# Patient Record
Sex: Female | Born: 1945 | Race: Black or African American | Hispanic: No | Marital: Married | Smoking: Never smoker
Health system: Southern US, Community
[De-identification: ages and names within clinical notes are randomized; demographics above are authoritative.]

## PROBLEM LIST (undated history)

## (undated) DIAGNOSIS — Z860100 Personal history of colon polyps, unspecified: Secondary | ICD-10-CM

## (undated) DIAGNOSIS — G250 Essential tremor: Secondary | ICD-10-CM

## (undated) DIAGNOSIS — H269 Unspecified cataract: Secondary | ICD-10-CM

## (undated) DIAGNOSIS — R7301 Impaired fasting glucose: Secondary | ICD-10-CM

## (undated) DIAGNOSIS — I10 Essential (primary) hypertension: Secondary | ICD-10-CM

## (undated) DIAGNOSIS — M1990 Unspecified osteoarthritis, unspecified site: Secondary | ICD-10-CM

## (undated) DIAGNOSIS — K219 Gastro-esophageal reflux disease without esophagitis: Secondary | ICD-10-CM

## (undated) HISTORY — DX: Personal history of colonic polyps: Z86.010

## (undated) HISTORY — DX: Unspecified osteoarthritis, unspecified site: M19.90

## (undated) HISTORY — PX: TUBAL LIGATION: SHX77

## (undated) HISTORY — DX: Essential tremor: G25.0

## (undated) HISTORY — PX: ABDOMINAL HYSTERECTOMY: SHX81

## (undated) HISTORY — DX: Personal history of colon polyps, unspecified: Z86.0100

## (undated) HISTORY — DX: Impaired fasting glucose: R73.01

## (undated) HISTORY — DX: Essential (primary) hypertension: I10

## (undated) HISTORY — PX: COLONOSCOPY: SHX174

## (undated) HISTORY — DX: Unspecified cataract: H26.9

## (undated) HISTORY — DX: Gastro-esophageal reflux disease without esophagitis: K21.9

---

## 1990-11-03 HISTORY — PX: CERVICAL DISCECTOMY: SHX98

## 2005-02-06 ENCOUNTER — Encounter: Payer: Self-pay | Admitting: Internal Medicine

## 2006-02-24 ENCOUNTER — Encounter: Payer: Self-pay | Admitting: Internal Medicine

## 2006-08-10 ENCOUNTER — Ambulatory Visit: Payer: Self-pay | Admitting: Internal Medicine

## 2006-09-29 ENCOUNTER — Ambulatory Visit: Payer: Self-pay | Admitting: Internal Medicine

## 2006-12-22 ENCOUNTER — Ambulatory Visit: Payer: Self-pay | Admitting: Internal Medicine

## 2006-12-22 LAB — CONVERTED CEMR LAB
Albumin: 3.5 g/dL (ref 3.5–5.2)
BUN: 10 mg/dL (ref 6–23)
CO2: 32 meq/L (ref 19–32)
Chloride: 103 meq/L (ref 96–112)
Creatinine, Ser: 0.8 mg/dL (ref 0.4–1.2)
GFR calc non Af Amer: 77 mL/min
Phosphorus: 3.5 mg/dL (ref 2.3–4.6)

## 2006-12-25 ENCOUNTER — Ambulatory Visit: Payer: Self-pay | Admitting: Internal Medicine

## 2006-12-25 LAB — CONVERTED CEMR LAB
Glucose, Bld: 102 mg/dL — ABNORMAL HIGH (ref 70–99)
Hgb A1c MFr Bld: 5.9 % (ref 4.6–6.0)

## 2007-03-16 ENCOUNTER — Encounter: Payer: Self-pay | Admitting: Internal Medicine

## 2007-03-16 DIAGNOSIS — M159 Polyosteoarthritis, unspecified: Secondary | ICD-10-CM | POA: Insufficient documentation

## 2007-03-16 DIAGNOSIS — I10 Essential (primary) hypertension: Secondary | ICD-10-CM | POA: Insufficient documentation

## 2007-03-23 ENCOUNTER — Ambulatory Visit: Payer: Self-pay | Admitting: Internal Medicine

## 2007-06-01 ENCOUNTER — Encounter: Admission: RE | Admit: 2007-06-01 | Discharge: 2007-06-01 | Payer: Self-pay | Admitting: Internal Medicine

## 2007-06-10 ENCOUNTER — Encounter: Admission: RE | Admit: 2007-06-10 | Discharge: 2007-06-10 | Payer: Self-pay | Admitting: Internal Medicine

## 2007-06-16 ENCOUNTER — Encounter (INDEPENDENT_AMBULATORY_CARE_PROVIDER_SITE_OTHER): Payer: Self-pay | Admitting: *Deleted

## 2007-06-24 ENCOUNTER — Ambulatory Visit: Payer: Self-pay | Admitting: Internal Medicine

## 2007-06-24 LAB — CONVERTED CEMR LAB
Albumin: 3.7 g/dL (ref 3.5–5.2)
BUN: 14 mg/dL (ref 6–23)
Calcium: 9 mg/dL (ref 8.4–10.5)
GFR calc Af Amer: 93 mL/min
GFR calc non Af Amer: 77 mL/min
Phosphorus: 3.5 mg/dL (ref 2.3–4.6)
Potassium: 3.7 meq/L (ref 3.5–5.1)

## 2007-08-05 ENCOUNTER — Ambulatory Visit: Payer: Self-pay | Admitting: Internal Medicine

## 2007-09-24 ENCOUNTER — Ambulatory Visit: Payer: Self-pay | Admitting: Internal Medicine

## 2007-09-24 LAB — CONVERTED CEMR LAB
Albumin: 3.7 g/dL (ref 3.5–5.2)
BUN: 11 mg/dL (ref 6–23)
Creatinine, Ser: 0.8 mg/dL (ref 0.4–1.2)
Creatinine,U: 360.6 mg/dL
GFR calc Af Amer: 93 mL/min
GFR calc non Af Amer: 77 mL/min
Phosphorus: 3.8 mg/dL (ref 2.3–4.6)
Potassium: 3.5 meq/L (ref 3.5–5.1)
Sodium: 142 meq/L (ref 135–145)

## 2007-10-12 ENCOUNTER — Ambulatory Visit: Payer: Self-pay | Admitting: Internal Medicine

## 2007-11-09 ENCOUNTER — Telehealth (INDEPENDENT_AMBULATORY_CARE_PROVIDER_SITE_OTHER): Payer: Self-pay | Admitting: *Deleted

## 2007-12-20 ENCOUNTER — Encounter (INDEPENDENT_AMBULATORY_CARE_PROVIDER_SITE_OTHER): Payer: Self-pay | Admitting: *Deleted

## 2007-12-29 ENCOUNTER — Ambulatory Visit: Payer: Self-pay | Admitting: Internal Medicine

## 2008-03-20 ENCOUNTER — Ambulatory Visit: Payer: Self-pay | Admitting: Internal Medicine

## 2008-07-07 ENCOUNTER — Encounter: Admission: RE | Admit: 2008-07-07 | Discharge: 2008-07-07 | Payer: Self-pay | Admitting: Internal Medicine

## 2008-07-11 ENCOUNTER — Encounter: Payer: Self-pay | Admitting: Internal Medicine

## 2008-07-11 ENCOUNTER — Encounter (INDEPENDENT_AMBULATORY_CARE_PROVIDER_SITE_OTHER): Payer: Self-pay | Admitting: *Deleted

## 2008-08-25 ENCOUNTER — Ambulatory Visit: Payer: Self-pay | Admitting: Internal Medicine

## 2008-08-25 DIAGNOSIS — R7301 Impaired fasting glucose: Secondary | ICD-10-CM

## 2008-08-25 DIAGNOSIS — R7303 Prediabetes: Secondary | ICD-10-CM | POA: Insufficient documentation

## 2008-08-28 LAB — CONVERTED CEMR LAB
ALT: 41 units/L — ABNORMAL HIGH (ref 0–35)
AST: 41 units/L — ABNORMAL HIGH (ref 0–37)
Albumin: 3.8 g/dL (ref 3.5–5.2)
Alkaline Phosphatase: 66 units/L (ref 39–117)
Basophils Absolute: 0 10*3/uL (ref 0.0–0.1)
Basophils Relative: 0.2 % (ref 0.0–3.0)
CO2: 32 meq/L (ref 19–32)
Calcium: 9.2 mg/dL (ref 8.4–10.5)
Cholesterol: 169 mg/dL (ref 0–200)
Creatinine, Ser: 0.8 mg/dL (ref 0.4–1.2)
Glucose, Bld: 96 mg/dL (ref 70–99)
LDL Cholesterol: 98 mg/dL (ref 0–99)
Lymphocytes Relative: 31.4 % (ref 12.0–46.0)
MCHC: 33.8 g/dL (ref 30.0–36.0)
Monocytes Relative: 9.9 % (ref 3.0–12.0)
Neutrophils Relative %: 56.3 % (ref 43.0–77.0)
RBC: 4.4 M/uL (ref 3.87–5.11)
Total Bilirubin: 0.7 mg/dL (ref 0.3–1.2)
Total CHOL/HDL Ratio: 3.4
VLDL: 22 mg/dL (ref 0–40)

## 2008-10-26 ENCOUNTER — Telehealth (INDEPENDENT_AMBULATORY_CARE_PROVIDER_SITE_OTHER): Payer: Self-pay | Admitting: *Deleted

## 2008-11-14 ENCOUNTER — Ambulatory Visit: Payer: Self-pay | Admitting: Internal Medicine

## 2009-03-21 ENCOUNTER — Ambulatory Visit: Payer: Self-pay | Admitting: Internal Medicine

## 2009-03-21 ENCOUNTER — Telehealth: Payer: Self-pay | Admitting: Internal Medicine

## 2009-03-22 LAB — CONVERTED CEMR LAB
ALT: 34 units/L (ref 0–35)
AST: 37 units/L (ref 0–37)
Albumin: 3.9 g/dL (ref 3.5–5.2)
Alkaline Phosphatase: 75 units/L (ref 39–117)
CO2: 30 meq/L (ref 19–32)
Calcium: 9.1 mg/dL (ref 8.4–10.5)
Chloride: 109 meq/L (ref 96–112)
Potassium: 3.3 meq/L — ABNORMAL LOW (ref 3.5–5.1)
Total Protein: 7.6 g/dL (ref 6.0–8.3)

## 2009-04-05 ENCOUNTER — Ambulatory Visit: Payer: Self-pay | Admitting: Internal Medicine

## 2009-04-05 ENCOUNTER — Telehealth: Payer: Self-pay | Admitting: Internal Medicine

## 2009-04-05 LAB — CONVERTED CEMR LAB: Potassium: 4.1 meq/L (ref 3.5–5.1)

## 2009-07-02 ENCOUNTER — Telehealth: Payer: Self-pay | Admitting: Internal Medicine

## 2009-07-12 ENCOUNTER — Encounter: Admission: RE | Admit: 2009-07-12 | Discharge: 2009-07-12 | Payer: Self-pay | Admitting: Internal Medicine

## 2009-07-13 ENCOUNTER — Encounter: Payer: Self-pay | Admitting: Internal Medicine

## 2009-08-15 ENCOUNTER — Ambulatory Visit: Payer: Self-pay | Admitting: Internal Medicine

## 2009-09-19 ENCOUNTER — Ambulatory Visit: Payer: Self-pay | Admitting: Internal Medicine

## 2009-09-25 LAB — CONVERTED CEMR LAB
ALT: 40 units/L — ABNORMAL HIGH (ref 0–35)
AST: 40 units/L — ABNORMAL HIGH (ref 0–37)
Bilirubin, Direct: 0.1 mg/dL (ref 0.0–0.3)
CO2: 32 meq/L (ref 19–32)
Chloride: 104 meq/L (ref 96–112)
Creatinine, Ser: 0.8 mg/dL (ref 0.4–1.2)
Eosinophils Relative: 1.4 % (ref 0.0–5.0)
GFR calc non Af Amer: 92.5 mL/min (ref >60)
HCT: 38.8 % (ref 36.0–46.0)
HDL: 45.9 mg/dL (ref >39.00)
Lymphs Abs: 1.5 10*3/uL (ref 0.7–4.0)
Monocytes Relative: 10.4 % (ref 3.0–12.0)
Platelets: 225 10*3/uL (ref 150.0–400.0)
Sodium: 143 meq/L (ref 135–145)
Total Bilirubin: 0.9 mg/dL (ref 0.3–1.2)
Total CHOL/HDL Ratio: 4
WBC: 4.6 10*3/uL (ref 4.5–10.5)

## 2009-10-23 ENCOUNTER — Ambulatory Visit: Payer: Self-pay | Admitting: Internal Medicine

## 2009-10-23 DIAGNOSIS — E876 Hypokalemia: Secondary | ICD-10-CM

## 2009-10-24 LAB — CONVERTED CEMR LAB: Potassium: 3.5 meq/L (ref 3.5–5.1)

## 2009-11-02 ENCOUNTER — Encounter: Payer: Self-pay | Admitting: Internal Medicine

## 2010-03-12 ENCOUNTER — Encounter: Payer: Self-pay | Admitting: Internal Medicine

## 2010-03-13 ENCOUNTER — Encounter (INDEPENDENT_AMBULATORY_CARE_PROVIDER_SITE_OTHER): Payer: Self-pay | Admitting: *Deleted

## 2010-03-13 ENCOUNTER — Ambulatory Visit: Payer: Self-pay | Admitting: Internal Medicine

## 2010-03-13 DIAGNOSIS — R079 Chest pain, unspecified: Secondary | ICD-10-CM

## 2010-03-15 LAB — CONVERTED CEMR LAB
ALT: 42 units/L — ABNORMAL HIGH (ref 0–35)
AST: 51 units/L — ABNORMAL HIGH (ref 0–37)
Albumin: 3.9 g/dL (ref 3.5–5.2)
BUN: 17 mg/dL (ref 6–23)
Basophils Absolute: 0 10*3/uL (ref 0.0–0.1)
CO2: 32 meq/L (ref 19–32)
Calcium: 9 mg/dL (ref 8.4–10.5)
Creatinine, Ser: 0.8 mg/dL (ref 0.4–1.2)
Eosinophils Relative: 2.8 % (ref 0.0–5.0)
Monocytes Absolute: 0.5 10*3/uL (ref 0.1–1.0)
Monocytes Relative: 10.4 % (ref 3.0–12.0)
Neutrophils Relative %: 53.4 % (ref 43.0–77.0)
Platelets: 220 10*3/uL (ref 150.0–400.0)
TSH: 1.08 microintl units/mL (ref 0.35–5.50)
Total Protein: 6.8 g/dL (ref 6.0–8.3)
WBC: 4.4 10*3/uL — ABNORMAL LOW (ref 4.5–10.5)

## 2010-04-11 ENCOUNTER — Encounter (INDEPENDENT_AMBULATORY_CARE_PROVIDER_SITE_OTHER): Payer: Self-pay

## 2010-04-16 ENCOUNTER — Ambulatory Visit: Payer: Self-pay | Admitting: Internal Medicine

## 2010-04-30 ENCOUNTER — Ambulatory Visit: Payer: Self-pay | Admitting: Internal Medicine

## 2010-05-05 ENCOUNTER — Encounter: Payer: Self-pay | Admitting: Internal Medicine

## 2010-07-16 ENCOUNTER — Encounter: Admission: RE | Admit: 2010-07-16 | Discharge: 2010-07-16 | Payer: Self-pay | Admitting: Internal Medicine

## 2010-07-17 ENCOUNTER — Encounter: Payer: Self-pay | Admitting: Internal Medicine

## 2010-08-07 ENCOUNTER — Ambulatory Visit: Payer: Self-pay | Admitting: Internal Medicine

## 2010-10-01 ENCOUNTER — Ambulatory Visit: Payer: Self-pay | Admitting: Internal Medicine

## 2010-10-02 LAB — CONVERTED CEMR LAB
AST: 36 units/L (ref 0–37)
Alkaline Phosphatase: 57 units/L (ref 39–117)
BUN: 19 mg/dL (ref 6–23)
Basophils Absolute: 0 10*3/uL (ref 0.0–0.1)
Bilirubin, Direct: 0.1 mg/dL (ref 0.0–0.3)
Chloride: 103 meq/L (ref 96–112)
Eosinophils Absolute: 0.1 10*3/uL (ref 0.0–0.7)
GFR calc non Af Amer: 96.37 mL/min (ref >60)
Glucose, Bld: 104 mg/dL — ABNORMAL HIGH (ref 70–99)
Hemoglobin: 12.1 g/dL (ref 12.0–15.0)
Hgb A1c MFr Bld: 5.9 % (ref 4.6–6.5)
Lymphocytes Relative: 35.1 % (ref 12.0–46.0)
Monocytes Relative: 9.5 % (ref 3.0–12.0)
Neutro Abs: 2.4 10*3/uL (ref 1.4–7.7)
Neutrophils Relative %: 51.4 % (ref 43.0–77.0)
Phosphorus: 3.3 mg/dL (ref 2.3–4.6)
Potassium: 3.5 meq/L (ref 3.5–5.1)
RDW: 13.8 % (ref 11.5–14.6)
TSH: 0.58 microintl units/mL (ref 0.35–5.50)
Total Bilirubin: 0.5 mg/dL (ref 0.3–1.2)
Vitamin B-12: 1482 pg/mL — ABNORMAL HIGH (ref 211–911)

## 2010-11-24 ENCOUNTER — Encounter: Payer: Self-pay | Admitting: Internal Medicine

## 2010-12-03 NOTE — Miscellaneous (Signed)
Summary: Lec previsit  Clinical Lists Changes  Medications: Added new medication of MOVIPREP 100 GM  SOLR (PEG-KCL-NACL-NASULF-NA ASC-C) As per prep instructions. - Signed Rx of MOVIPREP 100 GM  SOLR (PEG-KCL-NACL-NASULF-NA ASC-C) As per prep instructions.;  #1 x 0;  Signed;  Entered by: [name] RN;  Authorized by: Iva Boop MD, FACG;  Method used: [facility]  [address], [place], [place]  [address], Ph: [phone] or [phone], Fax: [phone] Observations: Added new observation of NKA: T (04/16/2010 10:30)    Prescriptions: MOVIPREP 100 GM  SOLR (PEG-KCL-NACL-NASULF-NA ASC-C) As per prep instructions.  #1 x 0   Entered by:   [name] RN   Authorized by:   Iva Boop MD, [facility]   Signed by:   [name] RN on 04/16/2010   Method used:   [facility]ronically [facility]        [facility]  [address] (retail)       [address]       [place], [place]  [address]       Ph: [phone] or [phone]       Fax: [phone]   RxID:   [id number]

## 2010-12-03 NOTE — Assessment & Plan Note (Signed)
Summary: FLU SHOT/CLE  Nurse Visit   Allergies: No Known Drug Allergies  Orders Added: 1)  Flu Vaccine 3yrs + MEDICARE PATIENTS [Q2039] 2)  Administration Flu vaccine - MCR [G0008]  Flu Vaccine Consent Questions     Do you have a history of severe allergic reactions to this vaccine? no    Any prior history of allergic reactions to egg and/or gelatin? no    Do you have a sensitivity to the preservative Thimersol? no    Do you have a past history of Guillan-Barre Syndrome? no    Do you currently have an acute febrile illness? no    Have you ever had a severe reaction to latex? no    Vaccine information given and explained to patient? yes    Are you currently pregnant? no    Lot Number:[id number]   Exp Date:05/03/2011   Site Given  Left Deltoid IMu  

## 2010-12-03 NOTE — Medication Information (Signed)
Summary: Letter Regarding ARB & ACE-I Combination Use/[facility]  Letter Regarding ARB & ACE-I Combination Use/[facility]   Imported By: Lanelle Bal 11/07/2009 09:56:45  _____________________________________________________________________  External Attachment:    Type:   Image     Comment:   External Document  Appended Document: Letter Regarding ARB & ACE-I Combination Use/[facility] will discuss stopping the benazapril at next visit if BP is still okay

## 2010-12-03 NOTE — Procedures (Signed)
Summary: Colonoscopy  Patient: Kathy Acosta Note: All result statuses are Final unless otherwise noted.  Tests: (1) Colonoscopy (COL)   COL Colonoscopy           DONE     [facility]     [address]     [place], [place]  [address]           COLONOSCOPY PROCEDURE REPORT           PATIENT:  Kathy, Acosta  MR#:  [id number]     BIRTHDATE:  06-21-1946, 63 yrs. old  GENDER:  female     ENDOSCOPIST:  Iva Boop, MD, [facility]     REF. BY:  Tillman Abide, M.D.     PROCEDURE DATE:  04/30/2010     PROCEDURE:  Colonoscopy with snare polypectomy     ASA CLASS:  Class II     INDICATIONS:  surveillance and high-risk screening, history of     polyps, family history of colon cancer personal hx of diminutive     polyp removal 4/06 (no pathology)     sister(60's), aunt (40-50's) and uncle (27's) all with colon     cancer     MEDICATIONS:   Fentanyl 75 mcg IV, Versed 6 mg IV           DESCRIPTION OF PROCEDURE:   After the risks benefits and     alternatives of the procedure were thoroughly explained, informed     consent was obtained.  Digital rectal exam was performed and     revealed no abnormalities.   The LB [id number] endoscope     was introduced through the anus and advanced to the cecum, which     was identified by both the appendix and ileocecal valve, without     limitations.  The quality of the prep was excellent, using     MoviPrep.  The instrument was then slowly withdrawn as the colon     was fully examined. Insertion: 4:05 minutes and Withdrawal: 10:28     minutes.     <<PROCEDUREIMAGES>>           FINDINGS:  A diminutive polyp was found in the descending colon.     It was 5 mm in size. Polyp was snared without cautery. Retrieval     was successful. snare polyp  Scattered diverticula were found     throughout the colon.  This was otherwise a normal examination of     the colon.   Retroflexed views in the rectum revealed no     abnormalities.    The scope was  then withdrawn from the patient     and the procedure completed.           COMPLICATIONS:  None     ENDOSCOPIC IMPRESSION:     1) 5 mm diminutive polyp in the descending colon - removed     2) Diverticula, scattered throughout the colon     3) Otherwise normal examination - excellent prep     4) Personal history of diminutive polyp removal 2006     5) Family history of colon cancer (sister, aunt and uncle)           REPEAT EXAM:  In for Colonoscopy, pending biopsy results. Likely     in 5 years           Iva Boop, MD, [facility]           CC:  Karie Schwalbe, MD     The Patient           n.     eSIGNED:   Iva Boop at 04/30/2010 11:36 AM           Isabella Bowens, [phone]  Note: An exclamation mark (!) indicates a result that was not dispersed into the flowsheet. Document Creation Date: 04/30/2010 11:36 AM _______________________________________________________________________  (1) Order result status: Final Collection or observation date-time: 04/30/2010 11:26 Requested date-time:  Receipt date-time:  Reported date-time:  Referring Physician:   Ordering Physician: [place] Head [phone]) Specimen Source:  Source: Launa Grill Order Number: [id number] Lab site:   Appended Document: Colonoscopy     Procedures Next Due Date:    Colonoscopy: 04/2015

## 2010-12-03 NOTE — Assessment & Plan Note (Signed)
Summary: CPX/RBH   Vital Signs:  Patient profile:   65 year old female Weight:      166 pounds Temp:     99.0 degrees F oral Pulse rate:   72 / minute Pulse rhythm:   regular BP sitting:   140 / 80  (left arm) Cuff size:   regular  Vitals Entered By: Mervin Hack CMA Duncan Dull) (October 01, 2010 9:50 AM) CC: adult physical   History of Present Illness: DOing well just had colonoscopy Mammo done a few months ago  Still with mild tremor Can be a problem when using hands--but not consistently no functional problems  Allergies: No Known Drug Allergies  Past History:  Past medical, surgical, family and social histories (including risk factors) reviewed for relevance to current acute and chronic problems.  Past Medical History: Reviewed history from 03/13/2010 and no changes required. Hypertension Osteoarthritis Impaired fasting glucose Colonic polyps, hx of  Past Surgical History: Reviewed history from 03/16/2007 and no changes required. Cervical diskectomy 1992 C-section x 2 Hysterectomy 1990's Thallium stress negative EF 69% 12/05 DEXA- mild osteopenia spine 08/01  Family History: Reviewed history from 12/29/2007 and no changes required. Father: Died at age 21, old age, HTN Mother: Died at age 70, HTN Siblings: 8 brothers, one died of leukemia, one died DM complications              4 sisters. 1 with GI stromal cell tumor HBP--  Parents DM-- Dad and 2 brothers Colon cancer-- Pat. aunt No brreast cancer  Social History: Marital Status: widowed 1999 Children: 2 sons, 7 grandchildren Occupation: Retired, Clinical biochemist, [employer] Never Smoked Alcohol use-yes--occ wine Walks 3 miles --3 times weekly  Review of Systems General:  weight down 4#--she wasn't aware No regular exercise sleeps okay in general--occ bad night always wears seat belt. Eyes:  Denies double vision and vision loss-1 eye. ENT:  Denies decreased hearing and ringing in ears;  teeth okay--regular with dentist. CV:  Complains of shortness of breath with exertion; denies chest pain or discomfort, difficulty breathing at night, difficulty breathing while lying down, fainting, lightheadness, and palpitations; stable DOE . Resp:  Denies cough and shortness of breath. GI:  Denies abdominal pain, bloody stools, change in bowel habits, dark tarry stools, indigestion, nausea, and vomiting. GU:  Denies dysuria and incontinence; no problems on self breast exam no sexual problems. MS:  Complains of joint pain; denies joint swelling; some pain in hands--esp right thumb. Derm:  Denies lesion(s) and rash. Neuro:  Complains of tremors and weakness; denies headaches, numbness, and tingling; brief left leg weakness--seemed to be related to hip pain. Psych:  Denies anxiety and depression. Heme:  Complains of abnormal bruising; denies enlarge lymph nodes; bruises easy--no change. Allergy:  Denies seasonal allergies and sneezing; may have some mild symptoms--no meds.  Physical Exam  General:  alert and normal appearance.   Eyes:  pupils equal, pupils round, pupils reactive to light, and no optic disk abnormalities.   Ears:  R ear normal and L ear normal.   Mouth:  no erythema, no exudates, and no lesions.   Neck:  supple, no masses, no thyromegaly, no carotid bruits, and no cervical lymphadenopathy.   Breasts:  no masses, no abnormal thickening, no tenderness, and no adenopathy.   Lungs:  normal respiratory effort, no intercostal retractions, no accessory muscle use, and normal breath sounds.   Heart:  normal rate, regular rhythm, no murmur, and no gallop.   Abdomen:  soft, non-tender,  and no masses.   Msk:  no joint tenderness and no joint swelling.   Pulses:  1+ in feet Extremities:  no edema Neurologic:  alert & oriented X3, strength normal in all extremities, and gait normal.   Skin:  no rashes and no suspicious lesions.   Multiple seb keratoses and several benign  nevi Psych:  normally interactive, good eye contact, not anxious appearing, and not depressed appearing.     Impression & Recommendations:  Problem # 1:  HEALTH MAINTENANCE EXAM (ICD-V70.0) Assessment Comment Only up to date on cancer screening and imms discussed fitness  Problem # 2:  HYPERTENSION (ICD-401.9) Assessment: Unchanged good control no changes needed  Her updated medication list for this problem includes:    Metoprolol Succinate 100 Mg Xr24h-tab (Metoprolol succinate) .Marland Kitchen... 1 daily for high blood pressure    Losartan Potassium-hctz 100-12.5 Mg Tabs (Losartan potassium-hctz) .Marland Kitchen... 1 tab daily for high blood pressure    Doxazosin Mesylate 4 Mg Tabs (Doxazosin mesylate) .Marland Kitchen... Take one by mouth daily    Amlodipine Besy-benazepril Hcl 10-20 Mg Caps (Amlodipine besy-benazepril hcl) .Marland Kitchen... Take one by mouth daily  Orders: TLB-Renal Function Panel (80069-RENAL) TLB-CBC Platelet - w/Differential (85025-CBCD) TLB-Hepatic/Liver Function Pnl (80076-HEPATIC) TLB-TSH (Thyroid Stimulating Hormone) (84443-TSH) Venipuncture ([address])  BP today: 140/80 Prior BP: 128/80 (03/13/2010)  Prior 10 Yr Risk Heart Disease: 8 % (11/14/2008)  Labs Reviewed: K+: 3.3 (03/13/2010) Creat: : 0.8 (03/13/2010)   Chol: 171 (09/19/2009)   HDL: 45.90 (09/19/2009)   LDL: 98 (09/19/2009)   TG: 138.0 (09/19/2009)  Problem # 3:  OSTEOARTHRITIS (ICD-715.90) Assessment: Comment Only discussed tylenol as needed   Problem # 4:  TREMOR (ICD-781.0) Assessment: Comment Only mild and non progressive at this point  Orders: TLB-B12, Serum-Total ONLY (09326-Z12)  Complete Medication List: 1)  Metoprolol Succinate 100 Mg Xr24h-tab (Metoprolol succinate) .Marland Kitchen.. 1 daily for high blood pressure 2)  Losartan Potassium-hctz 100-12.5 Mg Tabs (Losartan potassium-hctz) .Marland Kitchen.. 1 tab daily for high blood pressure 3)  Doxazosin Mesylate 4 Mg Tabs (Doxazosin mesylate) .... Take one by mouth daily 4)  Amlodipine  Besy-benazepril Hcl 10-20 Mg Caps (Amlodipine besy-benazepril hcl) .... Take one by mouth daily 5)  Aspirin 81 Mg Tabs (Aspirin) .... Take one by mouth daily 6)  Vitamin C Cr 500 Mg Cpcr (Ascorbic acid) .... Take one by mouth daily 7)  Oscal 500/200 D-3 500-200 Mg-unit Tabs (Calcium-vitamin d) .... Take one by mouth daily 8)  Glucosamine-chondroitin Tabs (Glucosamine-chondroit-vit c-mn) .... Take 2 by mouth daily 9)  Ultra-mega Cr-tabs (Multiple vitamins-minerals) .... Take 2 by mouth once daily 10)  Fish Oil 1000 Mg Caps (Omega-3 fatty acids) .... Take 1 by mouth once daily  Other Orders: TLB-A1C / Hgb A1C (Glycohemoglobin) (83036-A1C)  Patient Instructions: 1)  Please schedule a follow-up appointment in 6 months .  Prescriptions: AMLODIPINE BESY-BENAZEPRIL HCL 10-20 MG CAPS (AMLODIPINE BESY-BENAZEPRIL HCL) Take one by mouth daily  #90 x 3   Entered by:   Mervin Hack CMA (AAMA)   Authorized by:   Cindee Salt MD   Signed by:   Mervin Hack CMA (AAMA) on 10/01/2010   Method used:   Electronically to        [facility] (retail)             ,          Ph: [phone]       Fax: [phone]   RxID:   [id number] DOXAZOSIN MESYLATE 4 MG TABS (DOXAZOSIN MESYLATE) Take one  by mouth daily  #90 x 3   Entered by:   Mervin Hack CMA (AAMA)   Authorized by:   Cindee Salt MD   Signed by:   Mervin Hack CMA (AAMA) on 10/01/2010   Method used:   Electronically to        [facility] (retail)             ,          Ph: [phone]       Fax: [phone]   RxID:   [id number] LOSARTAN POTASSIUM-HCTZ 100-12.5 MG TABS (LOSARTAN POTASSIUM-HCTZ) 1 tab daily for high blood pressure  #90 x 3   Entered by:   Mervin Hack CMA (AAMA)   Authorized by:   Cindee Salt MD   Signed by:   Mervin Hack CMA (AAMA) on 10/01/2010   Method used:   Electronically to        [facility] (retail)             ,          Ph: [phone]       Fax:  [phone]   RxID:   [id number] METOPROLOL SUCCINATE 100 MG XR24H-TAB (METOPROLOL SUCCINATE) 1 daily for high blood pressure  #90 x 3   Entered by:   Mervin Hack CMA (AAMA)   Authorized by:   Cindee Salt MD   Signed by:   Mervin Hack CMA (AAMA) on 10/01/2010   Method used:   Electronically to        [facility] (retail)             ,          Ph: [phone]       Fax: [phone]   RxID:   [id number]    Orders Added: 1)  TLB-B12, Serum-Total ONLY [25427-C62] 2)  TLB-Renal Function Panel [80069-RENAL] 3)  TLB-CBC Platelet - w/Differential [85025-CBCD] 4)  TLB-Hepatic/Liver Function Pnl [80076-HEPATIC] 5)  TLB-TSH (Thyroid Stimulating Hormone) [84443-TSH] 6)  Venipuncture [36415] 7)  Est. Patient 65& > [99397] 8)  TLB-A1C / Hgb A1C (Glycohemoglobin) [83036-A1C]    Current Allergies (reviewed today): No known allergies

## 2010-12-03 NOTE — Letter (Signed)
Summary: Patient Notice-Hyperplastic Polyps  [facility]  [address] [place], [place] [address]   Phone: [phone]  Fax: [phone]        May 05, 2010 MRN: [id number]    [facility] [address] [place], [place]  [address]    Dear Ms. Srinivasan,  I am pleased to inform you that the suspected colon polyp removed during your recent colonoscopy was not actually a polyp. It was NOT pre-cancerous.  It is therefore my recommendation that you have a repeat colonoscopy examination in 5 years for routine colorectal cancer screening.   Should you develop new or worsening symptoms of abdominal pain, bowel habit changes or bleeding from the rectum or bowels, please schedule an evaluation with either your primary care physician or with me.  Please call us if you are having persistent problems or have questions about your condition that have not been fully answered at this time.   Sincerely,  Iva Boop MD, [facility] This letter has been electronically signed by your physician.  Appended Document: Patient Notice-Hyperplastic Polyps letter mailed 7.6.11

## 2010-12-03 NOTE — Assessment & Plan Note (Signed)
Summary: 6 M F/U DLO   Vital Signs:  Patient profile:   65 year old female Weight:      170 pounds BMI:     30.71 Temp:     98.7 degrees F oral Pulse rate:   64 / minute Pulse rhythm:   regular BP sitting:   128 / 80  (left arm) Cuff size:   regular  Vitals Entered By: Mervin Hack CMA Duncan Dull) (Mar 13, 2010 10:45 AM) CC: 6 month follow-up   History of Present Illness: Feels generally well Right shoulder feels better now  Still gets some left hip pain esp bad with prolonged sitting----takes a while to loosen up takes tylenol or advil if bad--helps Stays active in general--no restrictions "silver sneaker" program and water aerobics  Occ notes shaking of left thumb---if tries to shake someone's hand or holding a paper no other shaking No FH of tremor or Parkinsons  Bp has been good  Did get sharp chest pain about 1 month ago was expecting guests but not nervous was busy cleaning Lasted only a few seconds then eased some fluttering in chest that night no recurrence  Allergies: No Known Drug Allergies  Past History:  Past medical, surgical, family and social histories (including risk factors) reviewed for relevance to current acute and chronic problems.  Past Medical History: Reviewed history from 08/25/2008 and no changes required. Hypertension Osteoarthritis Impaired fasting glucose  Past Surgical History: Reviewed history from 03/16/2007 and no changes required. Cervical diskectomy 1992 C-section x 2 Hysterectomy 1990's Thallium stress negative EF 69% 12/05 DEXA- mild osteopenia spine 08/01  Family History: Reviewed history from 12/29/2007 and no changes required. Father: Died at age 77, old age, HTN Mother: Died at age 85, HTN Siblings: 8 brothers, one died of leukemia, one died DM complications              4 sisters. 1 with GI stromal cell tumor HBP--  Parents DM-- Dad and 2 brothers Colon cancer-- Pat. aunt No brreast cancer  Social  History: Reviewed history from 03/20/2008 and no changes required. Marital Status: widowed 1999 Children: 2 sons, 6 granddaughters Occupation: Retired, Clinical biochemist, Cont. [employer] Never Smoked Alcohol use-yes--occ wine Walks 3 miles --3 times weekly  Review of Systems       sleeps fine appetite is normal weight is stable  Physical Exam  General:  alert and normal appearance.   Neck:  supple, no masses, no thyromegaly, no carotid bruits, and no cervical lymphadenopathy.   Lungs:  normal respiratory effort and normal breath sounds.   Heart:  normal rate, regular rhythm, no murmur, and no gallop.   Abdomen:  soft and non-tender.   Msk:  no joint tenderness and no joint swelling.   Extremities:  no edema Neurologic:  slight tremor of left thumb when holding paper normal tone Psych:  normally interactive, good eye contact, not anxious appearing, and not depressed appearing.     Impression & Recommendations:  Problem # 1:  CHEST PAIN (ICD-786.50) Assessment New  very atypical  EKG not worrisome discussed typical symtoms  Orders: TLB-TSH (Thyroid Stimulating Hormone) (84443-TSH) TLB-CBC Platelet - w/Differential (85025-CBCD) EKG w/ Interpretation (93000)  Problem # 2:  TREMOR (ICD-781.0) Assessment: New very mild and focal will just observe  Problem # 3:  HYPERTENSION (ICD-401.9) Assessment: Unchanged  good control no changes needed  Her updated medication list for this problem includes:    Metoprolol Succinate 100 Mg Xr24h-tab (Metoprolol succinate) .Marland Kitchen... 1 daily for high  blood pressure    Losartan Potassium-hctz 100-12.5 Mg Tabs (Losartan potassium-hctz) .Marland Kitchen... 1 tab daily for high blood pressure    Doxazosin Mesylate 4 Mg Tabs (Doxazosin mesylate) .Marland Kitchen... Take one by mouth daily    Amlodipine Besy-benazepril Hcl 10-20 Mg Caps (Amlodipine besy-benazepril hcl) .Marland Kitchen... Take one by mouth daily  BP today: 128/80 Prior BP: 130/80 (09/19/2009)  Prior 10 Yr Risk  Heart Disease: 8 % (11/14/2008)  Labs Reviewed: K+: 3.5 (10/23/2009) Creat: : 0.8 (09/19/2009)   Chol: 171 (09/19/2009)   HDL: 45.90 (09/19/2009)   LDL: 98 (09/19/2009)   TG: 138.0 (09/19/2009)  Orders: TLB-Hepatic/Liver Function Pnl (80076-HEPATIC) Venipuncture ([address])  Complete Medication List: 1)  Metoprolol Succinate 100 Mg Xr24h-tab (Metoprolol succinate) .Marland Kitchen.. 1 daily for high blood pressure 2)  Losartan Potassium-hctz 100-12.5 Mg Tabs (Losartan potassium-hctz) .Marland Kitchen.. 1 tab daily for high blood pressure 3)  Doxazosin Mesylate 4 Mg Tabs (Doxazosin mesylate) .... Take one by mouth daily 4)  Amlodipine Besy-benazepril Hcl 10-20 Mg Caps (Amlodipine besy-benazepril hcl) .... Take one by mouth daily 5)  Aspirin 81 Mg Tabs (Aspirin) .... Take one by mouth daily 6)  Vitamin C Cr 500 Mg Cpcr (Ascorbic acid) .... Take one by mouth daily 7)  Oscal 500/200 D-3 500-200 Mg-unit Tabs (Calcium-vitamin d) .... Take one by mouth daily 8)  Glucosamine-chondroitin Tabs (Glucosamine-chondroit-vit c-mn) .... Take 2 by mouth daily 9)  Ultra-mega Cr-tabs (Multiple vitamins-minerals) .... Take 2 by mouth once daily 10)  Fish Oil 1000 Mg Caps (Omega-3 fatty acids) .... Take 1 by mouth once daily  Other Orders: TLB-Renal Function Panel (80069-RENAL)  Patient Instructions: 1)  Please schedule a follow-up appointment in 6 months for physical  Current Allergies (reviewed today): No known allergies    EKG  Procedure date:  03/13/2010  Findings:      sinus @68  Non specific T wave changes since 2007, mild lateral T wave flattening   Appended Document: Orders Update    Clinical Lists Changes  Problems: Added new problem of COLONIC POLYPS, HX OF (ICD-V12.72) Orders: Added new Referral order of Gastroenterology Referral (GI) - Signed Observations: Added new observation of PAST MED HX: Hypertension Osteoarthritis Impaired fasting glucose Colonic polyps, hx of  (03/13/2010 12:15) Added new  observation of PMH COLONPYP: yes (03/13/2010 12:15)       Past History:  Past Medical History: Hypertension Osteoarthritis Impaired fasting glucose Colonic polyps, hx of

## 2010-12-03 NOTE — Letter (Signed)
Summary: Previsit letter  [facility]  [address] [place], [place] [address]   Phone: [phone]  Fax: [phone]       03/13/2010 MRN: [id number]  [facility] [address] [place], [place]  [address]  Dear Kathy Acosta,  Welcome to the Gastroenterology Division at [facility].    You are scheduled to see a nurse for your pre-procedure visit on 04-16-10 at 10:30A.M. on the 3rd floor at [facility], [address]  We ask that you try to arrive at our office 15 minutes prior to your appointment time to allow for check-in.  Your nurse visit will consist of discussing your medical and surgical history, your immediate family medical history, and your medications.    Please bring a complete list of all your medications or, if you prefer, bring the medication bottles and we will list them.  We will need to be aware of both prescribed and over the counter drugs.  We will need to know exact dosage information as well.  If you are on blood thinners (Coumadin, Plavix, Aggrenox, Ticlid, etc.) please call our office today/prior to your appointment, as we need to consult with your physician about holding your medication.   Please be prepared to read and sign documents such as consent forms, a financial agreement, and acknowledgement forms.  If necessary, and with your consent, a friend or relative is welcome to sit-in on the nurse visit with you.  Please bring your insurance card so that we may make a copy of it.  If your insurance requires a referral to see a specialist, please bring your referral form from your primary care physician.  No co-pay is required for this nurse visit.     If you cannot keep your appointment, please call [phone] to cancel or reschedule prior to your appointment date.  This allows [place] the opportunity to schedule an appointment for another patient in need of care.    Thank you for choosing [facility] for your medical  needs.  We appreciate the opportunity to care for you.  Please visit [place] at our website  to learn more about our practice.                     Sincerely.                                                                                                                   The Gastroenterology Division

## 2010-12-03 NOTE — Letter (Signed)
Summary: Results Follow up Letter  [place] [facility]  [address] [place], [place] [address]   Phone: [phone]  Fax: [phone]    07/17/2010 MRN: [id number]  [facility] [address] [place], [place]  [address]  Dear Ms. Kahl,  The following are the results of your recent test(s):  Test         Result    Pap Smear:        Normal _____  Not Normal _____ Comments: ______________________________________________________ Cholesterol: LDL(Bad cholesterol):         Your goal is less than:         HDL (Good cholesterol):       Your goal is more than: Comments:  ______________________________________________________ Mammogram:        Normal __X___  Not Normal _____ Comments:Mammo is fine, repeat recommended in 1-2 years  ___________________________________________________________________ Hemoccult:        Normal _____  Not normal _______ Comments:    _____________________________________________________________________ Other Tests:    We routinely do not discuss normal results over the telephone.  If you desire a copy of the results, or you have any questions about this information we can discuss them at your next office visit.   Sincerely,      Tillman Abide, MD

## 2010-12-03 NOTE — Letter (Signed)
Summary: [facility] Instructions  [facility]  [address] [place], [place] [address]   Phone: [phone]  Fax: [phone]       Kathy Acosta    Jul 19, 1946    MRN: [id number]        Procedure Day Dorna Bloom:  Jake Shark  04/30/10     Arrival Time:  9:30AM     Procedure Time:  10:30AM     Location of Procedure:                    _X _  [facility] (4th Floor)                        PREPARATION FOR COLONOSCOPY WITH MOVIPREP   Starting 5 days prior to your procedure 04/25/10 do not eat nuts, seeds, popcorn, corn, beans, peas,  salads, or any raw vegetables.  Do not take any fiber supplements (e.g. Metamucil, Citrucel, and Benefiber).  THE DAY BEFORE YOUR PROCEDURE         DATE: 04/29/10  DAY: MONDAY  1.  Drink clear liquids the entire day-NO SOLID FOOD  2.  Do not drink anything colored red or purple.  Avoid juices with pulp.  No orange juice.  3.  Drink at least 64 oz. (8 glasses) of fluid/clear liquids during the day to prevent dehydration and help the prep work efficiently.  CLEAR LIQUIDS INCLUDE: Water Jello Ice Popsicles Tea (sugar ok, no milk/cream) Powdered fruit flavored drinks Coffee (sugar ok, no milk/cream) Gatorade Juice: apple, white grape, white cranberry  Lemonade Clear bullion, consomm, broth Carbonated beverages (any kind) Strained chicken noodle soup Hard Candy                             4.  In the morning, mix first dose of MoviPrep solution:    Empty 1 Pouch A and 1 Pouch B into the disposable container    Add lukewarm drinking water to the top line of the container. Mix to dissolve    Refrigerate (mixed solution should be used within 24 hrs)  5.  Begin drinking the prep at 5:00 p.m. The MoviPrep container is divided by 4 marks.   Every 15 minutes drink the solution down to the next mark (approximately 8 oz) until the full liter is complete.   6.  Follow completed prep with 16 oz of clear liquid of your choice (Nothing  red or purple).  Continue to drink clear liquids until bedtime.  7.  Before going to bed, mix second dose of MoviPrep solution:    Empty 1 Pouch A and 1 Pouch B into the disposable container    Add lukewarm drinking water to the top line of the container. Mix to dissolve    Refrigerate  THE DAY OF YOUR PROCEDURE      DATE: 04/30/10  DAY: TUESDAY  Beginning at 5:30AM (5 hours before procedure):         1. Every 15 minutes, drink the solution down to the next mark (approx 8 oz) until the full liter is complete.  2. Follow completed prep with 16 oz. of clear liquid of your choice.    3. You may drink clear liquids until 8:30AM (2 HOURS BEFORE PROCEDURE).   MEDICATION INSTRUCTIONS  Unless otherwise instructed, you should take regular prescription medications with a small sip of water   as early as possible the morning  of your procedure.      Additional medication instructions: Do not take Losartan am of procedure.         OTHER INSTRUCTIONS  You will need a responsible adult at least 67 years of age to accompany you and drive you home.   This person must remain in the waiting room during your procedure.  Wear loose fitting clothing that is easily removed.  Leave jewelry and other valuables at home.  However, you may wish to bring a book to read or  an iPod/MP3 player to listen to music as you wait for your procedure to start.  Remove all body piercing jewelry and leave at home.  Total time from sign-in until discharge is approximately 2-3 hours.  You should go home directly after your procedure and rest.  You can resume normal activities the  day after your procedure.  The day of your procedure you should not:   Drive   Make legal decisions   Operate machinery   Drink alcohol   Return to work  You will receive specific instructions about eating, activities and medications before you leave.    The above instructions have been reviewed and explained to me by    [name] RN  April 16, 2010 11:01 am    I fully understand and can verbalize these instructions _____________________________ Date _________

## 2011-03-26 ENCOUNTER — Encounter: Payer: Self-pay | Admitting: Internal Medicine

## 2011-04-01 ENCOUNTER — Encounter: Payer: Self-pay | Admitting: Internal Medicine

## 2011-04-01 ENCOUNTER — Ambulatory Visit (INDEPENDENT_AMBULATORY_CARE_PROVIDER_SITE_OTHER): Payer: MEDICARE | Admitting: Internal Medicine

## 2011-04-01 VITALS — BP 138/80 | HR 66 | Temp 98.6°F | Ht 62.0 in | Wt 168.0 lb

## 2011-04-01 DIAGNOSIS — R259 Unspecified abnormal involuntary movements: Secondary | ICD-10-CM

## 2011-04-01 DIAGNOSIS — M1990 Unspecified osteoarthritis, unspecified site: Secondary | ICD-10-CM

## 2011-04-01 DIAGNOSIS — I10 Essential (primary) hypertension: Secondary | ICD-10-CM

## 2011-04-01 NOTE — Progress Notes (Signed)
Subjective:    Patient ID: Kathy Acosta, female    DOB: 25-Sep-1946, 65 y.o.   MRN: [id number]  HPI Doing okay Generally feels great Has had a little headache over the past week though SOme pain when moving eyes No pollen sensitivity in general Has been very busy over the past month---up and back from [place] for grandaughter's graduation, etc No fever or cough  Still has mild tremor No progression Has gotten used to it No functional limitations  BP has been fine Regular with her meds No chest pain No SOB No sig exercise---will start for a while and then get sidetracked  Some left hip pain Occ takes tylenol but rarely Stays busy   Current outpatient prescriptions:amLODipine-benazepril (LOTREL) 10-20 MG per capsule, Take 1 capsule by mouth daily.  , Disp: , Rfl: ;  ascorbic acid (VITAMIN C) 500 MG tablet, Take 500 mg by mouth daily.  , Disp: , Rfl: ;  aspirin 81 MG tablet, Take 81 mg by mouth daily.  , Disp: , Rfl: ;  calcium-vitamin D (OSCAL WITH D) 500-200 MG-UNIT per tablet, Take 1 tablet by mouth daily.  , Disp: , Rfl:  doxazosin (CARDURA) 4 MG tablet, Take 4 mg by mouth at bedtime.  , Disp: , Rfl: ;  fish oil-omega-3 fatty acids 1000 MG capsule, Take 1 g by mouth daily.  , Disp: , Rfl: ;  glucosamine-chondroitin 500-400 MG tablet, Take 2 tablets by mouth daily.  , Disp: , Rfl: ;  losartan-hydrochlorothiazide (HYZAAR) 100-12.5 MG per tablet, Take 1 tablet by mouth daily.  , Disp: , Rfl:  metoprolol (TOPROL-XL) 100 MG 24 hr tablet, Take 100 mg by mouth daily.  , Disp: , Rfl: ;  Multiple Vitamins-Minerals (ULTRA MEGA PO), Take 2 capsules by mouth daily.  , Disp: , Rfl:   Past Medical History  Diagnosis Date  . Hypertension   . Arthritis   . Hx of colonic polyps   . Impaired fasting glucose     Past Surgical History  Procedure Date  . Cesarean section   . Abdominal hysterectomy   . Cervical discectomy 1992    Family History  Problem Relation Age of Onset  .  Hypertension Mother   . Hypertension Father   . Diabetes Father   . Diabetes Brother   . Cancer Paternal Aunt     colon  . Diabetes Brother     History   Social History  . Marital Status: Widowed    Spouse Name: N/A    Number of Children: 2  . Years of Education: N/A   Occupational History  . retired- Geologist, engineering. [employer]    Social History Main Topics  . Smoking status: Never Smoker   . Smokeless tobacco: Not on file  . Alcohol Use: Yes     wine  . Drug Use: Not on file  . Sexually Active: Not on file   Other Topics Concern  . Not on file   Social History Narrative   Walks 3 miles, 3 times weekly   Review of Systems Sleeps well Appetite is fine Weight is stable    Objective:   Physical Exam  Constitutional: She appears well-developed and well-nourished. No distress.  Neck: Normal range of motion. Neck supple. No thyromegaly present.  Cardiovascular: Normal rate, regular rhythm, normal heart sounds and intact distal pulses.  Exam reveals no gallop.   No murmur heard. Pulmonary/Chest: Effort normal and breath sounds normal. No respiratory distress. She has no wheezes. She  has no rales.  Abdominal: Soft. There is no tenderness.  Musculoskeletal: Normal range of motion. She exhibits no edema and no tenderness.  Lymphadenopathy:    She has no cervical adenopathy.  Neurological:       Mild resting tremor without increase with movement ??very slight bradykinesia Normal tone and strength  Psychiatric: She has a normal mood and affect. Her behavior is normal. Judgment and thought content normal.          Assessment & Plan:

## 2011-05-01 ENCOUNTER — Telehealth: Payer: Self-pay | Admitting: *Deleted

## 2011-05-01 NOTE — Telephone Encounter (Signed)
Left message on cell phone voicemail, home number just rang.   Received form from [facility] asking for a tier exception for Amlodipine/Benazepril combo drug, per Dr.Letvak he thought we had split the two up, if not we need to send in 2 separate rx's. I looked at patient's last OV on 04/01/11 and it was not split up, still listed as the combo drug. Waiting on patient to return my call.

## 2011-05-06 NOTE — Telephone Encounter (Signed)
Spoke with patient and she states it's ok to separate the medications, form on your desk. I didn't change in med list, because I wasn't sure about the dose and strength.

## 2011-05-07 NOTE — Telephone Encounter (Signed)
Okay to fill new meds at the same dosages as in the combo agent

## 2011-05-08 MED ORDER — BENAZEPRIL HCL 20 MG PO TABS: 20.0000 mg | ORAL_TABLET | Freq: Every day | ORAL | Status: DC

## 2011-05-08 MED ORDER — AMLODIPINE BESYLATE 10 MG PO TABS: 10.0000 mg | ORAL_TABLET | Freq: Every day | ORAL | Status: DC

## 2011-05-08 NOTE — Telephone Encounter (Signed)
Both rx's sent to [facility]

## 2011-06-20 ENCOUNTER — Other Ambulatory Visit: Payer: Self-pay | Admitting: Internal Medicine

## 2011-06-20 DIAGNOSIS — Z1231 Encounter for screening mammogram for malignant neoplasm of breast: Secondary | ICD-10-CM

## 2011-07-22 ENCOUNTER — Ambulatory Visit: Payer: MEDICARE

## 2011-07-28 ENCOUNTER — Ambulatory Visit
Admission: RE | Admit: 2011-07-28 | Discharge: 2011-07-28 | Disposition: A | Discharge status: Home / Self Care | Payer: Medicare Other | Source: Ambulatory Visit | Attending: Internal Medicine | Admitting: Internal Medicine

## 2011-07-28 DIAGNOSIS — Z1231 Encounter for screening mammogram for malignant neoplasm of breast: Secondary | ICD-10-CM

## 2011-08-01 ENCOUNTER — Encounter: Payer: Self-pay | Admitting: *Deleted

## 2011-09-04 ENCOUNTER — Ambulatory Visit (INDEPENDENT_AMBULATORY_CARE_PROVIDER_SITE_OTHER): Payer: Medicare Other

## 2011-09-04 DIAGNOSIS — Z23 Encounter for immunization: Secondary | ICD-10-CM

## 2011-10-07 ENCOUNTER — Encounter: Payer: Self-pay | Admitting: Internal Medicine

## 2011-10-07 ENCOUNTER — Ambulatory Visit (INDEPENDENT_AMBULATORY_CARE_PROVIDER_SITE_OTHER): Payer: Medicare Other | Admitting: Internal Medicine

## 2011-10-07 VITALS — BP 138/84 | HR 68 | Temp 98.2°F | Ht 62.0 in | Wt 172.0 lb

## 2011-10-07 DIAGNOSIS — Z0000 Encounter for general adult medical examination without abnormal findings: Secondary | ICD-10-CM | POA: Insufficient documentation

## 2011-10-07 DIAGNOSIS — R7301 Impaired fasting glucose: Secondary | ICD-10-CM

## 2011-10-07 DIAGNOSIS — M719 Bursopathy, unspecified: Secondary | ICD-10-CM

## 2011-10-07 DIAGNOSIS — I10 Essential (primary) hypertension: Secondary | ICD-10-CM

## 2011-10-07 DIAGNOSIS — M7552 Bursitis of left shoulder: Secondary | ICD-10-CM | POA: Insufficient documentation

## 2011-10-07 LAB — BASIC METABOLIC PANEL
BUN: 20 mg/dL (ref 6–23)
Calcium: 9.3 mg/dL (ref 8.4–10.5)
Creatinine, Ser: 0.9 mg/dL (ref 0.4–1.2)
GFR: 79.22 mL/min (ref >60.00)
Potassium: 3.3 mEq/L — ABNORMAL LOW (ref 3.5–5.1)

## 2011-10-07 LAB — LIPID PANEL
Cholesterol: 158 mg/dL (ref 0–200)
HDL: 57.1 mg/dL (ref >39.00)
LDL Cholesterol: 78 mg/dL (ref 0–99)
VLDL: 22.8 mg/dL (ref 0.0–40.0)

## 2011-10-07 LAB — CBC WITH DIFFERENTIAL/PLATELET
Basophils Absolute: 0 10*3/uL (ref 0.0–0.1)
Eosinophils Relative: 2.1 % (ref 0.0–5.0)
HCT: 37.9 % (ref 36.0–46.0)
Lymphocytes Relative: 35.6 % (ref 12.0–46.0)
Monocytes Relative: 10.5 % (ref 3.0–12.0)
Neutrophils Relative %: 51.2 % (ref 43.0–77.0)
Platelets: 213 10*3/uL (ref 150.0–400.0)
WBC: 4.6 10*3/uL (ref 4.5–10.5)

## 2011-10-07 LAB — HEPATIC FUNCTION PANEL
AST: 38 U/L — ABNORMAL HIGH (ref 0–37)
Alkaline Phosphatase: 57 U/L (ref 39–117)
Bilirubin, Direct: 0.1 mg/dL (ref 0.0–0.3)
Total Bilirubin: 0.4 mg/dL (ref 0.3–1.2)

## 2011-10-07 MED ORDER — METOPROLOL SUCCINATE ER 100 MG PO TB24: 100.0000 mg | ORAL_TABLET | Freq: Every day | ORAL | Status: DC

## 2011-10-07 MED ORDER — LOSARTAN POTASSIUM-HCTZ 100-12.5 MG PO TABS: 1.0000 | ORAL_TABLET | Freq: Every day | ORAL | Status: DC

## 2011-10-07 MED ORDER — DOXAZOSIN MESYLATE 4 MG PO TABS: 4.0000 mg | ORAL_TABLET | Freq: Every day | ORAL | Status: DC

## 2011-10-07 NOTE — Assessment & Plan Note (Signed)
Doing okay But out of shape Discussed fitness UTD with imms and cancer screening

## 2011-10-07 NOTE — Assessment & Plan Note (Signed)
With apparent arthritis also Discussed ice eval by Dr Patsy Lager if not improving soon

## 2011-10-07 NOTE — Patient Instructions (Signed)
Please call for an evaluation [place] in the next few weeks if your shoulder is not better

## 2011-10-07 NOTE — Assessment & Plan Note (Signed)
BP Readings from Last 3 Encounters:  10/07/11 138/84  04/01/11 138/80  10/01/10 140/80   Acceptable control Due for labs

## 2011-10-07 NOTE — Progress Notes (Signed)
Subjective:    Patient ID: Kathy Acosta, female    DOB: 08/29/1946, 65 y.o.   MRN: [id number]  HPI Doing okay Has had left shoulder pain for the past 2 weeks Notices cracking when she abducts shoulder No known injury Used heat and tylenol---?some help No specific limitations   Also has some left hip pain Diagnosed with arthritis  Not exercising Weight is up a few pounds  Doesn't check BP  Current Outpatient Prescriptions on File Prior to Visit  Medication Sig Dispense Refill  . amLODipine (NORVASC) 10 MG tablet Take 1 tablet (10 mg total) by mouth daily.  90 tablet  3  . ascorbic acid (VITAMIN C) 500 MG tablet Take 500 mg by mouth daily.        Marland Kitchen aspirin 81 MG tablet Take 81 mg by mouth daily.        . benazepril (LOTENSIN) 20 MG tablet Take 1 tablet (20 mg total) by mouth daily.  90 tablet  3  . calcium-vitamin D (OSCAL WITH D) 500-200 MG-UNIT per tablet Take 1 tablet by mouth daily.        . fish oil-omega-3 fatty acids 1000 MG capsule Take 1 g by mouth daily.        Marland Kitchen glucosamine-chondroitin 500-400 MG tablet Take 2 tablets by mouth daily.        . Multiple Vitamins-Minerals (ULTRA MEGA PO) Take 2 capsules by mouth daily.          No Known Allergies  Past Medical History  Diagnosis Date  . Hypertension   . Arthritis   . Hx of colonic polyps   . Impaired fasting glucose     Past Surgical History  Procedure Date  . Cesarean section   . Abdominal hysterectomy   . Cervical discectomy 1992    Family History  Problem Relation Age of Onset  . Hypertension Mother   . Hypertension Father   . Diabetes Father   . Diabetes Brother   . Cancer Paternal Aunt     colon  . Diabetes Brother     History   Social History  . Marital Status: Widowed    Spouse Name: N/A    Number of Children: 2  . Years of Education: N/A   Occupational History  . retired- Geologist, engineering. [employer]    Social History Main Topics  . Smoking status: Never Smoker   . Smokeless  tobacco: Never Used  . Alcohol Use: Yes     wine  . Drug Use: Not on file  . Sexually Active: Not on file   Other Topics Concern  . Not on file   Social History Narrative   Walks occasionallyNo living willRequests sons to be health care POAWould like attempts at resuscitation.Not sure about tube feeds   Review of Systems  Constitutional: Positive for unexpected weight change. Negative for fatigue.       Wears seat belt  HENT: Negative for hearing loss, congestion, rhinorrhea, dental problem and tinnitus.        Regular with dentist  Eyes: Negative for visual disturbance.       No unilateral vision loss or diplopia  Respiratory: Negative for cough, chest tightness and shortness of breath.   Cardiovascular: Positive for leg swelling. Negative for chest pain and palpitations.       Gets edema when travels with prolonged sitting  Gastrointestinal: Negative for nausea, vomiting, abdominal pain, constipation and blood in stool.       No heartburn  Genitourinary: Negative for urgency, frequency and difficulty urinating.       No sexual problems  Musculoskeletal: Positive for arthralgias. Negative for back pain and joint swelling.  Skin: Negative for rash.       No suspicious lesions  Neurological: Negative for dizziness, syncope, weakness, light-headedness, numbness and headaches.  Hematological: Negative for adenopathy. Bruises/bleeds easily.  Psychiatric/Behavioral: Negative for sleep disturbance and dysphoric mood. The patient is not nervous/anxious.        Objective:   Physical Exam  Constitutional: She is oriented to person, place, and time. She appears well-developed and well-nourished. No distress.  HENT:  Head: Normocephalic and atraumatic.  Right Ear: External ear normal.  Left Ear: External ear normal.  Mouth/Throat: Oropharynx is clear and moist. No oropharyngeal exudate.       TMs normal  Eyes: Conjunctivae and EOM are normal. Pupils are equal, round, and reactive to  light.       Fundi benign  Neck: Normal range of motion. Neck supple. No thyromegaly present.  Cardiovascular: Normal rate, regular rhythm, normal heart sounds and intact distal pulses.  Exam reveals no gallop.   No murmur heard. Pulmonary/Chest: Effort normal and breath sounds normal. No respiratory distress. She has no wheezes. She has no rales.  Abdominal: Soft. There is no tenderness.  Genitourinary:       Mild cystic changes R>L breast  Musculoskeletal: She exhibits no edema.       Left shoulder with loud cracking Good passive and active abduction Some decrease in passive internal rotation Tenderness over bursa  Lymphadenopathy:    She has no cervical adenopathy.    She has no axillary adenopathy.  Neurological: She is alert and oriented to person, place, and time.  Skin: Skin is warm. No rash noted. No erythema.  Psychiatric: She has a normal mood and affect. Her behavior is normal. Judgment and thought content normal.          Assessment & Plan:

## 2011-10-08 LAB — TSH: TSH: 1.51 u[IU]/mL (ref 0.35–5.50)

## 2011-11-11 ENCOUNTER — Ambulatory Visit (INDEPENDENT_AMBULATORY_CARE_PROVIDER_SITE_OTHER): Payer: Medicare Other | Admitting: Internal Medicine

## 2011-11-11 ENCOUNTER — Encounter: Payer: Self-pay | Admitting: Internal Medicine

## 2011-11-11 DIAGNOSIS — J069 Acute upper respiratory infection, unspecified: Secondary | ICD-10-CM

## 2011-11-11 DIAGNOSIS — L989 Disorder of the skin and subcutaneous tissue, unspecified: Secondary | ICD-10-CM

## 2011-11-11 MED ORDER — TRIAMCINOLONE ACETONIDE 0.025 % EX OINT: TOPICAL_OINTMENT | Freq: Two times a day (BID) | CUTANEOUS | Status: AC | PRN

## 2011-11-11 NOTE — Assessment & Plan Note (Signed)
Doesn't appear to be fungal Entire scalp is dry Discussed trying cortisone lotion and proceeding with derm eval if persists

## 2011-11-11 NOTE — Assessment & Plan Note (Signed)
Just seems to be viral infection Discussed supportive care

## 2011-11-11 NOTE — Patient Instructions (Signed)
Please try the ointment on your scalp one to two times daily for the next 1-2 weeks. If not better, will need to see a dermatologist

## 2011-11-11 NOTE — Progress Notes (Signed)
Subjective:    Patient ID: Kathy Acosta, female    DOB: 1946/06/02, 66 y.o.   MRN: [id number]  HPI Has an area in scalp she is concerned about Lost hair and it is itchy at times Started a month ago or so No dry skin or dandruff problems Gets conditioner Rx every 2 weeks at beauty shop  Started with cold 3-4 days ago Started cold med with tylenol and chlorpheniramine--not much help No sig sore throat Nasal congestion and sneezing Clear rhinorrhea Not much cough No fever No SOB  Current Outpatient Prescriptions on File Prior to Visit  Medication Sig Dispense Refill  . amLODipine (NORVASC) 10 MG tablet Take 1 tablet (10 mg total) by mouth daily.  90 tablet  3  . ascorbic acid (VITAMIN C) 500 MG tablet Take 500 mg by mouth daily.        Marland Kitchen aspirin 81 MG tablet Take 81 mg by mouth daily.        . benazepril (LOTENSIN) 20 MG tablet Take 1 tablet (20 mg total) by mouth daily.  90 tablet  3  . calcium-vitamin D (OSCAL WITH D) 500-200 MG-UNIT per tablet Take 1 tablet by mouth daily.        Marland Kitchen doxazosin (CARDURA) 4 MG tablet Take 1 tablet (4 mg total) by mouth at bedtime.  90 tablet  3  . fish oil-omega-3 fatty acids 1000 MG capsule Take 1 g by mouth daily.        Marland Kitchen glucosamine-chondroitin 500-400 MG tablet Take 2 tablets by mouth daily.        Marland Kitchen losartan-hydrochlorothiazide (HYZAAR) 100-12.5 MG per tablet Take 1 tablet by mouth daily.  90 tablet  3  . metoprolol (TOPROL-XL) 100 MG 24 hr tablet Take 1 tablet (100 mg total) by mouth daily.  90 tablet  3  . Multiple Vitamins-Minerals (ULTRA MEGA PO) Take 2 capsules by mouth daily.          No Known Allergies  Past Medical History  Diagnosis Date  . Hypertension   . Arthritis   . Hx of colonic polyps   . Impaired fasting glucose     Past Surgical History  Procedure Date  . Cesarean section   . Abdominal hysterectomy   . Cervical discectomy 1992    Family History  Problem Relation Age of Onset  . Hypertension Mother   .  Hypertension Father   . Diabetes Father   . Diabetes Brother   . Cancer Paternal Aunt     colon  . Diabetes Brother     History   Social History  . Marital Status: Widowed    Spouse Name: N/A    Number of Children: 2  . Years of Education: N/A   Occupational History  . retired- Geologist, engineering. [employer]    Social History Main Topics  . Smoking status: Never Smoker   . Smokeless tobacco: Never Used  . Alcohol Use: Yes     wine  . Drug Use: Not on file  . Sexually Active: Not on file   Other Topics Concern  . Not on file   Social History Narrative   Walks occasionallyNo living willRequests sons to be health care POAWould like attempts at resuscitation.Not sure about tube feeds   Review of Systems No vomiting or diarrhea Appetite is okay     Objective:   Physical Exam  Constitutional: She appears well-developed and well-nourished. No distress.  HENT:  Mouth/Throat: Oropharynx is clear and moist. No  oropharyngeal exudate.       TMs normal Mild nasal congestion  Neck: Normal range of motion. Neck supple.  Pulmonary/Chest: Effort normal and breath sounds normal. No respiratory distress. She has no wheezes. She has no rales.  Lymphadenopathy:    She has no cervical adenopathy.  Skin:       Small area of hair loss just above forehead, just left of midline No broken hairs  Skin is dry there and throughout scalp Doesn't fluoresce          Assessment & Plan:

## 2012-02-11 ENCOUNTER — Telehealth: Payer: Self-pay | Admitting: Internal Medicine

## 2012-02-11 ENCOUNTER — Encounter: Payer: Self-pay | Admitting: Internal Medicine

## 2012-02-11 ENCOUNTER — Ambulatory Visit (INDEPENDENT_AMBULATORY_CARE_PROVIDER_SITE_OTHER): Payer: Medicare Other | Admitting: Internal Medicine

## 2012-02-11 VITALS — BP 126/80 | HR 87 | Temp 97.9°F | Wt 170.0 lb

## 2012-02-11 DIAGNOSIS — J019 Acute sinusitis, unspecified: Secondary | ICD-10-CM

## 2012-02-11 MED ORDER — AMOXICILLIN 500 MG PO TABS: 1000.0000 mg | ORAL_TABLET | Freq: Two times a day (BID) | ORAL | Status: AC

## 2012-02-11 NOTE — Assessment & Plan Note (Signed)
Seems to have secondary bacterial infection Discussed supportive care Will Rx amoxil Switch next week if not better

## 2012-02-11 NOTE — Telephone Encounter (Signed)
Seen today. 

## 2012-02-11 NOTE — Telephone Encounter (Signed)
Triage Record Num: [id number] Operator: Durward Mallard [facility] Patient Name: Kathy Acosta Call Date & Time: 02/11/2012 9:27:36AM Patient Phone: [phone] PCP: Tillman Abide Patient Gender: Female PCP Fax : [phone] Patient DOB: August 20, 1946 Practice Name: [facility] Reason for Call: Caller: Willa/Patient; PCP: Tillman Abide I.; CB#: [phone]; Call regarding Sinus Infection Sx; sx started 02/02/12; having alot of nasal mucus; would like to come in for an antibiotic; no fever; coughing up green mucus; Triaged per URI Guideline; See in 24 hr d/t productive cough with colored sputum; appt made for today at 10:15am; will comply Protocol(s) Used: Upper Respiratory Infection (URI) Recommended Outcome per Protocol: See Provider within 24 hours Reason for Outcome: Productive cough with colored sputum (other than clear or white sputum) Care Advice: ~ Use a cool mist humidifier to moisten air. Be sure to clean according to manufacturer's instructions. ~ May inhale steam from hot shower or heated water. Be careful to avoid burns. Increase fluids to 8-12 eight oz (1.6 to 2.4 liters) glasses per day, half of them to be water. Soups, popsicles, fruit juices, non-caffeinated sodas (unless restricting sodium intake), jello, broths, decaf teas, etc. are all okay. Warm fluids can be soothing. ~ ~ Warm fluids may help, or try a mixture of honey and lemon juice in warm tea. Analgesic/Antipyretic Advice - Acetaminophen: Consider acetaminophen as directed on label or by pharmacist/provider for pain or fever PRECAUTIONS: - Use if there is no history of liver disease, alcoholism, or intake of three or more alcohol drinks per day - Only if approved by provider during pregnancy or when breastfeeding - During pregnancy, acetaminophen should not be taken more than 3 consecutive days without telling provider - Do not exceed recommended dose or frequency ~ 02/11/2012 9:40:56AM Page 1 of 1  [email or web address]

## 2012-02-11 NOTE — Progress Notes (Signed)
Subjective:    Patient ID: Kathy Acosta, female    DOB: 01-08-1946, 66 y.o.   MRN: [id number]  HPI Got over last respiratory infection Scalp issue persists---has appt with dermatologist  Has been up in [place] babysitting for a couple of weeks Started with scratchy throat 10 days ago Voice was off Lots of discharge and postnasal drip---esp at night Green nasal discharge Low grade fever in evenings some No sig SOB  Tried saline rinses and claritin--not much help Coricidin for cough is slightly helpful  Current Outpatient Prescriptions on File Prior to Visit  Medication Sig Dispense Refill  . amLODipine (NORVASC) 10 MG tablet Take 1 tablet (10 mg total) by mouth daily.  90 tablet  3  . ascorbic acid (VITAMIN C) 500 MG tablet Take 500 mg by mouth daily.        Marland Kitchen aspirin 81 MG tablet Take 81 mg by mouth daily.        . benazepril (LOTENSIN) 20 MG tablet Take 1 tablet (20 mg total) by mouth daily.  90 tablet  3  . calcium-vitamin D (OSCAL WITH D) 500-200 MG-UNIT per tablet Take 1 tablet by mouth daily.        Marland Kitchen doxazosin (CARDURA) 4 MG tablet Take 1 tablet (4 mg total) by mouth at bedtime.  90 tablet  3  . fish oil-omega-3 fatty acids 1000 MG capsule Take 1 g by mouth daily.        Marland Kitchen glucosamine-chondroitin 500-400 MG tablet Take 2 tablets by mouth daily.        Marland Kitchen losartan-hydrochlorothiazide (HYZAAR) 100-12.5 MG per tablet Take 1 tablet by mouth daily.  90 tablet  3  . metoprolol (TOPROL-XL) 100 MG 24 hr tablet Take 1 tablet (100 mg total) by mouth daily.  90 tablet  3  . Multiple Vitamins-Minerals (ULTRA MEGA PO) Take 2 capsules by mouth daily.        Marland Kitchen triamcinolone (KENALOG) 0.025 % ointment Apply topically 2 (two) times daily as needed.  80 g  1    No Known Allergies  Past Medical History  Diagnosis Date  . Hypertension   . Arthritis   . Hx of colonic polyps   . Impaired fasting glucose     Past Surgical History  Procedure Date  . Cesarean section   . Abdominal hysterectomy    . Cervical discectomy 1992    Family History  Problem Relation Age of Onset  . Hypertension Mother   . Hypertension Father   . Diabetes Father   . Diabetes Brother   . Cancer Paternal Aunt     colon  . Diabetes Brother     History   Social History  . Marital Status: Widowed    Spouse Name: N/A    Number of Children: 2  . Years of Education: N/A   Occupational History  . retired- Geologist, engineering. [employer]    Social History Main Topics  . Smoking status: Never Smoker   . Smokeless tobacco: Never Used  . Alcohol Use: Yes     wine  . Drug Use: Not on file  . Sexually Active: Not on file   Other Topics Concern  . Not on file   Social History Narrative   Walks occasionallyNo living willRequests sons to be health care POAWould like attempts at resuscitation.Not sure about tube feeds   Review of Systems No rash No vomiting or diarrhea Limited appetite     Objective:   Physical Exam  Constitutional:  She appears well-developed and well-nourished. No distress.  HENT:  Mouth/Throat: Oropharynx is clear and moist. No oropharyngeal exudate.       No sinus tenderness TMs normal Moderate nasal congestion and thick opaque mucus  Neck: Normal range of motion. Neck supple.  Pulmonary/Chest: Effort normal and breath sounds normal. No respiratory distress. She has no wheezes. She has no rales.  Lymphadenopathy:    She has no cervical adenopathy.          Assessment & Plan:

## 2012-04-07 ENCOUNTER — Ambulatory Visit (INDEPENDENT_AMBULATORY_CARE_PROVIDER_SITE_OTHER): Payer: Medicare Other | Admitting: Internal Medicine

## 2012-04-07 ENCOUNTER — Encounter: Payer: Self-pay | Admitting: Internal Medicine

## 2012-04-07 VITALS — BP 128/80 | HR 82 | Temp 98.1°F | Ht 62.0 in | Wt 169.0 lb

## 2012-04-07 DIAGNOSIS — L989 Disorder of the skin and subcutaneous tissue, unspecified: Secondary | ICD-10-CM

## 2012-04-07 DIAGNOSIS — I10 Essential (primary) hypertension: Secondary | ICD-10-CM

## 2012-04-07 DIAGNOSIS — M1990 Unspecified osteoarthritis, unspecified site: Secondary | ICD-10-CM

## 2012-04-07 LAB — BASIC METABOLIC PANEL
BUN: 18 mg/dL (ref 6–23)
CO2: 31 mEq/L (ref 19–32)
Chloride: 105 mEq/L (ref 96–112)
Creatinine, Ser: 0.9 mg/dL (ref 0.4–1.2)
Glucose, Bld: 118 mg/dL — ABNORMAL HIGH (ref 70–99)
Potassium: 3.3 mEq/L — ABNORMAL LOW (ref 3.5–5.1)

## 2012-04-07 MED ORDER — BENAZEPRIL HCL 20 MG PO TABS: 20.0000 mg | ORAL_TABLET | Freq: Every day | ORAL | Status: DC

## 2012-04-07 MED ORDER — AMLODIPINE BESYLATE 10 MG PO TABS: 10.0000 mg | ORAL_TABLET | Freq: Every day | ORAL | Status: DC

## 2012-04-07 NOTE — Progress Notes (Signed)
Subjective:    Patient ID: Kathy Acosta, female    DOB: 1946-02-15, 66 y.o.   MRN: [id number]  HPI Doing "great" Still not much exercise---but trying to improve Goes to gym at least twice a week Walks after dinner  Still has intermittent left shoulder and hip pain Relates to arthritis Uses aleve prn with good relief  Now with early bunions Not overly painful  No chest pain No SOB No edema No dizziness or syncope  Current Outpatient Prescriptions on File Prior to Visit  Medication Sig Dispense Refill  . ascorbic acid (VITAMIN C) 500 MG tablet Take 500 mg by mouth daily.        Marland Kitchen aspirin 81 MG tablet Take 81 mg by mouth daily.        . calcium-vitamin D (OSCAL WITH D) 500-200 MG-UNIT per tablet Take 1 tablet by mouth daily.        Marland Kitchen doxazosin (CARDURA) 4 MG tablet Take 1 tablet (4 mg total) by mouth at bedtime.  90 tablet  3  . fish oil-omega-3 fatty acids 1000 MG capsule Take 1 g by mouth daily.        Marland Kitchen glucosamine-chondroitin 500-400 MG tablet Take 2 tablets by mouth daily.        Marland Kitchen losartan-hydrochlorothiazide (HYZAAR) 100-12.5 MG per tablet Take 1 tablet by mouth daily.  90 tablet  3  . metoprolol (TOPROL-XL) 100 MG 24 hr tablet Take 1 tablet (100 mg total) by mouth daily.  90 tablet  3  . Multiple Vitamins-Minerals (ULTRA MEGA PO) Take 2 capsules by mouth daily.        Marland Kitchen triamcinolone (KENALOG) 0.025 % ointment Apply topically 2 (two) times daily as needed.  80 g  1  . DISCONTD: amLODipine (NORVASC) 10 MG tablet Take 1 tablet (10 mg total) by mouth daily.  90 tablet  3  . DISCONTD: benazepril (LOTENSIN) 20 MG tablet Take 1 tablet (20 mg total) by mouth daily.  90 tablet  3    No Known Allergies  Past Medical History  Diagnosis Date  . Hypertension   . Arthritis   . Hx of colonic polyps   . Impaired fasting glucose     Past Surgical History  Procedure Date  . Cesarean section   . Abdominal hysterectomy   . Cervical discectomy 1992    Family History  Problem  Relation Age of Onset  . Hypertension Mother   . Hypertension Father   . Diabetes Father   . Diabetes Brother   . Cancer Paternal Aunt     colon  . Diabetes Brother     History   Social History  . Marital Status: Widowed    Spouse Name: N/A    Number of Children: 2  . Years of Education: N/A   Occupational History  . retired- Geologist, engineering. [employer]    Social History Main Topics  . Smoking status: Never Smoker   . Smokeless tobacco: Never Used  . Alcohol Use: Yes     wine  . Drug Use: Not on file  . Sexually Active: Not on file   Other Topics Concern  . Not on file   Social History Narrative   Walks occasionallyNo living willRequests sons to be health care POAWould like attempts at resuscitation.Not sure about tube feeds   Review of Systems Appetite is fine Weight is down slightly Still has itchy scalp lesion--sees derm next week. Cortisone lotion not much help    Objective:  Physical Exam  Constitutional: She appears well-developed and well-nourished. No distress.  Neck: Normal range of motion. Neck supple. No thyromegaly present.  Cardiovascular: Normal rate, regular rhythm, normal heart sounds and intact distal pulses.  Exam reveals no gallop.   No murmur heard. Pulmonary/Chest: Effort normal and breath sounds normal.  Musculoskeletal: She exhibits no edema and no tenderness.       No crepitus and fair ROM of shoulder Very early bunions without inflammation  Lymphadenopathy:    She has no cervical adenopathy.  Psychiatric: She has a normal mood and affect. Her behavior is normal. Thought content normal.          Assessment & Plan:

## 2012-04-07 NOTE — Assessment & Plan Note (Signed)
BP Readings from Last 3 Encounters:  04/07/12 128/80  02/11/12 126/80  11/11/11 160/100   Good control No changes needed Will recheck potassium

## 2012-04-07 NOTE — Assessment & Plan Note (Signed)
Small hyperpigmented area just at hairline on forehead No flaking Seems less obvious than before  Will await derm eval

## 2012-04-07 NOTE — Assessment & Plan Note (Signed)
Shoulder and hip Does okay with prn aleve

## 2012-04-09 ENCOUNTER — Other Ambulatory Visit: Payer: Self-pay | Admitting: *Deleted

## 2012-04-09 ENCOUNTER — Encounter: Payer: Self-pay | Admitting: *Deleted

## 2012-04-09 MED ORDER — METOPROLOL SUCCINATE ER 100 MG PO TB24: 100.0000 mg | ORAL_TABLET | Freq: Every day | ORAL | Status: DC

## 2012-04-09 MED ORDER — DOXAZOSIN MESYLATE 4 MG PO TABS: 4.0000 mg | ORAL_TABLET | Freq: Every day | ORAL | Status: DC

## 2012-04-09 MED ORDER — AMLODIPINE BESYLATE 10 MG PO TABS: 10.0000 mg | ORAL_TABLET | Freq: Every day | ORAL | Status: DC

## 2012-04-09 MED ORDER — BENAZEPRIL HCL 20 MG PO TABS: 20.0000 mg | ORAL_TABLET | Freq: Every day | ORAL | Status: DC

## 2012-04-09 MED ORDER — LOSARTAN POTASSIUM-HCTZ 100-12.5 MG PO TABS: 1.0000 | ORAL_TABLET | Freq: Every day | ORAL | Status: DC

## 2012-06-29 ENCOUNTER — Other Ambulatory Visit: Payer: Self-pay | Admitting: Internal Medicine

## 2012-06-29 DIAGNOSIS — Z124 Encounter for screening for malignant neoplasm of cervix: Secondary | ICD-10-CM

## 2012-07-28 ENCOUNTER — Ambulatory Visit
Admission: RE | Admit: 2012-07-28 | Discharge: 2012-07-28 | Disposition: A | Discharge status: Home / Self Care | Payer: Medicare Other | Source: Ambulatory Visit | Attending: Internal Medicine | Admitting: Internal Medicine

## 2012-07-28 DIAGNOSIS — Z124 Encounter for screening for malignant neoplasm of cervix: Secondary | ICD-10-CM

## 2012-07-29 ENCOUNTER — Encounter: Payer: Self-pay | Admitting: Internal Medicine

## 2012-07-29 ENCOUNTER — Ambulatory Visit (INDEPENDENT_AMBULATORY_CARE_PROVIDER_SITE_OTHER): Payer: Medicare Other | Admitting: Internal Medicine

## 2012-07-29 VITALS — BP 118/70 | HR 89 | Temp 97.7°F | Wt 169.0 lb

## 2012-07-29 DIAGNOSIS — I10 Essential (primary) hypertension: Secondary | ICD-10-CM

## 2012-07-29 DIAGNOSIS — R42 Dizziness and giddiness: Secondary | ICD-10-CM

## 2012-07-29 DIAGNOSIS — Z23 Encounter for immunization: Secondary | ICD-10-CM

## 2012-07-29 NOTE — Progress Notes (Signed)
Subjective:    Patient ID: Kathy Acosta, female    DOB: 18-Jan-1946, 66 y.o.   MRN: [id number]  HPI For about a month, has noted dizziness Really noticeable when first jumping out of bed Has some sense of headache and pressure around eyeballs  Recurrence this weekend--again after getting out of bed in night Still headache when awoke Gets better as the day goes on Still has nervous feeling  [facility] get "dots" in her eyes and get dizzy feeling Still gardens but hasn't "felt myself"----- worn out too soon, etc SOB easier with exertion No chest pain No palpitations  Current Outpatient Prescriptions on File Prior to Visit  Medication Sig Dispense Refill  . amLODipine (NORVASC) 10 MG tablet Take 1 tablet (10 mg total) by mouth daily.  90 tablet  3  . ascorbic acid (VITAMIN C) 500 MG tablet Take 500 mg by mouth daily.        Marland Kitchen aspirin 81 MG tablet Take 81 mg by mouth daily.        . benazepril (LOTENSIN) 20 MG tablet Take 1 tablet (20 mg total) by mouth daily.  90 tablet  3  . calcium-vitamin D (OSCAL WITH D) 500-200 MG-UNIT per tablet Take 1 tablet by mouth daily.        Marland Kitchen doxazosin (CARDURA) 4 MG tablet Take 1 tablet (4 mg total) by mouth at bedtime.  90 tablet  3  . fish oil-omega-3 fatty acids 1000 MG capsule Take 1 g by mouth daily.        Marland Kitchen glucosamine-chondroitin 500-400 MG tablet Take 2 tablets by mouth daily.        Marland Kitchen losartan-hydrochlorothiazide (HYZAAR) 100-12.5 MG per tablet Take 1 tablet by mouth daily.  90 tablet  3  . metoprolol succinate (TOPROL-XL) 100 MG 24 hr tablet Take 1 tablet (100 mg total) by mouth daily.  90 tablet  3  . Multiple Vitamins-Minerals (ULTRA MEGA PO) Take 2 capsules by mouth daily.        Marland Kitchen triamcinolone (KENALOG) 0.025 % ointment Apply topically 2 (two) times daily as needed.  80 g  1    No Known Allergies  Past Medical History  Diagnosis Date  . Hypertension   . Arthritis   . Hx of colonic polyps   . Impaired  fasting glucose     Past Surgical History  Procedure Date  . Cesarean section   . Abdominal hysterectomy   . Cervical discectomy 1992    Family History  Problem Relation Age of Onset  . Hypertension Mother   . Hypertension Father   . Diabetes Father   . Diabetes Brother   . Cancer Paternal Aunt     colon  . Diabetes Brother     History   Social History  . Marital Status: Widowed    Spouse Name: N/A    Number of Children: 2  . Years of Education: N/A   Occupational History  . retired- Geologist, engineering. [employer]    Social History Main Topics  . Smoking status: Never Smoker   . Smokeless tobacco: Never Used  . Alcohol Use: Yes     wine  . Drug Use: Not on file  . Sexually Active: Not on file   Other Topics Concern  . Not on file   Social History Narrative   Walks occasionallyNo living willRequests sons to be health care POAWould like attempts at resuscitation.Not sure about tube feeds   Review  of Systems Doesn't check BP No true syncope Appetite is okay Generally sleeps okay No depression or sadness No true rotatory vertigo Weight is stable    Objective:   Physical Exam  Constitutional: She appears well-developed and well-nourished. No distress.  Neck: Normal range of motion. Neck supple. No thyromegaly present.  Cardiovascular: Normal rate, regular rhythm, normal heart sounds and intact distal pulses.  Exam reveals no gallop.   No murmur heard. Pulmonary/Chest: Effort normal and breath sounds normal. No respiratory distress. She has no wheezes. She has no rales.  Abdominal: Soft. There is no tenderness.  Musculoskeletal: She exhibits no edema and no tenderness.  Lymphadenopathy:    She has no cervical adenopathy.  Psychiatric: She has a normal mood and affect. Her behavior is normal.          Assessment & Plan:

## 2012-07-29 NOTE — Assessment & Plan Note (Signed)
Sounds mostly orthostatic Not really vestibular Some increased DOE but no clear ischemic symptoms I suspect the doxazosin Will stop that and see her back soon Consider further work up if persists

## 2012-07-29 NOTE — Assessment & Plan Note (Signed)
BP Readings from Last 3 Encounters:  07/29/12 118/70  04/07/12 128/80  02/11/12 126/80   BP generally good Will try off the doxazosin

## 2012-07-29 NOTE — Patient Instructions (Signed)
Please stop the doxazosin 

## 2012-07-29 NOTE — Addendum Note (Signed)
Addended by: Sueanne Margarita on: 07/29/2012 02:34 PM   Modules accepted: Orders

## 2012-08-02 ENCOUNTER — Encounter: Payer: Self-pay | Admitting: *Deleted

## 2012-08-27 ENCOUNTER — Encounter: Payer: Self-pay | Admitting: Internal Medicine

## 2012-08-27 ENCOUNTER — Ambulatory Visit (INDEPENDENT_AMBULATORY_CARE_PROVIDER_SITE_OTHER): Payer: Medicare Other | Admitting: Internal Medicine

## 2012-08-27 VITALS — BP 130/80 | HR 71 | Temp 98.6°F | Wt 167.0 lb

## 2012-08-27 DIAGNOSIS — R42 Dizziness and giddiness: Secondary | ICD-10-CM

## 2012-08-27 DIAGNOSIS — I10 Essential (primary) hypertension: Secondary | ICD-10-CM

## 2012-08-27 DIAGNOSIS — R519 Headache, unspecified: Secondary | ICD-10-CM | POA: Insufficient documentation

## 2012-08-27 DIAGNOSIS — R51 Headache: Secondary | ICD-10-CM

## 2012-08-27 NOTE — Progress Notes (Signed)
Subjective:    Patient ID: Kathy Acosta, female    DOB: Nov 07, 1945, 66 y.o.   MRN: [id number]  HPI The dizziness is gone By 2 weeks after stopping the doxazosin, the heavy feeling was gone  Has had right headache---temporoparietal and behind eye--- over past 3 days No history of migraines Thought this was sinus Tried ibuprofen--did help some Has been intermittent---better with exercising Checked BP when headache was there-- 151/95, 163/95  No chest pain No SOB  Current Outpatient Prescriptions on File Prior to Visit  Medication Sig Dispense Refill  . amLODipine (NORVASC) 10 MG tablet Take 1 tablet (10 mg total) by mouth daily.  90 tablet  3  . ascorbic acid (VITAMIN C) 500 MG tablet Take 500 mg by mouth daily.        Marland Kitchen aspirin 81 MG tablet Take 81 mg by mouth daily.        . benazepril (LOTENSIN) 20 MG tablet Take 1 tablet (20 mg total) by mouth daily.  90 tablet  3  . calcium-vitamin D (OSCAL WITH D) 500-200 MG-UNIT per tablet Take 1 tablet by mouth daily.        . fish oil-omega-3 fatty acids 1000 MG capsule Take 1 g by mouth daily.        Marland Kitchen glucosamine-chondroitin 500-400 MG tablet Take 2 tablets by mouth daily.        Marland Kitchen losartan-hydrochlorothiazide (HYZAAR) 100-12.5 MG per tablet Take 1 tablet by mouth daily.  90 tablet  3  . metoprolol succinate (TOPROL-XL) 100 MG 24 hr tablet Take 1 tablet (100 mg total) by mouth daily.  90 tablet  3  . Multiple Vitamins-Minerals (ULTRA MEGA PO) Take 2 capsules by mouth daily.        Marland Kitchen triamcinolone (KENALOG) 0.025 % ointment Apply topically 2 (two) times daily as needed.  80 g  1    No Known Allergies  Past Medical History  Diagnosis Date  . Hypertension   . Arthritis   . Hx of colonic polyps   . Impaired fasting glucose     Past Surgical History  Procedure Date  . Cesarean section   . Abdominal hysterectomy   . Cervical discectomy 1992    Family History  Problem Relation Age of Onset  . Hypertension Mother   . Hypertension  Father   . Diabetes Father   . Diabetes Brother   . Cancer Paternal Aunt     colon  . Diabetes Brother     History   Social History  . Marital Status: Widowed    Spouse Name: N/A    Number of Children: 2  . Years of Education: N/A   Occupational History  . retired- Geologist, engineering. [employer]    Social History Main Topics  . Smoking status: Never Smoker   . Smokeless tobacco: Never Used  . Alcohol Use: Yes     wine  . Drug Use: Not on file  . Sexually Active: Not on file   Other Topics Concern  . Not on file   Social History Narrative   Walks occasionallyNo living willRequests sons to be health care POAWould like attempts at resuscitation.Not sure about tube feeds   Review of Systems No vision change or amaurosis fugax    Objective:   Physical Exam  Constitutional: She appears well-developed and well-nourished. No distress.  HENT:  Head: Normocephalic.  Mouth/Throat: Oropharynx is clear and moist. No oropharyngeal exudate.       No sinus tenderness Mild  pale congestion in nose  Neck: Normal range of motion. No thyromegaly present.       Some stiffness in back of neck  Cardiovascular: Normal rate, regular rhythm and normal heart sounds.  Exam reveals no gallop.   No murmur heard. Pulmonary/Chest: Effort normal and breath sounds normal. No respiratory distress. She has no wheezes. She has no rales.  Lymphadenopathy:    She has no cervical adenopathy.  Neurological: She is alert. She has normal strength. She displays no tremor. No cranial nerve deficit. She exhibits normal muscle tone. She displays a negative Romberg sign. Coordination and gait normal.  Psychiatric: She has a normal mood and affect. Her behavior is normal. Thought content normal.          Assessment & Plan:

## 2012-08-27 NOTE — Assessment & Plan Note (Signed)
Gone now off the doxazosin

## 2012-08-27 NOTE — Assessment & Plan Note (Signed)
New this week Could be pressure but likely tension from neck Okay to continue ibuprofen Try heat Reassured --not worrisome

## 2012-08-27 NOTE — Assessment & Plan Note (Signed)
BP Readings from Last 3 Encounters:  08/27/12 130/80  07/29/12 118/70  04/07/12 128/80   Good control off the doxazosin

## 2012-09-21 ENCOUNTER — Other Ambulatory Visit: Payer: Self-pay

## 2012-09-21 NOTE — Telephone Encounter (Signed)
Pt checking on status of refill for Losartan HCTZ; sent to [facility] on 04/09/12. Pt will ck with pharmacy.

## 2012-11-10 ENCOUNTER — Encounter: Payer: Self-pay | Admitting: Internal Medicine

## 2012-11-10 ENCOUNTER — Ambulatory Visit (INDEPENDENT_AMBULATORY_CARE_PROVIDER_SITE_OTHER): Payer: Medicare Other | Admitting: Internal Medicine

## 2012-11-10 VITALS — BP 130/80 | HR 84 | Temp 98.4°F | Ht 62.0 in | Wt 167.0 lb

## 2012-11-10 DIAGNOSIS — R7301 Impaired fasting glucose: Secondary | ICD-10-CM

## 2012-11-10 DIAGNOSIS — Z0000 Encounter for general adult medical examination without abnormal findings: Secondary | ICD-10-CM

## 2012-11-10 DIAGNOSIS — I10 Essential (primary) hypertension: Secondary | ICD-10-CM

## 2012-11-10 LAB — HEPATIC FUNCTION PANEL
Albumin: 3.7 g/dL (ref 3.5–5.2)
Total Protein: 7.2 g/dL (ref 6.0–8.3)

## 2012-11-10 LAB — CBC WITH DIFFERENTIAL/PLATELET
Basophils Absolute: 0 10*3/uL (ref 0.0–0.1)
Eosinophils Absolute: 0.1 10*3/uL (ref 0.0–0.7)
HCT: 38.1 % (ref 36.0–46.0)
Hemoglobin: 12.5 g/dL (ref 12.0–15.0)
Lymphs Abs: 1.9 10*3/uL (ref 0.7–4.0)
MCHC: 32.9 g/dL (ref 30.0–36.0)
MCV: 85.7 fl (ref 78.0–100.0)
Neutro Abs: 4.8 10*3/uL (ref 1.4–7.7)
RDW: 14.2 % (ref 11.5–14.6)

## 2012-11-10 LAB — BASIC METABOLIC PANEL
CO2: 33 mEq/L — ABNORMAL HIGH (ref 19–32)
Glucose, Bld: 118 mg/dL — ABNORMAL HIGH (ref 70–99)
Potassium: 3.5 mEq/L (ref 3.5–5.1)
Sodium: 139 mEq/L (ref 135–145)

## 2012-11-10 LAB — LIPID PANEL: HDL: 42.5 mg/dL (ref >39.00)

## 2012-11-10 LAB — HEMOGLOBIN A1C: Hgb A1c MFr Bld: 6.1 % (ref 4.6–6.5)

## 2012-11-10 NOTE — Assessment & Plan Note (Signed)
Generally doing well Feels good Regular exercise UTD with imms and cancer screening

## 2012-11-10 NOTE — Progress Notes (Signed)
Subjective:    Patient ID: Kathy Acosta, female    DOB: 1945/12/21, 67 y.o.   MRN: [id number]  HPI Here for physical Feeling really well Exercises regularly-- Silver Sneakers  No new concerns Has occasional mild tremor--doesn't seem to have progressed in the past couple of years  Current Outpatient Prescriptions on File Prior to Visit  Medication Sig Dispense Refill  . amLODipine (NORVASC) 10 MG tablet Take 1 tablet (10 mg total) by mouth daily.  90 tablet  3  . ascorbic acid (VITAMIN C) 500 MG tablet Take 500 mg by mouth daily.        Marland Kitchen aspirin 81 MG tablet Take 81 mg by mouth daily.        . benazepril (LOTENSIN) 20 MG tablet Take 1 tablet (20 mg total) by mouth daily.  90 tablet  3  . calcium-vitamin D (OSCAL WITH D) 500-200 MG-UNIT per tablet Take 1 tablet by mouth daily.        . fish oil-omega-3 fatty acids 1000 MG capsule Take 1 g by mouth daily.        Marland Kitchen glucosamine-chondroitin 500-400 MG tablet Take 2 tablets by mouth daily.        Marland Kitchen losartan-hydrochlorothiazide (HYZAAR) 100-12.5 MG per tablet Take 1 tablet by mouth daily.  90 tablet  3  . metoprolol succinate (TOPROL-XL) 100 MG 24 hr tablet Take 1 tablet (100 mg total) by mouth daily.  90 tablet  3  . Multiple Vitamins-Minerals (ULTRA MEGA PO) Take 2 capsules by mouth daily.        Marland Kitchen triamcinolone (KENALOG) 0.025 % ointment Apply topically 2 (two) times daily as needed.  80 g  1    Allergies  Allergen Reactions  . Doxazosin     dizziness    Past Medical History  Diagnosis Date  . Hypertension   . Arthritis   . Hx of colonic polyps   . Impaired fasting glucose     Past Surgical History  Procedure Date  . Cesarean section   . Abdominal hysterectomy   . Cervical discectomy 1992    Family History  Problem Relation Age of Onset  . Hypertension Mother   . Hypertension Father   . Diabetes Father   . Diabetes Brother   . Cancer Paternal Aunt     colon  . Diabetes Brother     History   Social History  .  Marital Status: Widowed    Spouse Name: N/A    Number of Children: 2  . Years of Education: N/A   Occupational History  . retired- Geologist, engineering. [employer]    Social History Main Topics  . Smoking status: Never Smoker   . Smokeless tobacco: Never Used  . Alcohol Use: Yes     Comment: wine  . Drug Use: Not on file  . Sexually Active: Not on file   Other Topics Concern  . Not on file   Social History Narrative   Walks occasionallyNo living willRequests sons to be health care POAWould like attempts at resuscitation.Not sure about tube feeds   Review of Systems  Constitutional: Negative for fatigue and unexpected weight change.       Wears seat belt  HENT: Negative for hearing loss, congestion, rhinorrhea, dental problem and tinnitus.        Regular with dentist  Eyes: Negative for visual disturbance.       Now needs glasses No diplopia or unilateral vision loss  Respiratory: Negative for cough, chest  tightness and shortness of breath.   Cardiovascular: Negative for chest pain, palpitations and leg swelling.  Gastrointestinal: Negative for nausea, vomiting, abdominal pain, constipation and blood in stool.       No heartburn  Genitourinary: Negative for dysuria, urgency and difficulty urinating.       No sexual problems  Musculoskeletal: Positive for arthralgias. Negative for back pain and joint swelling.       Mild right elbow pain--?tennis elbow  Skin: Negative for rash.       Got biopsy of scalp lesion by derm---nothing worrisome Still has dry, itchy scalp  Neurological: Positive for tremors. Negative for dizziness, syncope, weakness, numbness and headaches.  Hematological: Negative for adenopathy. Bruises/bleeds easily.  Psychiatric/Behavioral: Negative for sleep disturbance and dysphoric mood. The patient is not nervous/anxious.        Objective:   Physical Exam  Constitutional: She is oriented to person, place, and time. She appears well-developed and  well-nourished. No distress.  HENT:  Head: Normocephalic and atraumatic.  Right Ear: External ear normal.  Left Ear: External ear normal.  Mouth/Throat: Oropharynx is clear and moist. No oropharyngeal exudate.  Eyes: Conjunctivae normal and EOM are normal. Pupils are equal, round, and reactive to light.  Neck: Normal range of motion. Neck supple. No thyromegaly present.  Cardiovascular: Normal rate, regular rhythm, normal heart sounds and intact distal pulses.  Exam reveals no gallop.   No murmur heard. Pulmonary/Chest: Effort normal and breath sounds normal. No respiratory distress. She has no wheezes. She has no rales.  Abdominal: Soft. There is no tenderness.  Musculoskeletal: She exhibits no edema and no tenderness.       Increased soft tissue in right forearm--?lipoma (but very diffuse). No tenderness  Lymphadenopathy:    She has no cervical adenopathy.    She has no axillary adenopathy.  Neurological: She is alert and oriented to person, place, and time.  Skin: No rash noted. No erythema.  Psychiatric: She has a normal mood and affect. Her behavior is normal.          Assessment & Plan:

## 2012-11-10 NOTE — Patient Instructions (Addendum)
Call if your right arm swelling worsens. We will start by checking a plain x-ray.  DASH Diet The DASH diet stands for "Dietary Approaches to Stop Hypertension." It is a healthy eating plan that has been shown to reduce high blood pressure (hypertension) in as little as 14 days, while also possibly providing other significant health benefits. These other health benefits include reducing the risk of breast cancer after menopause and reducing the risk of type 2 diabetes, heart disease, colon cancer, and stroke. Health benefits also include weight loss and slowing kidney failure in patients with chronic kidney disease.  DIET GUIDELINES  Limit salt (sodium). Your diet should contain less than 1500 mg of sodium daily.  Limit refined or processed carbohydrates. Your diet should include mostly whole grains. Desserts and added sugars should be used sparingly.  Include small amounts of heart-healthy fats. These types of fats include nuts, oils, and tub margarine. Limit saturated and trans fats. These fats have been shown to be harmful in the body. CHOOSING FOODS  The following food groups are based on a 2000 calorie diet. See your Registered Dietitian for individual calorie needs. Grains and Grain Products (6 to 8 servings daily)  Eat More Often: Whole-wheat bread, brown rice, whole-grain or wheat pasta, quinoa, popcorn without added fat or salt (air popped).  Eat Less Often: White bread, white pasta, white rice, cornbread. Vegetables (4 to 5 servings daily)  Eat More Often: Fresh, frozen, and canned vegetables. Vegetables may be raw, steamed, roasted, or grilled with a minimal amount of fat.  Eat Less Often/Avoid: Creamed or fried vegetables. Vegetables in a cheese sauce. Fruit (4 to 5 servings daily)  Eat More Often: All fresh, canned (in natural juice), or frozen fruits. Dried fruits without added sugar. One hundred percent fruit juice ( cup [237 mL] daily).  Eat Less Often: Dried fruits with  added sugar. Canned fruit in light or heavy syrup. [employer], Fish, and Poultry (2 servings or less daily. One serving is 3 to 4 oz [85-114 g]).  Eat More Often: Ninety percent or leaner ground beef, tenderloin, sirloin. Round cuts of beef, chicken breast, [place] breast. All fish. Grill, bake, or broil your meat. Nothing should be fried.  Eat Less Often/Avoid: Fatty cuts of meat, [place], or chicken leg, thigh, or wing. Fried cuts of meat or fish. Dairy (2 to 3 servings)  Eat More Often: Low-fat or fat-free milk, low-fat plain or light yogurt, reduced-fat or part-skim cheese.  Eat Less Often/Avoid: Milk (whole, 2%).Whole milk yogurt. Full-fat cheeses. Nuts, Seeds, and Legumes (4 to 5 servings per week)  Eat More Often: All without added salt.  Eat Less Often/Avoid: Salted nuts and seeds, canned beans with added salt. Fats and Sweets (limited)  Eat More Often: Vegetable oils, tub margarines without trans fats, sugar-free gelatin. Mayonnaise and salad dressings.  Eat Less Often/Avoid: Coconut oils, palm oils, butter, stick margarine, cream, half and half, cookies, candy, pie. FOR MORE INFORMATION The Dash Diet Eating Plan: www.dashdiet.org Document Released: 10/09/2011 Document Revised: 01/12/2012 Document Reviewed: 10/09/2011 [facility] Patient Information 2013 [place], [place].

## 2012-11-10 NOTE — Assessment & Plan Note (Signed)
BP Readings from Last 3 Encounters:  11/10/12 130/80  08/27/12 130/80  07/29/12 118/70   Still well controlled No changes needed

## 2012-11-10 NOTE — Assessment & Plan Note (Signed)
Discussed proper eating and weight management

## 2012-11-15 ENCOUNTER — Ambulatory Visit (INDEPENDENT_AMBULATORY_CARE_PROVIDER_SITE_OTHER): Payer: Medicare Other | Admitting: Internal Medicine

## 2012-11-15 ENCOUNTER — Encounter: Payer: Self-pay | Admitting: Internal Medicine

## 2012-11-15 VITALS — BP 160/90 | HR 85 | Temp 97.7°F | Resp 12 | Wt 170.0 lb

## 2012-11-15 DIAGNOSIS — J019 Acute sinusitis, unspecified: Secondary | ICD-10-CM

## 2012-11-15 MED ORDER — HYDROCODONE-HOMATROPINE 5-1.5 MG/5ML PO SYRP: 5.0000 mL | ORAL_SOLUTION | Freq: Every evening | ORAL | Status: DC | PRN

## 2012-11-15 MED ORDER — AMOXICILLIN 500 MG PO TABS: 1000.0000 mg | ORAL_TABLET | Freq: Two times a day (BID) | ORAL | Status: DC

## 2012-11-15 NOTE — Progress Notes (Signed)
Subjective:    Patient ID: Kathy Acosta, female    DOB: [birth year], 67 y.o.   MRN: [id number]  HPI Started with cough and thick mucus Bad purulent sputum Had started with early symptoms at her physical 5 days ago--goes back a week or so  No fever Head is very congested Mostly PND but some nasal discharge--green from both sites No SOB No ear pain Some sore throat  Has treid Alka seltzer day and night--not much help Also hot tea Has been up some nights due to cough  Current Outpatient Prescriptions on File Prior to Visit  Medication Sig Dispense Refill  . amLODipine (NORVASC) 10 MG tablet Take 1 tablet (10 mg total) by mouth daily.  90 tablet  3  . ascorbic acid (VITAMIN C) 500 MG tablet Take 500 mg by mouth daily.        Marland Kitchen aspirin 81 MG tablet Take 81 mg by mouth daily.        . benazepril (LOTENSIN) 20 MG tablet Take 1 tablet (20 mg total) by mouth daily.  90 tablet  3  . calcium-vitamin D (OSCAL WITH D) 500-200 MG-UNIT per tablet Take 1 tablet by mouth daily.        . fish oil-omega-3 fatty acids 1000 MG capsule Take 1 g by mouth daily.        Marland Kitchen glucosamine-chondroitin 500-400 MG tablet Take 2 tablets by mouth daily.        Marland Kitchen losartan-hydrochlorothiazide (HYZAAR) 100-12.5 MG per tablet Take 1 tablet by mouth daily.  90 tablet  3  . metoprolol succinate (TOPROL-XL) 100 MG 24 hr tablet Take 1 tablet (100 mg total) by mouth daily.  90 tablet  3  . Multiple Vitamins-Minerals (ULTRA MEGA PO) Take 2 capsules by mouth daily.          Allergies  Allergen Reactions  . Doxazosin     dizziness    Past Medical History  Diagnosis Date  . Hypertension   . Arthritis   . Hx of colonic polyps   . Impaired fasting glucose     Past Surgical History  Procedure Date  . Cesarean section   . Abdominal hysterectomy   . Cervical discectomy 1992    Family History  Problem Relation Age of Onset  . Hypertension Mother   . Hypertension Father   . Diabetes Father   . Diabetes Brother    . Cancer Paternal Aunt     colon  . Diabetes Brother     History   Social History  . Marital Status: Widowed    Spouse Name: N/A    Number of Children: 2  . Years of Education: N/A   Occupational History  . retired- Geologist, engineering. [employer]    Social History Main Topics  . Smoking status: Never Smoker   . Smokeless tobacco: Never Used  . Alcohol Use: Yes     Comment: wine  . Drug Use: Not on file  . Sexually Active: Not on file   Other Topics Concern  . Not on file   Social History Narrative   Walks occasionallyNo living willRequests sons to be health care POAWould like attempts at resuscitation.Not sure about tube feeds   Review of Systems No rash No vomiting or diarrhea    Objective:   Physical Exam  Constitutional: She appears well-developed and well-nourished. No distress.  HENT:  Mouth/Throat: Oropharynx is clear and moist. No oropharyngeal exudate.       No sinus tenderness Moderate  nasal inflammation TMs normal  Neck: Normal range of motion. Neck supple. No thyromegaly present.  Pulmonary/Chest: Effort normal and breath sounds normal. No respiratory distress. She has no wheezes. She has no rales.  Lymphadenopathy:    She has no cervical adenopathy.          Assessment & Plan:

## 2012-11-15 NOTE — Assessment & Plan Note (Signed)
Still may be just viral despite the heavy purulent mucus Discussed supportive care with analgesics Hycodan Amoxil if worsens

## 2012-11-15 NOTE — Patient Instructions (Signed)
Start the amoxicillin antibiotic if you get worse or are not feeling better by the end of the week

## 2013-05-10 ENCOUNTER — Ambulatory Visit (INDEPENDENT_AMBULATORY_CARE_PROVIDER_SITE_OTHER): Payer: Medicare Other | Admitting: Internal Medicine

## 2013-05-10 ENCOUNTER — Encounter: Payer: Self-pay | Admitting: Internal Medicine

## 2013-05-10 VITALS — BP 140/80 | HR 80 | Temp 98.0°F | Wt 167.0 lb

## 2013-05-10 DIAGNOSIS — I10 Essential (primary) hypertension: Secondary | ICD-10-CM

## 2013-05-10 DIAGNOSIS — R259 Unspecified abnormal involuntary movements: Secondary | ICD-10-CM

## 2013-05-10 DIAGNOSIS — R251 Tremor, unspecified: Secondary | ICD-10-CM

## 2013-05-10 DIAGNOSIS — M1990 Unspecified osteoarthritis, unspecified site: Secondary | ICD-10-CM

## 2013-05-10 MED ORDER — AMLODIPINE BESYLATE 10 MG PO TABS: 10.0000 mg | ORAL_TABLET | Freq: Every day | ORAL | Status: DC

## 2013-05-10 MED ORDER — BENAZEPRIL HCL 20 MG PO TABS: 20.0000 mg | ORAL_TABLET | Freq: Every day | ORAL | Status: DC

## 2013-05-10 MED ORDER — LOSARTAN POTASSIUM-HCTZ 100-12.5 MG PO TABS: 1.0000 | ORAL_TABLET | Freq: Every day | ORAL | Status: DC

## 2013-05-10 NOTE — Assessment & Plan Note (Signed)
Very mild resting tremor in hands Not enough to consider meds

## 2013-05-10 NOTE — Assessment & Plan Note (Signed)
BP Readings from Last 3 Encounters:  05/10/13 140/80  11/15/12 160/90  11/10/12 130/80   Good control No changes

## 2013-05-10 NOTE — Progress Notes (Signed)
Subjective:    Patient ID: Kathy Acosta, female    DOB: June 03, 1946, 67 y.o.   MRN: [id number]  HPI Doing well  Had 2 months of left hip pain Very bad but didn't want to come to doctor Couldn't even walk Now feels better Has gotten back to exercise  Doesn't check BP Careful with salt No headache No chest pain, SOB, dizziness, syncope  Still has mild tremor Not bad Mostly in hands--not worse with intention  Current Outpatient Prescriptions on File Prior to Visit  Medication Sig Dispense Refill  . ascorbic acid (VITAMIN C) 500 MG tablet Take 500 mg by mouth daily.        Marland Kitchen aspirin 81 MG tablet Take 81 mg by mouth daily.        . calcium-vitamin D (OSCAL WITH D) 500-200 MG-UNIT per tablet Take 1 tablet by mouth daily.        . fish oil-omega-3 fatty acids 1000 MG capsule Take 1 g by mouth daily.        Marland Kitchen glucosamine-chondroitin 500-400 MG tablet Take 2 tablets by mouth daily.        . Multiple Vitamins-Minerals (ULTRA MEGA PO) Take 2 capsules by mouth daily.         No current facility-administered medications on file prior to visit.    Allergies  Allergen Reactions  . Doxazosin     dizziness    Past Medical History  Diagnosis Date  . Hypertension   . Arthritis   . Hx of colonic polyps   . Impaired fasting glucose     Past Surgical History  Procedure Laterality Date  . Cesarean section    . Abdominal hysterectomy    . Cervical discectomy  1992    Family History  Problem Relation Age of Onset  . Hypertension Mother   . Hypertension Father   . Diabetes Father   . Diabetes Brother   . Cancer Paternal Aunt     colon  . Diabetes Brother     History   Social History  . Marital Status: Widowed    Spouse Name: N/A    Number of Children: 2  . Years of Education: N/A   Occupational History  . retired- Geologist, engineering. [employer]    Social History Main Topics  . Smoking status: Never Smoker   . Smokeless tobacco: Never Used  . Alcohol Use: Yes   Comment: wine  . Drug Use: Not on file  . Sexually Active: Not on file   Other Topics Concern  . Not on file   Social History Narrative   Walks occasionally   No living will   Requests sons to be health care POA   Would like attempts at resuscitation.   Not sure about tube feeds   Review of Systems Careful with diet Weight down 3# Sleeps well    Objective:   Physical Exam  Constitutional: She appears well-developed and well-nourished. No distress.  Neck: Normal range of motion. Neck supple. No thyromegaly present.  Cardiovascular: Normal rate, regular rhythm and normal heart sounds.  Exam reveals no gallop.   No murmur heard. Pulmonary/Chest: Effort normal and breath sounds normal. No respiratory distress. She has no wheezes. She has no rales.  Abdominal: Soft. There is no tenderness.  Musculoskeletal: She exhibits no edema and no tenderness.  Points to left trochanteric bursa as the worst pain point. Normal ROM of hips though  Lymphadenopathy:    She has no cervical adenopathy.  Psychiatric:  She has a normal mood and affect. Her behavior is normal.          Assessment & Plan:

## 2013-05-10 NOTE — Assessment & Plan Note (Signed)
May have had trochanteric bursitis Discussed trying ice if recurs

## 2013-06-23 ENCOUNTER — Other Ambulatory Visit: Payer: Self-pay

## 2013-06-23 DIAGNOSIS — Z1231 Encounter for screening mammogram for malignant neoplasm of breast: Secondary | ICD-10-CM

## 2013-08-02 ENCOUNTER — Ambulatory Visit
Admission: RE | Admit: 2013-08-02 | Discharge: 2013-08-02 | Disposition: A | Discharge status: Home / Self Care | Payer: Medicare Other | Source: Ambulatory Visit

## 2013-08-02 DIAGNOSIS — Z1231 Encounter for screening mammogram for malignant neoplasm of breast: Secondary | ICD-10-CM

## 2013-08-04 ENCOUNTER — Ambulatory Visit (INDEPENDENT_AMBULATORY_CARE_PROVIDER_SITE_OTHER): Payer: Medicare Other

## 2013-08-04 DIAGNOSIS — Z23 Encounter for immunization: Secondary | ICD-10-CM

## 2013-11-16 ENCOUNTER — Ambulatory Visit (INDEPENDENT_AMBULATORY_CARE_PROVIDER_SITE_OTHER): Payer: Medicare Other | Admitting: Internal Medicine

## 2013-11-16 ENCOUNTER — Encounter: Payer: Self-pay | Admitting: Internal Medicine

## 2013-11-16 VITALS — BP 128/70 | HR 74 | Temp 98.6°F | Ht 62.0 in | Wt 163.0 lb

## 2013-11-16 DIAGNOSIS — R7301 Impaired fasting glucose: Secondary | ICD-10-CM

## 2013-11-16 DIAGNOSIS — G250 Essential tremor: Secondary | ICD-10-CM | POA: Insufficient documentation

## 2013-11-16 DIAGNOSIS — I10 Essential (primary) hypertension: Secondary | ICD-10-CM

## 2013-11-16 DIAGNOSIS — Z0000 Encounter for general adult medical examination without abnormal findings: Secondary | ICD-10-CM

## 2013-11-16 LAB — BASIC METABOLIC PANEL
BUN: 13 mg/dL (ref 6–23)
CHLORIDE: 101 meq/L (ref 96–112)
CO2: 31 mEq/L (ref 19–32)
Calcium: 9.4 mg/dL (ref 8.4–10.5)
Creatinine, Ser: 0.8 mg/dL (ref 0.4–1.2)
GFR: 96.92 mL/min (ref >60.00)
GLUCOSE: 102 mg/dL — AB (ref 70–99)
POTASSIUM: 3 meq/L — AB (ref 3.5–5.1)
Sodium: 139 mEq/L (ref 135–145)

## 2013-11-16 LAB — CBC WITH DIFFERENTIAL/PLATELET
Basophils Absolute: 0 10*3/uL (ref 0.0–0.1)
Basophils Relative: 0.6 % (ref 0.0–3.0)
EOS PCT: 2.7 % (ref 0.0–5.0)
Eosinophils Absolute: 0.1 10*3/uL (ref 0.0–0.7)
HEMATOCRIT: 38.9 % (ref 36.0–46.0)
Hemoglobin: 12.9 g/dL (ref 12.0–15.0)
LYMPHS ABS: 2 10*3/uL (ref 0.7–4.0)
Lymphocytes Relative: 39.2 % (ref 12.0–46.0)
MCHC: 33.3 g/dL (ref 30.0–36.0)
MCV: 84.5 fl (ref 78.0–100.0)
MONO ABS: 0.5 10*3/uL (ref 0.1–1.0)
Monocytes Relative: 9.3 % (ref 3.0–12.0)
Neutro Abs: 2.4 10*3/uL (ref 1.4–7.7)
Neutrophils Relative %: 48.2 % (ref 43.0–77.0)
PLATELETS: 253 10*3/uL (ref 150.0–400.0)
RBC: 4.6 Mil/uL (ref 3.87–5.11)
RDW: 13.8 % (ref 11.5–14.6)
WBC: 5 10*3/uL (ref 4.5–10.5)

## 2013-11-16 LAB — LIPID PANEL
CHOLESTEROL: 170 mg/dL (ref 0–200)
HDL: 47.6 mg/dL (ref >39.00)
LDL Cholesterol: 95 mg/dL (ref 0–99)
TRIGLYCERIDES: 138 mg/dL (ref 0.0–149.0)
Total CHOL/HDL Ratio: 4
VLDL: 27.6 mg/dL (ref 0.0–40.0)

## 2013-11-16 LAB — HEPATIC FUNCTION PANEL
ALBUMIN: 3.9 g/dL (ref 3.5–5.2)
ALT: 37 U/L — ABNORMAL HIGH (ref 0–35)
AST: 35 U/L (ref 0–37)
Alkaline Phosphatase: 70 U/L (ref 39–117)
Bilirubin, Direct: 0.1 mg/dL (ref 0.0–0.3)
TOTAL PROTEIN: 7.3 g/dL (ref 6.0–8.3)
Total Bilirubin: 0.4 mg/dL (ref 0.3–1.2)

## 2013-11-16 LAB — HEMOGLOBIN A1C: HEMOGLOBIN A1C: 5.9 % (ref 4.6–6.5)

## 2013-11-16 LAB — TSH: TSH: 1.86 u[IU]/mL (ref 0.35–5.50)

## 2013-11-16 LAB — T4, FREE: Free T4: 0.78 ng/dL (ref 0.60–1.60)

## 2013-11-16 MED ORDER — BENAZEPRIL HCL 20 MG PO TABS: 20.0000 mg | ORAL_TABLET | Freq: Every day | ORAL | Status: DC

## 2013-11-16 MED ORDER — AMLODIPINE BESYLATE 10 MG PO TABS: 10.0000 mg | ORAL_TABLET | Freq: Every day | ORAL | Status: DC

## 2013-11-16 MED ORDER — LOSARTAN POTASSIUM-HCTZ 100-12.5 MG PO TABS: 1.0000 | ORAL_TABLET | Freq: Every day | ORAL | Status: DC

## 2013-11-16 NOTE — Addendum Note (Signed)
Addended by: Despina Hidden on: 11/16/2013 10:30 AM   Modules accepted: Orders

## 2013-11-16 NOTE — Progress Notes (Signed)
Subjective:    Patient ID: Kathy Acosta, female    DOB: May 23, 1946, 68 y.o.   MRN: [id number]  HPI Here for physical Feels "exhaused"-- not sick but no energy Still some right arm problems, but hip is better No falls No depression or anhedonia Only walking 2 days per week---had been so busy over holidays  Continues on same meds Doesn't check BP  Current Outpatient Prescriptions on File Prior to Visit  Medication Sig Dispense Refill  . amLODipine (NORVASC) 10 MG tablet Take 1 tablet (10 mg total) by mouth daily.  90 tablet  3  . ascorbic acid (VITAMIN C) 500 MG tablet Take 500 mg by mouth daily.        Marland Kitchen aspirin 81 MG tablet Take 81 mg by mouth daily.        . benazepril (LOTENSIN) 20 MG tablet Take 1 tablet (20 mg total) by mouth daily.  90 tablet  3  . calcium-vitamin D (OSCAL WITH D) 500-200 MG-UNIT per tablet Take 1 tablet by mouth daily.        . fish oil-omega-3 fatty acids 1000 MG capsule Take 1 g by mouth daily.        Marland Kitchen glucosamine-chondroitin 500-400 MG tablet Take 2 tablets by mouth daily.        Marland Kitchen losartan-hydrochlorothiazide (HYZAAR) 100-12.5 MG per tablet Take 1 tablet by mouth daily.  90 tablet  3  . Multiple Vitamins-Minerals (ULTRA MEGA PO) Take 2 capsules by mouth daily.         No current facility-administered medications on file prior to visit.    Allergies  Allergen Reactions  . Doxazosin     dizziness    Past Medical History  Diagnosis Date  . Hypertension   . Arthritis   . Hx of colonic polyps   . Impaired fasting glucose   . Essential tremor     Past Surgical History  Procedure Laterality Date  . Cesarean section    . Abdominal hysterectomy    . Cervical discectomy  1992    Family History  Problem Relation Age of Onset  . Hypertension Mother   . Hypertension Father   . Diabetes Father   . Diabetes Brother   . Cancer Paternal Aunt     colon  . Diabetes Brother     History   Social History  . Marital Status: Widowed    Spouse  Name: N/A    Number of Children: 2  . Years of Education: N/A   Occupational History  . retired- Games developer. [employer]    Social History Main Topics  . Smoking status: Never Smoker   . Smokeless tobacco: Never Used  . Alcohol Use: Yes     Comment: wine  . Drug Use: Not on file  . Sexual Activity: Not on file   Other Topics Concern  . Not on file   Social History Narrative   Walks occasionally   No living will   Requests sons to be health care POA   Would like attempts at resuscitation.   Probably wouldn't want tube feeds   Review of Systems  Constitutional: Positive for fatigue. Negative for unexpected weight change.       Wears seat belt  HENT: Positive for rhinorrhea. Negative for congestion, dental problem, hearing loss and tinnitus.        Gustatory rhinorrhea at times Regular with dentist  Eyes: Negative for visual disturbance.       No diplopia or  unilateral vision loss  Respiratory: Negative for cough, chest tightness and shortness of breath.   Cardiovascular: Positive for palpitations. Negative for chest pain and leg swelling.       Rare heart flutter  Gastrointestinal: Negative for nausea, vomiting, abdominal pain, constipation and blood in stool.       Heartburn if she eats tomato--tums help  Endocrine: Negative for cold intolerance and heat intolerance.  Genitourinary: Negative for dysuria, hematuria and difficulty urinating.  Musculoskeletal: Positive for arthralgias. Negative for back pain and joint swelling.       Left 3rd finger with pain  Skin: Negative for rash.       No suspicious lesions  Allergic/Immunologic: Negative for environmental allergies and immunocompromised state.  Neurological: Positive for weakness. Negative for dizziness, syncope, light-headedness, numbness and headaches.       Slight left leg weakness before Christmas--better now  Hematological: Negative for adenopathy. Bruises/bleeds easily.  Psychiatric/Behavioral: Positive  for sleep disturbance. Negative for dysphoric mood. The patient is not nervous/anxious.        Occasional sleepless nights       Objective:   Physical Exam  Constitutional: She is oriented to person, place, and time. She appears well-developed and well-nourished. No distress.  HENT:  Head: Normocephalic and atraumatic.  Right Ear: External ear normal.  Left Ear: External ear normal.  Mouth/Throat: Oropharynx is clear and moist. No oropharyngeal exudate.  Eyes: Conjunctivae and EOM are normal.  Neck: Normal range of motion. Neck supple. No thyromegaly present.  Cardiovascular: Normal rate, regular rhythm, normal heart sounds and intact distal pulses.  Exam reveals no gallop.   No murmur heard. Pulmonary/Chest: Effort normal and breath sounds normal. No respiratory distress. She has no wheezes. She has no rales.  Abdominal: Soft. There is no tenderness.  Genitourinary:  Mild cystic changes in both breasts No worrisome lesions  Musculoskeletal: She exhibits no edema and no tenderness.  Lymphadenopathy:    She has no cervical adenopathy.    She has no axillary adenopathy.  Neurological: She is alert and oriented to person, place, and time.  Skin: No rash noted. No erythema.  Psychiatric: She has a normal mood and affect. Her behavior is normal.          Assessment & Plan:

## 2013-11-16 NOTE — Patient Instructions (Addendum)
You can try ranitidine 150mg  or famotidine 20mg  (over the counter) a few hours before you eat tomato foods.  Exercise to Lose Weight Exercise and a healthy diet may help you lose weight. Your doctor may suggest specific exercises. EXERCISE IDEAS AND TIPS  Choose low-cost things you enjoy doing, such as walking, bicycling, or exercising to workout videos.  Take stairs instead of the elevator.  Walk during your lunch break.  Park your car further away from work or school.  Go to a gym or an exercise class.  Start with 5 to 10 minutes of exercise each day. Build up to 30 minutes of exercise 4 to 6 days a week.  Wear shoes with good support and comfortable clothes.  Stretch before and after working out.  Work out until you breathe harder and your heart beats faster.  Drink extra water when you exercise.  Do not do so much that you hurt yourself, feel dizzy, or get very short of breath. Exercises that burn about 150 calories:  Running 1  miles in 15 minutes.  Playing volleyball for 45 to 60 minutes.  Washing and waxing a car for 45 to 60 minutes.  Playing touch football for 45 minutes.  Walking 1  miles in 35 minutes.  Pushing a stroller 1  miles in 30 minutes.  Playing basketball for 30 minutes.  Raking leaves for 30 minutes.  Bicycling 5 miles in 30 minutes.  Walking 2 miles in 30 minutes.  Dancing for 30 minutes.  Shoveling snow for 15 minutes.  Swimming laps for 20 minutes.  Walking up stairs for 15 minutes.  Bicycling 4 miles in 15 minutes.  Gardening for 30 to 45 minutes.  Jumping rope for 15 minutes.  Washing windows or floors for 45 to 60 minutes. Document Released: 11/22/2010 Document Revised: 01/12/2012 Document Reviewed: 11/22/2010 [facility] Patient Information 2014 [place], [place].

## 2013-11-16 NOTE — Assessment & Plan Note (Signed)
BP Readings from Last 3 Encounters:  11/16/13 128/70  05/10/13 140/80  11/15/12 160/90   Good control Will continue ACEI and ARB since she has done well on it

## 2013-11-16 NOTE — Assessment & Plan Note (Signed)
Will recheck labs 

## 2013-11-16 NOTE — Assessment & Plan Note (Signed)
Fairly healthy UTD on preventative care Discussed increasing exercise

## 2013-11-16 NOTE — Progress Notes (Signed)
Pre-visit discussion using our clinic review tool. No additional management support is needed unless otherwise documented below in the visit note.  

## 2013-12-07 ENCOUNTER — Telehealth: Payer: Self-pay | Admitting: Internal Medicine

## 2013-12-07 NOTE — Telephone Encounter (Signed)
Relevant patient education mailed to patient.  

## 2013-12-08 ENCOUNTER — Other Ambulatory Visit: Payer: Self-pay | Admitting: Internal Medicine

## 2013-12-08 DIAGNOSIS — E876 Hypokalemia: Secondary | ICD-10-CM

## 2013-12-09 ENCOUNTER — Other Ambulatory Visit (INDEPENDENT_AMBULATORY_CARE_PROVIDER_SITE_OTHER): Payer: Medicare Other

## 2013-12-09 DIAGNOSIS — E876 Hypokalemia: Secondary | ICD-10-CM

## 2013-12-09 LAB — POTASSIUM: POTASSIUM: 3.1 meq/L — AB (ref 3.5–5.1)

## 2014-01-02 ENCOUNTER — Other Ambulatory Visit: Payer: Self-pay | Admitting: Internal Medicine

## 2014-01-02 DIAGNOSIS — E876 Hypokalemia: Secondary | ICD-10-CM

## 2014-01-03 ENCOUNTER — Other Ambulatory Visit (INDEPENDENT_AMBULATORY_CARE_PROVIDER_SITE_OTHER): Payer: Medicare Other

## 2014-01-03 DIAGNOSIS — E876 Hypokalemia: Secondary | ICD-10-CM

## 2014-01-03 LAB — POTASSIUM: POTASSIUM: 3.5 meq/L (ref 3.5–5.1)

## 2014-01-03 NOTE — Addendum Note (Signed)
Addended by: Ellamae Sia on: 01/03/2014 12:44 PM   Modules accepted: Orders

## 2014-01-03 NOTE — Addendum Note (Signed)
Addended by: Ellamae Sia on: 01/03/2014 02:50 PM   Modules accepted: Orders

## 2014-03-28 ENCOUNTER — Encounter: Payer: Self-pay | Admitting: Internal Medicine

## 2014-03-28 ENCOUNTER — Ambulatory Visit (INDEPENDENT_AMBULATORY_CARE_PROVIDER_SITE_OTHER)
Admission: RE | Admit: 2014-03-28 | Discharge: 2014-03-28 | Disposition: A | Discharge status: Home / Self Care | Payer: Medicare Other | Source: Ambulatory Visit | Attending: Internal Medicine | Admitting: Internal Medicine

## 2014-03-28 ENCOUNTER — Ambulatory Visit (INDEPENDENT_AMBULATORY_CARE_PROVIDER_SITE_OTHER): Payer: Medicare Other | Admitting: Internal Medicine

## 2014-03-28 VITALS — BP 128/80 | HR 75 | Temp 98.6°F | Wt 160.0 lb

## 2014-03-28 DIAGNOSIS — M79609 Pain in unspecified limb: Secondary | ICD-10-CM

## 2014-03-28 DIAGNOSIS — M79644 Pain in right finger(s): Secondary | ICD-10-CM | POA: Insufficient documentation

## 2014-03-28 NOTE — Progress Notes (Signed)
Pre visit review using our clinic review tool, if applicable. No additional management support is needed unless otherwise documented below in the visit note. 

## 2014-03-28 NOTE — Progress Notes (Signed)
Subjective:    Patient ID: Kathy Acosta, female    DOB: 04-Jun-1946, 68 y.o.   MRN: [id number]  HPI Having trouble with right thumb Clicking when she tries to bend it  Bad for 3 weeks Figures it is arthritis Right handed--hurts with activites  No known injury Has used a cream and wrapped it at night Aleve--no clear help  Current Outpatient Prescriptions on File Prior to Visit  Medication Sig Dispense Refill  . amLODipine (NORVASC) 10 MG tablet Take 1 tablet (10 mg total) by mouth daily.  90 tablet  3  . ascorbic acid (VITAMIN C) 500 MG tablet Take 500 mg by mouth daily.        Marland Kitchen aspirin 81 MG tablet Take 81 mg by mouth daily.        . benazepril (LOTENSIN) 20 MG tablet Take 1 tablet (20 mg total) by mouth daily.  90 tablet  3  . calcium-vitamin D (OSCAL WITH D) 500-200 MG-UNIT per tablet Take 1 tablet by mouth daily.        . fish oil-omega-3 fatty acids 1000 MG capsule Take 1 g by mouth daily.        Marland Kitchen glucosamine-chondroitin 500-400 MG tablet Take 2 tablets by mouth daily.        Marland Kitchen losartan-hydrochlorothiazide (HYZAAR) 100-12.5 MG per tablet Take 1 tablet by mouth daily.  90 tablet  3  . Multiple Vitamins-Minerals (ULTRA MEGA PO) Take 2 capsules by mouth daily.         No current facility-administered medications on file prior to visit.    Allergies  Allergen Reactions  . Doxazosin     dizziness    Past Medical History  Diagnosis Date  . Hypertension   . Arthritis   . Hx of colonic polyps   . Impaired fasting glucose   . Essential tremor     Past Surgical History  Procedure Laterality Date  . Cesarean section    . Abdominal hysterectomy    . Cervical discectomy  1992    Family History  Problem Relation Age of Onset  . Hypertension Mother   . Hypertension Father   . Diabetes Father   . Diabetes Brother   . Cancer Paternal Aunt     colon  . Diabetes Brother     History   Social History  . Marital Status: Widowed    Spouse Name: N/A    Number of  Children: 2  . Years of Education: N/A   Occupational History  . retired- Games developer. [employer]    Social History Main Topics  . Smoking status: Never Smoker   . Smokeless tobacco: Never Used  . Alcohol Use: Yes     Comment: wine  . Drug Use: Not on file  . Sexual Activity: Not on file   Other Topics Concern  . Not on file   Social History Narrative   Walks occasionally   No living will   Requests sons to be health care POA   Would like attempts at resuscitation.   Probably wouldn't want tube feeds   Review of Systems No other joint complaints  No fever--hasn't been sick     Objective:   Physical Exam  Constitutional: She appears well-developed and well-nourished. No distress.  Musculoskeletal:  No swelling of right thumb No tenderness at [facility] Is tender at PIP and DIP Click with extension at DIP---this is especially painful          Assessment & Plan:

## 2014-03-28 NOTE — Assessment & Plan Note (Signed)
No [facility] findings Will check x-ray for DIP and PIP but this may mostly be extensor tendinitis Will set up for injection with Dr Lorelei Pont Increase aleve to 2 bid Try ice

## 2014-03-28 NOTE — Patient Instructions (Addendum)
Please try intermittent ice on your thumb Increase the aleve to 2 twice a day for the next week or 2 Please set up an appointment with Dr Lorelei Pont for an injection in your finger.

## 2014-04-03 ENCOUNTER — Encounter: Payer: Self-pay | Admitting: Family Medicine

## 2014-04-03 ENCOUNTER — Ambulatory Visit (INDEPENDENT_AMBULATORY_CARE_PROVIDER_SITE_OTHER): Payer: Medicare Other | Admitting: Family Medicine

## 2014-04-03 VITALS — BP 140/80 | HR 73 | Temp 98.3°F | Ht 62.0 in | Wt 161.5 lb

## 2014-04-03 DIAGNOSIS — M65311 Trigger thumb, right thumb: Secondary | ICD-10-CM

## 2014-04-03 DIAGNOSIS — M6530 Trigger finger, unspecified finger: Secondary | ICD-10-CM

## 2014-04-03 NOTE — Progress Notes (Signed)
Pre visit review using our clinic review tool, if applicable. No additional management support is needed unless otherwise documented below in the visit note. 

## 2014-04-03 NOTE — Progress Notes (Signed)
[place] [address] Phone: [phone] Fax: [phone]  Patient ID: Kathy Acosta MRN: [id number], DOB: 09-14-1946, 68 y.o. Date of Encounter: 04/03/2014  Primary Physician:  Viviana Simpler, MD   Chief Complaint: Thumb Pain   Subjective:   History of Present Illness:  Kathy Acosta is a 68 y.o. very pleasant female patient who presents with the following:  The patient has had some pain in triggering on the first digit on the right for the last month or so. She is not really having any pain in the [facility] joint. It is really more focally around the trigger finger. She also has some soft tissue tenderness and pain around the flexor tendon. She has minimal changes round her other joints.  Past Medical History, Surgical History, Social History, Family History, Problem List, Medications, and Allergies have been reviewed and updated if relevant.  Review of Systems:  GEN: No fevers, chills. Nontoxic. Primarily MSK c/o today. MSK: Detailed in the HPI GI: tolerating PO intake without difficulty Neuro: No numbness, parasthesias, or tingling associated. Otherwise the pertinent positives of the ROS are noted above.   Objective:   Physical Examination: BP 140/80  Pulse 73  Temp(Src) 98.3 F (36.8 C) (Oral)  Ht 5\' 2"  (1.575 m)  Wt 161 lb 8 oz (73.256 kg)  BMI 29.53 kg/m2   GEN: WDWN, NAD, Non-toxic, Alert & Oriented x 3 HEENT: Atraumatic, Normocephalic.  Ears and Nose: No external deformity. EXTR: No clubbing/cyanosis/edema NEURO: Normal gait.  PSYCH: Normally interactive. Conversant. Not depressed or anxious appearing.  Calm demeanor.    Right first digit with obvious her finger. There is a painful nodule in it clicks and pops when she tries to move it. There is no significant crepitus with motion at the [facility] joint. Wrist range of motion is full. No significant osteoarthritic changes that are apparent in any other joint or MCP in the fingers her  MCPs.  Radiology: Dg Finger Thumb Right  03/28/2014   CLINICAL DATA:  Right thumb pain without injury.  EXAM: RIGHT THUMB 2+V  COMPARISON:  None.  FINDINGS: There is no evidence of fracture or dislocation. Mild narrowing of the first interphalangeal joint is noted with associated osteophyte formation consistent with degenerative joint disease. 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 No fracture or dislocation is seen.   Electronically Signed   By: Sabino Dick M.D.   On: 03/28/2014 12:03    Assessment & Plan:   Trigger thumb of right hand  Classic history and presentation. I would treat as such.  Trigger Finger Injection, RIGHT Verbal consent was obtained. Risks (including rare risk of infection, potential risk for skin lightening and potential atrophy), benefits and alternatives were discussed. Prepped with Chloraprep and Ethyl Chloride used for anesthesia. Under sterile conditions, patient injected at palmar / thumb crease aiming distally with 45 degree angle towards nodule; injected directly into tendon sheath. Medication flowed freely without resistance.  Needle size: 22 gauge 1 1/2 inch Injection: 1/2 cc of Lidocaine 1% and Depo-Medrol 20 mg   Signed,  Milee Qualls T. Alvia Jablonski, MD, [place]   Patient's Medications  New Prescriptions   No medications on file  Previous Medications   AMLODIPINE (NORVASC) 10 MG TABLET    Take 1 tablet (10 mg total) by mouth daily.   ASCORBIC ACID (VITAMIN C) 500 MG TABLET    Take 500 mg by mouth daily.     ASPIRIN 81  MG TABLET    Take 81 mg by mouth daily.     BENAZEPRIL (LOTENSIN) 20 MG TABLET    Take 1 tablet (20 mg total) by mouth daily.   CALCIUM-VITAMIN D (OSCAL WITH D) 500-200 MG-UNIT PER TABLET    Take 1 tablet by mouth daily.     FISH OIL-OMEGA-3 FATTY ACIDS 1000 MG CAPSULE    Take 1 g by mouth daily.     GLUCOSAMINE-CHONDROITIN 500-400 MG TABLET    Take 2 tablets by  mouth daily.     LOSARTAN-HYDROCHLOROTHIAZIDE (HYZAAR) 100-12.5 MG PER TABLET    Take 1 tablet by mouth daily.   MULTIPLE VITAMINS-MINERALS (ULTRA MEGA PO)    Take 2 capsules by mouth daily.    Modified Medications   No medications on file  Discontinued Medications   No medications on file

## 2014-05-16 ENCOUNTER — Encounter: Payer: Self-pay | Admitting: Internal Medicine

## 2014-05-16 ENCOUNTER — Ambulatory Visit (INDEPENDENT_AMBULATORY_CARE_PROVIDER_SITE_OTHER): Payer: Medicare Other | Admitting: Internal Medicine

## 2014-05-16 VITALS — BP 128/80 | HR 76 | Temp 98.2°F | Wt 159.0 lb

## 2014-05-16 DIAGNOSIS — I10 Essential (primary) hypertension: Secondary | ICD-10-CM

## 2014-05-16 DIAGNOSIS — R002 Palpitations: Secondary | ICD-10-CM | POA: Insufficient documentation

## 2014-05-16 NOTE — Assessment & Plan Note (Signed)
BP Readings from Last 3 Encounters:  05/16/14 128/80  04/03/14 140/80  03/28/14 128/80   Good control Labs next time

## 2014-05-16 NOTE — Assessment & Plan Note (Addendum)
Could be PSVT but unlikely since always at rest Fairly uncommon--- and no associated symptoms EKG completely normal If more frequent--would change amlodipine to diltiazem

## 2014-05-16 NOTE — Progress Notes (Signed)
Subjective:    Patient ID: Kathy Acosta, female    DOB: September 25, 1946, 68 y.o.   MRN: [id number]  HPI Here for check up  Still has clicking in right thumb No pain though Overall is better  Will periodically have a palpitation May last as long as several minutes Always at rest No SOB, diaphoresis, nausea, chest pain As often as twice a week Indicates fairly fast rate  No regular exercise but stays busy Does walk regularly, tend to house and plants  Current Outpatient Prescriptions on File Prior to Visit  Medication Sig Dispense Refill  . amLODipine (NORVASC) 10 MG tablet Take 1 tablet (10 mg total) by mouth daily.  90 tablet  3  . ascorbic acid (VITAMIN C) 500 MG tablet Take 500 mg by mouth daily.        Marland Kitchen aspirin 81 MG tablet Take 81 mg by mouth daily.        . benazepril (LOTENSIN) 20 MG tablet Take 1 tablet (20 mg total) by mouth daily.  90 tablet  3  . calcium-vitamin D (OSCAL WITH D) 500-200 MG-UNIT per tablet Take 1 tablet by mouth daily.        . fish oil-omega-3 fatty acids 1000 MG capsule Take 1 g by mouth daily.        Marland Kitchen glucosamine-chondroitin 500-400 MG tablet Take 2 tablets by mouth daily.        Marland Kitchen losartan-hydrochlorothiazide (HYZAAR) 100-12.5 MG per tablet Take 1 tablet by mouth daily.  90 tablet  3  . Multiple Vitamins-Minerals (ULTRA MEGA PO) Take 2 capsules by mouth daily.         No current facility-administered medications on file prior to visit.    Allergies  Allergen Reactions  . Doxazosin     dizziness    Past Medical History  Diagnosis Date  . Hypertension   . Arthritis   . Hx of colonic polyps   . Impaired fasting glucose   . Essential tremor     Past Surgical History  Procedure Laterality Date  . Cesarean section    . Abdominal hysterectomy    . Cervical discectomy  1992    Family History  Problem Relation Age of Onset  . Hypertension Mother   . Hypertension Father   . Diabetes Father   . Diabetes Brother   . Cancer Paternal Aunt      colon  . Diabetes Brother     History   Social History  . Marital Status: Widowed    Spouse Name: N/A    Number of Children: 2  . Years of Education: N/A   Occupational History  . retired- Games developer. [employer]    Social History Main Topics  . Smoking status: Never Smoker   . Smokeless tobacco: Never Used  . Alcohol Use: Yes     Comment: wine  . Drug Use: Not on file  . Sexual Activity: Not on file   Other Topics Concern  . Not on file   Social History Narrative   Walks occasionally   No living will   Requests sons to be health care POA   Would like attempts at resuscitation.   Probably wouldn't want tube feeds   Review of Systems Appetite is fine Weight down 2# No fatigue feelings now Some residual back pain after pulling something stretching into dryer    Objective:   Physical Exam  Constitutional: She appears well-developed and well-nourished. No distress.  Neck: Normal range of  motion. No thyromegaly present.  Cardiovascular: Normal rate, regular rhythm and normal heart sounds.  Exam reveals no gallop.   No murmur heard. Pulmonary/Chest: Effort normal and breath sounds normal. No respiratory distress. She has no wheezes. She has no rales.  Musculoskeletal: She exhibits no edema.  Lymphadenopathy:    She has no cervical adenopathy.  Psychiatric: She has a normal mood and affect. Her behavior is normal.          Assessment & Plan:

## 2014-05-16 NOTE — Progress Notes (Signed)
Pre visit review using our clinic review tool, if applicable. No additional management support is needed unless otherwise documented below in the visit note. 

## 2014-05-17 ENCOUNTER — Telehealth: Payer: Self-pay | Admitting: Internal Medicine

## 2014-05-17 NOTE — Telephone Encounter (Signed)
Relevant patient education assigned to patient using Emmi. ° °

## 2014-06-26 ENCOUNTER — Other Ambulatory Visit: Payer: Self-pay

## 2014-06-26 DIAGNOSIS — Z1231 Encounter for screening mammogram for malignant neoplasm of breast: Secondary | ICD-10-CM

## 2014-08-03 ENCOUNTER — Ambulatory Visit
Admission: RE | Admit: 2014-08-03 | Discharge: 2014-08-03 | Disposition: A | Discharge status: Home / Self Care | Payer: Medicare Other | Source: Ambulatory Visit

## 2014-08-03 DIAGNOSIS — Z1231 Encounter for screening mammogram for malignant neoplasm of breast: Secondary | ICD-10-CM

## 2014-08-18 ENCOUNTER — Ambulatory Visit (INDEPENDENT_AMBULATORY_CARE_PROVIDER_SITE_OTHER): Payer: Medicare Other

## 2014-08-18 DIAGNOSIS — Z23 Encounter for immunization: Secondary | ICD-10-CM

## 2014-11-03 HISTORY — PX: COLONOSCOPY: SHX174

## 2014-11-21 ENCOUNTER — Encounter: Payer: Self-pay | Admitting: Internal Medicine

## 2014-11-21 ENCOUNTER — Ambulatory Visit (INDEPENDENT_AMBULATORY_CARE_PROVIDER_SITE_OTHER): Payer: Medicare Other | Admitting: Internal Medicine

## 2014-11-21 VITALS — BP 120/80 | HR 74 | Temp 97.8°F | Ht 62.0 in | Wt 160.0 lb

## 2014-11-21 DIAGNOSIS — G250 Essential tremor: Secondary | ICD-10-CM | POA: Diagnosis not present

## 2014-11-21 DIAGNOSIS — I10 Essential (primary) hypertension: Secondary | ICD-10-CM

## 2014-11-21 DIAGNOSIS — Z7189 Other specified counseling: Secondary | ICD-10-CM

## 2014-11-21 DIAGNOSIS — Z23 Encounter for immunization: Secondary | ICD-10-CM

## 2014-11-21 DIAGNOSIS — E2839 Other primary ovarian failure: Secondary | ICD-10-CM

## 2014-11-21 DIAGNOSIS — Z0000 Encounter for general adult medical examination without abnormal findings: Secondary | ICD-10-CM

## 2014-11-21 LAB — CBC WITH DIFFERENTIAL/PLATELET
BASOS ABS: 0 10*3/uL (ref 0.0–0.1)
BASOS PCT: 0.5 % (ref 0.0–3.0)
Eosinophils Absolute: 0.1 10*3/uL (ref 0.0–0.7)
Eosinophils Relative: 2.3 % (ref 0.0–5.0)
HEMATOCRIT: 39.5 % (ref 36.0–46.0)
Hemoglobin: 12.8 g/dL (ref 12.0–15.0)
LYMPHS ABS: 2 10*3/uL (ref 0.7–4.0)
Lymphocytes Relative: 39.2 % (ref 12.0–46.0)
MCHC: 32.4 g/dL (ref 30.0–36.0)
MCV: 85.7 fl (ref 78.0–100.0)
MONO ABS: 0.5 10*3/uL (ref 0.1–1.0)
MONOS PCT: 9.5 % (ref 3.0–12.0)
Neutro Abs: 2.5 10*3/uL (ref 1.4–7.7)
Neutrophils Relative %: 48.5 % (ref 43.0–77.0)
PLATELETS: 248 10*3/uL (ref 150.0–400.0)
RBC: 4.61 Mil/uL (ref 3.87–5.11)
RDW: 13.8 % (ref 11.5–15.5)
WBC: 5.2 10*3/uL (ref 4.0–10.5)

## 2014-11-21 LAB — LIPID PANEL
Cholesterol: 160 mg/dL (ref 0–200)
HDL: 44.4 mg/dL
LDL Cholesterol: 91 mg/dL (ref 0–99)
NonHDL: 115.6
Total CHOL/HDL Ratio: 4
Triglycerides: 123 mg/dL (ref 0.0–149.0)
VLDL: 24.6 mg/dL (ref 0.0–40.0)

## 2014-11-21 LAB — T4, FREE: Free T4: 0.85 ng/dL (ref 0.60–1.60)

## 2014-11-21 LAB — COMPREHENSIVE METABOLIC PANEL WITH GFR
ALT: 37 U/L — ABNORMAL HIGH (ref 0–35)
AST: 42 U/L — ABNORMAL HIGH (ref 0–37)
Albumin: 4.1 g/dL (ref 3.5–5.2)
Alkaline Phosphatase: 68 U/L (ref 39–117)
BUN: 18 mg/dL (ref 6–23)
CO2: 32 meq/L (ref 19–32)
Calcium: 9.9 mg/dL (ref 8.4–10.5)
Chloride: 102 meq/L (ref 96–112)
Creatinine, Ser: 0.78 mg/dL (ref 0.40–1.20)
GFR: 93.78 mL/min
Glucose, Bld: 112 mg/dL — ABNORMAL HIGH (ref 70–99)
Potassium: 3.6 meq/L (ref 3.5–5.1)
Sodium: 139 meq/L (ref 135–145)
Total Bilirubin: 0.5 mg/dL (ref 0.2–1.2)
Total Protein: 7.4 g/dL (ref 6.0–8.3)

## 2014-11-21 NOTE — Assessment & Plan Note (Signed)
See social history Blank forms given 

## 2014-11-21 NOTE — Assessment & Plan Note (Addendum)
I have personally reviewed the Medicare Annual Wellness questionnaire and have noted 1. The patient's medical and social history 2. Their use of alcohol, tobacco or illicit drugs 3. Their current medications and supplements 4. The patient's functional ability including ADL's, fall risks, home safety risks and hearing or visual             impairment. 5. Diet and physical activities 6. Evidence for depression or mood disorders  The patients weight, height, BMI and visual acuity have been recorded in the chart I have made referrals, counseling and provided education to the patient based review of the above and I have provided the pt with a written personalized care plan for preventive services.  I have provided you with a copy of your personalized plan for preventive services. Please take the time to review along with your updated medication list.  prevnar today Will be due for 5 year colonoscopy recall later this year (history of polyps) Discussed exercise Mammogram due late 2017. No breast exam after discussion DEXA ordered

## 2014-11-21 NOTE — Progress Notes (Signed)
Subjective:    Patient ID: Kathy Acosta, female    DOB: 04-07-1946, 69 y.o.   MRN: [id number]  HPI Here for Medicare wellness and follow up of HTN and other medical conditions Reviewed form and advanced directives No falls Lost oldest sister recently--some grieving but no major depression. No anhedonia No alcohol or tobacco No set exercise but stays busy Independent in instrumental ADLs Vision and hearing are fine Reviewed other physicians--doesn't see anyone else No cognitive changes  Still has pain in right thumb At [facility] joint Discussed capsaicin or hot wax--she rubs things on it at night  Palpitations still occur--but seems better No chest pain No SOB No dizziness or syncope  Current Outpatient Prescriptions on File Prior to Visit  Medication Sig Dispense Refill  . amLODipine (NORVASC) 10 MG tablet Take 1 tablet (10 mg total) by mouth daily. 90 tablet 3  . ascorbic acid (VITAMIN C) 500 MG tablet Take 500 mg by mouth daily.      Marland Kitchen aspirin 81 MG tablet Take 81 mg by mouth daily.      . benazepril (LOTENSIN) 20 MG tablet Take 1 tablet (20 mg total) by mouth daily. 90 tablet 3  . calcium-vitamin D (OSCAL WITH D) 500-200 MG-UNIT per tablet Take 1 tablet by mouth daily.      . fish oil-omega-3 fatty acids 1000 MG capsule Take 1 g by mouth daily.      Marland Kitchen glucosamine-chondroitin 500-400 MG tablet Take 2 tablets by mouth daily.      Marland Kitchen losartan-hydrochlorothiazide (HYZAAR) 100-12.5 MG per tablet Take 1 tablet by mouth daily. 90 tablet 3  . Multiple Vitamins-Minerals (ULTRA MEGA PO) Take 2 capsules by mouth daily.       No current facility-administered medications on file prior to visit.    Allergies  Allergen Reactions  . Doxazosin     dizziness    Past Medical History  Diagnosis Date  . Hypertension   . Arthritis   . Hx of colonic polyps   . Impaired fasting glucose   . Essential tremor     Past Surgical History  Procedure Laterality Date  . Cesarean section    .  Abdominal hysterectomy    . Cervical discectomy  1992    Family History  Problem Relation Age of Onset  . Hypertension Mother   . Hypertension Father   . Diabetes Father   . Diabetes Brother   . Cancer Paternal Aunt     colon  . Diabetes Brother   . Heart disease Sister     History   Social History  . Marital Status: Widowed    Spouse Name: N/A    Number of Children: 2  . Years of Education: N/A   Occupational History  . retired- Games developer. [employer]    Social History Main Topics  . Smoking status: Never Smoker   . Smokeless tobacco: Never Used  . Alcohol Use: Yes     Comment: wine  . Drug Use: Not on file  . Sexual Activity: Not on file   Other Topics Concern  . Not on file   Social History Narrative   Walks occasionally   No living will   Requests sons to be health care POA   Would like attempts at resuscitation.   Probably wouldn't want tube feeds   Review of Systems  Sleep is occasionally not great---trouble initiating but not often Appetite is fine Weight stable Bowels are fine No urinary problems---no incontinence Regular  with dentist Wears seat belt     Objective:   Physical Exam  Constitutional: She is oriented to person, place, and time. She appears well-developed and well-nourished. No distress.  HENT:  Mouth/Throat: Oropharynx is clear and moist. No oropharyngeal exudate.  Neck: Normal range of motion. Neck supple. No thyromegaly present.  Cardiovascular: Normal rate, regular rhythm, normal heart sounds and intact distal pulses.  Exam reveals no gallop.   No murmur heard. Pulmonary/Chest: Effort normal and breath sounds normal. No respiratory distress. She has no wheezes. She has no rales.  Abdominal: Soft. There is tenderness.  Musculoskeletal: She exhibits no edema or tenderness.  Lymphadenopathy:    She has no cervical adenopathy.  Neurological: She is alert and oriented to person, place, and time.  President-- "Obama,  Anne Hahn, then [employer] [phone]-   "I need a calculator" D-l-r-[id number]-w Recall 2/3  Skin: No rash noted. No erythema.  Psychiatric: She has a normal mood and affect. Her behavior is normal.          Assessment & Plan:

## 2014-11-21 NOTE — Assessment & Plan Note (Signed)
BP Readings from Last 3 Encounters:  11/21/14 120/80  05/16/14 128/80  04/03/14 140/80   Good control on her regimen No changes needed

## 2014-11-21 NOTE — Assessment & Plan Note (Signed)
Very mild in hands No Rx needed

## 2014-11-21 NOTE — Progress Notes (Signed)
Pre visit review using our clinic review tool, if applicable. No additional management support is needed unless otherwise documented below in the visit note. 

## 2014-11-21 NOTE — Addendum Note (Signed)
Addended by: Despina Hidden on: 11/21/2014 03:17 PM   Modules accepted: Orders

## 2014-12-07 ENCOUNTER — Ambulatory Visit
Admission: RE | Admit: 2014-12-07 | Discharge: 2014-12-07 | Disposition: A | Discharge status: Home / Self Care | Payer: Medicare Other | Source: Ambulatory Visit | Attending: Internal Medicine | Admitting: Internal Medicine

## 2014-12-07 DIAGNOSIS — M8588 Other specified disorders of bone density and structure, other site: Secondary | ICD-10-CM | POA: Diagnosis not present

## 2014-12-07 DIAGNOSIS — E2839 Other primary ovarian failure: Secondary | ICD-10-CM

## 2014-12-07 DIAGNOSIS — Z780 Asymptomatic menopausal state: Secondary | ICD-10-CM | POA: Diagnosis not present

## 2014-12-07 DIAGNOSIS — M85852 Other specified disorders of bone density and structure, left thigh: Secondary | ICD-10-CM | POA: Diagnosis not present

## 2015-01-18 ENCOUNTER — Encounter: Payer: Self-pay | Admitting: Internal Medicine

## 2015-01-18 MED ORDER — AMLODIPINE BESYLATE 10 MG PO TABS: 10.0000 mg | ORAL_TABLET | Freq: Every day | ORAL | Status: DC

## 2015-01-18 MED ORDER — LOSARTAN POTASSIUM-HCTZ 100-12.5 MG PO TABS: 1.0000 | ORAL_TABLET | Freq: Every day | ORAL | Status: DC

## 2015-01-18 MED ORDER — BENAZEPRIL HCL 20 MG PO TABS: 20.0000 mg | ORAL_TABLET | Freq: Every day | ORAL | Status: DC

## 2015-05-21 ENCOUNTER — Encounter: Payer: Self-pay | Admitting: Internal Medicine

## 2015-05-21 DIAGNOSIS — Z800 Family history of malignant neoplasm of digestive organs: Secondary | ICD-10-CM | POA: Insufficient documentation

## 2015-05-22 ENCOUNTER — Encounter: Payer: Self-pay | Admitting: Internal Medicine

## 2015-05-22 ENCOUNTER — Ambulatory Visit (INDEPENDENT_AMBULATORY_CARE_PROVIDER_SITE_OTHER): Payer: Medicare Other | Admitting: Internal Medicine

## 2015-05-22 ENCOUNTER — Telehealth: Payer: Self-pay | Admitting: Internal Medicine

## 2015-05-22 VITALS — BP 140/70 | HR 77 | Temp 98.2°F | Wt 162.0 lb

## 2015-05-22 DIAGNOSIS — G250 Essential tremor: Secondary | ICD-10-CM

## 2015-05-22 DIAGNOSIS — I10 Essential (primary) hypertension: Secondary | ICD-10-CM

## 2015-05-22 NOTE — Assessment & Plan Note (Signed)
Mild Still not enough to warrant Rx Would consider beta blocker if worsened

## 2015-05-22 NOTE — Assessment & Plan Note (Signed)
BP Readings from Last 3 Encounters:  05/22/15 140/70  11/21/14 120/80  05/16/14 128/80   Generally good control No changes needed

## 2015-05-22 NOTE — Telephone Encounter (Signed)
Please set this up for her-- PPD

## 2015-05-22 NOTE — Progress Notes (Signed)
Subjective:    Patient ID: Kathy Acosta, female    DOB: [birth year], 69 y.o.   MRN: [id number]  HPI Here for follow up of HTN and other medical conditions  Had exhausted feeling again about 2 weeks ago Waited it out and now feeling better No fever No cough or breathing problems  No change in exertion tolerance No chest pain Occasional brief palpitations-- goes away with deep breath  Current Outpatient Prescriptions on File Prior to Visit  Medication Sig Dispense Refill  . amLODipine (NORVASC) 10 MG tablet Take 1 tablet (10 mg total) by mouth daily. 90 tablet 3  . ascorbic acid (VITAMIN C) 500 MG tablet Take 500 mg by mouth daily.      Marland Kitchen aspirin 81 MG tablet Take 81 mg by mouth daily.      . benazepril (LOTENSIN) 20 MG tablet Take 1 tablet (20 mg total) by mouth daily. 90 tablet 3  . calcium-vitamin D (OSCAL WITH D) 500-200 MG-UNIT per tablet Take 1 tablet by mouth daily.      . fish oil-omega-3 fatty acids 1000 MG capsule Take 1 g by mouth daily.      Marland Kitchen glucosamine-chondroitin 500-400 MG tablet Take 2 tablets by mouth daily.      Marland Kitchen losartan-hydrochlorothiazide (HYZAAR) 100-12.5 MG per tablet Take 1 tablet by mouth daily. 90 tablet 3  . Multiple Vitamins-Minerals (ULTRA MEGA PO) Take 2 capsules by mouth daily.       No current facility-administered medications on file prior to visit.    Allergies  Allergen Reactions  . Doxazosin     dizziness    Past Medical History  Diagnosis Date  . Hypertension   . Arthritis   . Hx of colonic polyps   . Impaired fasting glucose   . Essential tremor     Past Surgical History  Procedure Laterality Date  . Cesarean section    . Abdominal hysterectomy    . Cervical discectomy  1992    Family History  Problem Relation Age of Onset  . Hypertension Mother   . Hypertension Father   . Diabetes Father   . Diabetes Brother   . Cancer Paternal Aunt     colon  . Diabetes Brother   . Heart disease Sister     History   Social  History  . Marital Status: Widowed    Spouse Name: N/A  . Number of Children: 2  . Years of Education: N/A   Occupational History  . retired- Games developer. [employer]    Social History Main Topics  . Smoking status: Never Smoker   . Smokeless tobacco: Never Used  . Alcohol Use: Yes     Comment: wine  . Drug Use: Not on file  . Sexual Activity: Not on file   Other Topics Concern  . Not on file   Social History Narrative   Walks occasionally   No living will   Requests sons to be health care POA   Would like attempts at resuscitation.   Probably wouldn't want tube feeds   Review of Systems Appetite is okay Weight is fairly stable Tries to be active---walks some, keeps up house    Objective:   Physical Exam  Constitutional: She appears well-developed and well-nourished. No distress.  Neck: Normal range of motion. Neck supple. No thyromegaly present.  Cardiovascular: Normal rate, regular rhythm and normal heart sounds.  Exam reveals no gallop.   No murmur heard. Pulmonary/Chest: Effort normal and breath sounds  normal. No respiratory distress. She has no wheezes. She has no rales.  Musculoskeletal: She exhibits no edema.  Lymphadenopathy:    She has no cervical adenopathy.  Psychiatric: She has a normal mood and affect. Her behavior is normal.          Assessment & Plan:

## 2015-05-22 NOTE — Progress Notes (Signed)
Pre visit review using our clinic review tool, if applicable. No additional management support is needed unless otherwise documented below in the visit note. 

## 2015-05-22 NOTE — Telephone Encounter (Signed)
Pt got a new job and needs a tb test. Best number to call is [phone]

## 2015-05-23 NOTE — Telephone Encounter (Signed)
Just schedule her for a nurse visit.

## 2015-05-23 NOTE — Telephone Encounter (Signed)
Pt called to schedule TB skin test; pt scheduled nurse visit for 05/25/15.

## 2015-05-25 ENCOUNTER — Ambulatory Visit (INDEPENDENT_AMBULATORY_CARE_PROVIDER_SITE_OTHER): Payer: Medicare Other | Admitting: *Deleted

## 2015-05-25 DIAGNOSIS — Z111 Encounter for screening for respiratory tuberculosis: Secondary | ICD-10-CM

## 2015-05-28 LAB — TB SKIN TEST: TB Skin Test: NEGATIVE

## 2015-06-05 ENCOUNTER — Encounter: Payer: Self-pay | Admitting: Internal Medicine

## 2015-06-26 ENCOUNTER — Ambulatory Visit (AMBULATORY_SURGERY_CENTER): Payer: Self-pay

## 2015-06-26 VITALS — Ht 62.0 in | Wt 161.6 lb

## 2015-06-26 DIAGNOSIS — Z800 Family history of malignant neoplasm of digestive organs: Secondary | ICD-10-CM

## 2015-06-26 DIAGNOSIS — Z86010 Personal history of colonic polyps: Secondary | ICD-10-CM

## 2015-06-26 NOTE — Progress Notes (Signed)
Per pt, no allergies to soy or egg products.Pt not taking any weight loss meds or using  O2 at home. 

## 2015-07-06 ENCOUNTER — Other Ambulatory Visit: Payer: Self-pay

## 2015-07-06 DIAGNOSIS — Z1231 Encounter for screening mammogram for malignant neoplasm of breast: Secondary | ICD-10-CM

## 2015-07-17 ENCOUNTER — Ambulatory Visit (AMBULATORY_SURGERY_CENTER): Payer: Medicare Other | Admitting: Internal Medicine

## 2015-07-17 ENCOUNTER — Encounter: Payer: Self-pay | Admitting: Internal Medicine

## 2015-07-17 VITALS — BP 145/91 | HR 65 | Temp 97.0°F | Resp 26

## 2015-07-17 DIAGNOSIS — Z86010 Personal history of colonic polyps: Secondary | ICD-10-CM

## 2015-07-17 DIAGNOSIS — Z800 Family history of malignant neoplasm of digestive organs: Secondary | ICD-10-CM | POA: Diagnosis not present

## 2015-07-17 DIAGNOSIS — K635 Polyp of colon: Secondary | ICD-10-CM

## 2015-07-17 DIAGNOSIS — D125 Benign neoplasm of sigmoid colon: Secondary | ICD-10-CM

## 2015-07-17 MED ORDER — SODIUM CHLORIDE 0.9 % IV SOLN: 500.0000 mL | INTRAVENOUS | Status: DC

## 2015-07-17 NOTE — Progress Notes (Signed)
Report to PACU, RN, vss, BBS= Clear.  

## 2015-07-17 NOTE — Op Note (Signed)
[place]  [address]. [place], [address]   COLONOSCOPY PROCEDURE REPORT  PATIENT: Kathy, Acosta  MR#: [id number] BIRTHDATE: 03-11-1946 , 69  yrs. old GENDER: female ENDOSCOPIST: Gatha Mayer, MD, [facility] PROCEDURE DATE:  07/17/2015 PROCEDURE:   Colonoscopy, surveillance and Colonoscopy with snare polypectomy First Screening Colonoscopy - Avg.  risk and is 50 yrs.  old or older - No.  Prior Negative Screening - Now for repeat screening. N/A  History of Adenoma - Now for follow-up colonoscopy & has been > or = to 3 yrs.  Yes hx of adenoma.  Has been 3 or more years since last colonoscopy.  Polyps removed today? Yes ASA CLASS:   Class II INDICATIONS:Surveillance due to prior colonic neoplasia, FH Colon or Rectal Adenocarcinoma, and PH Colon Adenoma. MEDICATIONS: Propofol 200 mg IV and Monitored anesthesia care  DESCRIPTION OF PROCEDURE:   After the risks benefits and alternatives of the procedure were thoroughly explained, informed consent was obtained.  The digital rectal exam revealed no abnormalities of the rectum.   The LB [id number]  endoscope was introduced through the anus and advanced to the cecum, which was identified by both the appendix and ileocecal valve. No adverse events experienced.   The quality of the prep was good.  (MiraLax was used)  The instrument was then slowly withdrawn as the colon was fully examined. Estimated blood loss is zero unless otherwise noted in this procedure report.   COLON FINDINGS: Three sessile polyps ranging from 2 to 76mm in size were found in the sigmoid colon.  Polypectomies were performed with a cold snare.  The resection was complete, the polyp tissue was completely retrieved and sent to histology.   There was mild diverticulosis noted throughout the entire examined colon.   The examination was otherwise normal.  Retroflexed views revealed no abnormalities. The time to cecum = 3.2 Withdrawal time = 13.7    The scope was withdrawn and the procedure completed. COMPLICATIONS: There were no immediate complications.  ENDOSCOPIC IMPRESSION: 1.   Three sessile polyps ranging from 2 to 88mm in size were found in the sigmoid colon; polypectomies were performed with a cold snare 2.   There was mild diverticulosis noted throughout the entire examined colon 3.   The examination was otherwise normal  RECOMMENDATIONS: Timing of repeat colonoscopy will be determined by pathology findings.  She also has FHx CRCA in several family memebers (1st and second degree)  eSigned:  Gatha Mayer, MD, [facility] 07/17/2015 3:05 PM   cc: The Patient

## 2015-07-17 NOTE — Patient Instructions (Addendum)
I found and removed 3 tiny polyps.   You also have a condition called diverticulosis - common and not usually a problem. Please read the handout provided.  I will let you know pathology results and when to have another routine colonoscopy by mail.  I appreciate the opportunity to care for you. Gatha Mayer, MD, FACG   YOU HAD AN ENDOSCOPIC PROCEDURE TODAY AT [facility]:   Refer to the procedure report that was given to you for any specific questions about what was found during the examination.  If the procedure report does not answer your questions, please call your gastroenterologist to clarify.  If you requested that your care partner not be given the details of your procedure findings, then the procedure report has been included in a sealed envelope for you to review at your convenience later.  YOU SHOULD EXPECT: Some feelings of bloating in the abdomen. Passage of more gas than usual.  Walking can help get rid of the air that was put into your GI tract during the procedure and reduce the bloating. If you had a lower endoscopy (such as a colonoscopy or flexible sigmoidoscopy) you may notice spotting of blood in your stool or on the toilet paper. If you underwent a bowel prep for your procedure, you may not have a normal bowel movement for a few days.  Please Note:  You might notice some irritation and congestion in your nose or some drainage.  This is from the oxygen used during your procedure.  There is no need for concern and it should clear up in a day or so.  SYMPTOMS TO REPORT IMMEDIATELY:   Following lower endoscopy (colonoscopy or flexible sigmoidoscopy):  Excessive amounts of blood in the stool  Significant tenderness or worsening of abdominal pains  Swelling of the abdomen that is new, acute  Fever of 100F or higher   For urgent or emergent issues, a gastroenterologist can be reached at any hour by calling [phone].   DIET: Your first meal  following the procedure should be a small meal and then it is ok to progress to your normal diet. Heavy or fried foods are harder to digest and may make you feel nauseous or bloated.  Likewise, meals heavy in dairy and vegetables can increase bloating.  Drink plenty of fluids but you should avoid alcoholic beverages for 24 hours.  ACTIVITY:  You should plan to take it easy for the rest of today and you should NOT DRIVE or use heavy machinery until tomorrow (because of the sedation medicines used during the test).    FOLLOW UP: Our staff will call the number listed on your records the next business day following your procedure to check on you and address any questions or concerns that you may have regarding the information given to you following your procedure. If we do not reach you, we will leave a message.  However, if you are feeling well and you are not experiencing any problems, there is no need to return our call.  We will assume that you have returned to your regular daily activities without incident.  If any biopsies were taken you will be contacted by phone or by letter within the next 1-3 weeks.  Please call us at [phone] if you have not heard about the biopsies in 3 weeks.    SIGNATURES/CONFIDENTIALITY: You and/or your care partner have signed paperwork which will be entered into your electronic medical record.  These  signatures attest to the fact that that the information above on your After Visit Summary has been reviewed and is understood.  Full responsibility of the confidentiality of this discharge information lies with you and/or your care-partner.    Handouts were given to your care partner on polyps and diverticulosis. You may resume your current medications today. Await biopsy results. Please call if any questions or concerns.

## 2015-07-17 NOTE — Progress Notes (Signed)
Called to room to assist during endoscopic procedure.  Patient ID and intended procedure confirmed with present staff. Received instructions for my participation in the procedure from the performing physician.  

## 2015-07-18 ENCOUNTER — Telehealth: Payer: Self-pay

## 2015-07-18 NOTE — Telephone Encounter (Signed)
  Follow up Call-  Call back number 07/17/2015  Post procedure Call Back phone  # [phone]  Permission to leave phone message Yes     Patient questions:  Do you have a fever, pain , or abdominal swelling? No. Pain Score  0 *  Have you tolerated food without any problems? Yes.    Have you been able to return to your normal activities? Yes.    Do you have any questions about your discharge instructions: Diet   No. Medications  No. Follow up visit  No.  Do you have questions or concerns about your Care? No.  Actions: * If pain score is 4 or above: No action needed, pain <4.

## 2015-07-31 ENCOUNTER — Encounter: Payer: Self-pay | Admitting: Internal Medicine

## 2015-07-31 NOTE — Progress Notes (Signed)
Quick Note:  Polyps not precancerous - repeat colonoscopy 2021 (FH[facility]) ______

## 2015-08-07 ENCOUNTER — Ambulatory Visit (INDEPENDENT_AMBULATORY_CARE_PROVIDER_SITE_OTHER): Payer: Medicare Other

## 2015-08-07 DIAGNOSIS — Z23 Encounter for immunization: Secondary | ICD-10-CM | POA: Diagnosis not present

## 2015-08-16 ENCOUNTER — Ambulatory Visit
Admission: RE | Admit: 2015-08-16 | Discharge: 2015-08-16 | Disposition: A | Discharge status: Home / Self Care | Payer: Medicare Other | Source: Ambulatory Visit

## 2015-08-16 DIAGNOSIS — Z1231 Encounter for screening mammogram for malignant neoplasm of breast: Secondary | ICD-10-CM

## 2015-10-21 ENCOUNTER — Other Ambulatory Visit: Payer: Self-pay | Admitting: Internal Medicine

## 2015-10-22 ENCOUNTER — Other Ambulatory Visit: Payer: Self-pay | Admitting: Internal Medicine

## 2015-11-27 ENCOUNTER — Ambulatory Visit (INDEPENDENT_AMBULATORY_CARE_PROVIDER_SITE_OTHER): Payer: Medicare Other | Admitting: Internal Medicine

## 2015-11-27 ENCOUNTER — Encounter: Payer: Self-pay | Admitting: Internal Medicine

## 2015-11-27 VITALS — BP 120/80 | HR 81 | Temp 98.2°F | Wt 155.0 lb

## 2015-11-27 DIAGNOSIS — R7301 Impaired fasting glucose: Secondary | ICD-10-CM

## 2015-11-27 DIAGNOSIS — I10 Essential (primary) hypertension: Secondary | ICD-10-CM

## 2015-11-27 DIAGNOSIS — G250 Essential tremor: Secondary | ICD-10-CM

## 2015-11-27 LAB — CBC WITH DIFFERENTIAL/PLATELET
BASOS ABS: 0 10*3/uL (ref 0.0–0.1)
Basophils Relative: 0.5 % (ref 0.0–3.0)
Eosinophils Absolute: 0.1 10*3/uL (ref 0.0–0.7)
Eosinophils Relative: 2.3 % (ref 0.0–5.0)
HCT: 40 % (ref 36.0–46.0)
Hemoglobin: 12.8 g/dL (ref 12.0–15.0)
LYMPHS ABS: 1.9 10*3/uL (ref 0.7–4.0)
Lymphocytes Relative: 35.6 % (ref 12.0–46.0)
MCHC: 32 g/dL (ref 30.0–36.0)
MCV: 85.1 fl (ref 78.0–100.0)
MONO ABS: 0.5 10*3/uL (ref 0.1–1.0)
Monocytes Relative: 9.6 % (ref 3.0–12.0)
NEUTROS ABS: 2.8 10*3/uL (ref 1.4–7.7)
Neutrophils Relative %: 52 % (ref 43.0–77.0)
PLATELETS: 271 10*3/uL (ref 150.0–400.0)
RBC: 4.7 Mil/uL (ref 3.87–5.11)
RDW: 13.5 % (ref 11.5–15.5)
WBC: 5.4 10*3/uL (ref 4.0–10.5)

## 2015-11-27 LAB — COMPREHENSIVE METABOLIC PANEL
ALK PHOS: 75 U/L (ref 39–117)
ALT: 37 U/L — ABNORMAL HIGH (ref 0–35)
AST: 41 U/L — AB (ref 0–37)
Albumin: 4.1 g/dL (ref 3.5–5.2)
BILIRUBIN TOTAL: 0.4 mg/dL (ref 0.2–1.2)
BUN: 16 mg/dL (ref 6–23)
CO2: 31 meq/L (ref 19–32)
Calcium: 9.6 mg/dL (ref 8.4–10.5)
Chloride: 101 mEq/L (ref 96–112)
Creatinine, Ser: 0.78 mg/dL (ref 0.40–1.20)
GFR: 93.51 mL/min (ref >60.00)
GLUCOSE: 104 mg/dL — AB (ref 70–99)
Potassium: 3.4 mEq/L — ABNORMAL LOW (ref 3.5–5.1)
SODIUM: 140 meq/L (ref 135–145)
TOTAL PROTEIN: 7.3 g/dL (ref 6.0–8.3)

## 2015-11-27 LAB — HEMOGLOBIN A1C: HEMOGLOBIN A1C: 6 % (ref 4.6–6.5)

## 2015-11-27 NOTE — Assessment & Plan Note (Signed)
BP Readings from Last 3 Encounters:  11/27/15 120/80  07/17/15 145/91  05/22/15 140/70   Good control No change needed  Due for labs

## 2015-11-27 NOTE — Progress Notes (Signed)
Subjective:    Patient ID: Kathy Acosta, female    DOB: 1946-04-09, 70 y.o.   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 amLODipine (NORVASC) 10 MG tablet Take 1 tablet by mouth  daily 90 tablet 3  . ascorbic acid (VITAMIN C) 500 MG tablet Take 500 mg by mouth daily.      Marland Kitchen aspirin 81 MG tablet Take 81 mg by mouth daily.      . benazepril (LOTENSIN) 20 MG tablet Take 1 tablet by mouth  daily 90 tablet 3  . calcium-vitamin D (OSCAL WITH D) 500-200 MG-UNIT per tablet Take 1 tablet by mouth daily.      . fish oil-omega-3 fatty acids 1000 MG capsule Take 1 g by mouth daily.      Marland Kitchen glucosamine-chondroitin 500-400 MG tablet Take 2 tablets by mouth daily.      Marland Kitchen losartan-hydrochlorothiazide (HYZAAR) 100-12.5 MG tablet Take 1 tablet by mouth  daily 90 tablet 3  . Multiple Vitamins-Minerals (ULTRA MEGA PO) Take 2 capsules by mouth daily.       No current facility-administered medications on file prior to visit.    Allergies  Allergen Reactions  . Doxazosin     dizziness    Past Medical History  Diagnosis Date  . Hypertension   . Arthritis   . Hx of colonic polyps   . Impaired fasting glucose   . Essential tremor     last 2 years    Past Surgical History  Procedure Laterality Date  . Cesarean section      2 times  . Abdominal hysterectomy    . Cervical discectomy  1992    2 times  . Colonoscopy      Family History  Problem Relation Age of Onset  .  Hypertension Mother   . Hypertension Father   . Diabetes Father   . Diabetes Brother   . Cancer Paternal Aunt     colon  . Colon cancer Paternal Aunt   . Diabetes Brother   . Heart disease Sister   . Colon cancer Sister     Social History   Social History  . Marital Status: Widowed    Spouse Name: N/A  . Number of Children: 2  . Years of Education: N/A   Occupational History  . retired- Games developer. [employer]    Social History Main Topics  . Smoking status: Never Smoker   . Smokeless tobacco: Never Used  . Alcohol Use: No     Comment: wine  . Drug Use: No  . Sexual Activity: Not on file   Other Topics Concern  . Not on file   Social History Narrative   Walks occasionally   No living will   Requests sons to be health care POA   Would like attempts at resuscitation.   Probably wouldn't want tube feeds   Review of Systems No depression or anhedonia Sleeps variable--- occasionally something on  her mind. No daytime somnolence    Objective:   Physical Exam  Constitutional: She appears well-developed and well-nourished. No distress.  Neck: Normal range of motion. Neck supple. No thyromegaly present.  Cardiovascular: Normal rate, regular rhythm, normal heart sounds and intact distal pulses.  Exam reveals no gallop.   No murmur heard. Pulmonary/Chest: Effort normal and breath sounds normal. No respiratory distress. She has no wheezes. She has no rales.  Abdominal: Soft. There is no tenderness.  Musculoskeletal: She exhibits no edema or tenderness.  Lymphadenopathy:    She has no cervical adenopathy.  Psychiatric: She has a normal mood and affect. Her behavior is normal.          Assessment & Plan:

## 2015-11-27 NOTE — Assessment & Plan Note (Signed)
In better shape Will recheck labs

## 2015-11-27 NOTE — Assessment & Plan Note (Signed)
Better No treatment needed

## 2015-11-27 NOTE — Progress Notes (Signed)
Pre visit review using our clinic review tool, if applicable. No additional management support is needed unless otherwise documented below in the visit note. 

## 2015-12-05 ENCOUNTER — Ambulatory Visit (INDEPENDENT_AMBULATORY_CARE_PROVIDER_SITE_OTHER): Payer: Medicare Other | Admitting: Obstetrics and Gynecology

## 2015-12-05 ENCOUNTER — Encounter: Payer: Self-pay | Admitting: Obstetrics and Gynecology

## 2015-12-05 ENCOUNTER — Encounter: Payer: Self-pay | Admitting: *Deleted

## 2015-12-05 VITALS — BP 129/84 | HR 82 | Resp 18 | Ht 62.0 in | Wt 160.0 lb

## 2015-12-05 DIAGNOSIS — N9489 Other specified conditions associated with female genital organs and menstrual cycle: Secondary | ICD-10-CM | POA: Diagnosis not present

## 2015-12-05 DIAGNOSIS — N9089 Other specified noninflammatory disorders of vulva and perineum: Secondary | ICD-10-CM

## 2015-12-05 NOTE — Progress Notes (Signed)
Pt c/o vaginal knot located on the left side of the labia, denies pain or itching in the area.

## 2015-12-05 NOTE — Progress Notes (Signed)
Patient ID: Kathy Acosta, female   DOB: 1946/10/10, 70 y.o.   MRN: [id number] 70 yo presenting today for the evaluation of a vulva lesions which she noted 1 year ago. She states that it is not painful but the thought of this growth being present bothers her and she desires to have it removed. She is not sure if it has grown in size. She is without any other complaints. She denies abnormal vaginal bleeding or pelvic pain  Past Medical History  Diagnosis Date  . Hypertension   . Arthritis   . Hx of colonic polyps   . Impaired fasting glucose   . Essential tremor     last 2 years   Past Surgical History  Procedure Laterality Date  . Cesarean section      2 times  . Abdominal hysterectomy    . Cervical discectomy  1992    2 times  . Colonoscopy     Family History  Problem Relation Age of Onset  . Hypertension Mother   . Hypertension Father   . Diabetes Father   . Diabetes Brother   . Cancer Paternal Aunt     colon  . Colon cancer Paternal Aunt   . Diabetes Brother   . Heart disease Sister   . Colon cancer Sister    Social History  Substance Use Topics  . Smoking status: Never Smoker   . Smokeless tobacco: Never Used  . Alcohol Use: No     Comment: wine   ROS See pertinent in HPI  Blood pressure 129/84, pulse 82, resp. rate 18, height 5\' 2"  (1.575 m), weight 160 lb (72.576 kg).  GENERAL: Well-developed, well-nourished female in no acute distress.  PELVIC: Normal external female genitalia with a 0.5 cm hard lesion on the left labia majora, not tender to touch EXTREMITIES: No cyanosis, clubbing, or edema, 2+ distal pulses.  A/P 70 yo with vulva lesion Procedure note - After informed consent was obtained and injection of 1 cc 1% lidocaine, a small skin incision was made. A discrete, well circumscribed 0.5 cm black lesion was removed and sent to pathology. The skin was re-approximated with 3.0 Vicryl with good hemostasis. - Patient will be informed of pathology results -  RTC prn

## 2015-12-11 ENCOUNTER — Telehealth: Payer: Self-pay | Admitting: *Deleted

## 2015-12-11 NOTE — Telephone Encounter (Signed)
Informed pt of benign biopsy results.

## 2015-12-11 NOTE — Telephone Encounter (Signed)
Called pt, no answer, unable to leave message.

## 2015-12-11 NOTE — Telephone Encounter (Signed)
-----   Message from Mora Bellman, MD sent at 12/10/2015  8:49 PM EST ----- Please inform patient of benign pathology of the labia. It was indeed a black head  Thanks  Vickii Chafe

## 2016-05-20 ENCOUNTER — Encounter: Payer: Self-pay | Admitting: Internal Medicine

## 2016-05-20 ENCOUNTER — Ambulatory Visit (INDEPENDENT_AMBULATORY_CARE_PROVIDER_SITE_OTHER): Payer: Medicare Other | Admitting: Internal Medicine

## 2016-05-20 VITALS — BP 128/80 | HR 68 | Temp 98.2°F | Ht 62.0 in | Wt 157.5 lb

## 2016-05-20 DIAGNOSIS — M159 Polyosteoarthritis, unspecified: Secondary | ICD-10-CM

## 2016-05-20 DIAGNOSIS — G250 Essential tremor: Secondary | ICD-10-CM | POA: Diagnosis not present

## 2016-05-20 DIAGNOSIS — Z0000 Encounter for general adult medical examination without abnormal findings: Secondary | ICD-10-CM

## 2016-05-20 DIAGNOSIS — I10 Essential (primary) hypertension: Secondary | ICD-10-CM

## 2016-05-20 DIAGNOSIS — Z7189 Other specified counseling: Secondary | ICD-10-CM

## 2016-05-20 NOTE — Assessment & Plan Note (Signed)
I have personally reviewed the Medicare Annual Wellness questionnaire and have noted 1. The patient's medical and social history 2. Their use of alcohol, tobacco or illicit drugs 3. Their current medications and supplements 4. The patient's functional ability including ADL's, fall risks, home safety risks and hearing or visual             impairment. 5. Diet and physical activities 6. Evidence for depression or mood disorders  The patients weight, height, BMI and visual acuity have been recorded in the chart I have made referrals, counseling and provided education to the patient based review of the above and I have provided the pt with a written personalized care plan for preventive services.  I have provided you with a copy of your personalized plan for preventive services. Please take the time to review along with your updated medication list.  Due for screening mammogram Colon due in ~5 years--just done Yearly flu vaccine Discussed fitness

## 2016-05-20 NOTE — Assessment & Plan Note (Signed)
Very mild No Rx needed

## 2016-05-20 NOTE — Assessment & Plan Note (Signed)
Mild No restrictions Doesn't generally take meds

## 2016-05-20 NOTE — Progress Notes (Signed)
Pre visit review using our clinic review tool, if applicable. No additional management support is needed unless otherwise documented below in the visit note. 

## 2016-05-20 NOTE — Progress Notes (Signed)
Subjective:    Patient ID: Kathy Acosta, female    DOB: 1946-03-16, 70 y.o.   MRN: [id number]  HPI  Here for Medicare wellness and follow up of chronic health conditions Reviewed form and advanced directives Reviewed other doctors--no one else regularly (needs new eye doctor) No alcohol or tobacco Does try to exercise regularly No falls No depression or anhedonia Vision is okay Hearing is okay Independent with instrumental ADLs No apparent memory issues of note  Notices aching in joints more-- "as we get older" Had bad neck ache recently--did go away on its own Has had some trouble with energy at times--- will have a down day (1-2 days and then feels back to normal)  No exertional symptoms No chest pain or SOB No dizziness or syncope No palpitations--but may be aware of her heart rarely No sig edema  Still has occasional mild tremor Not as bad as before Mostly in left hands---not progressing and doesn't affect function  Current Outpatient Prescriptions on File Prior to Visit  Medication Sig Dispense Refill  . amLODipine (NORVASC) 10 MG tablet Take 1 tablet by mouth  daily 90 tablet 3  . ascorbic acid (VITAMIN C) 500 MG tablet Take 500 mg by mouth daily.      Marland Kitchen aspirin 81 MG tablet Take 81 mg by mouth daily.      . benazepril (LOTENSIN) 20 MG tablet Take 1 tablet by mouth  daily 90 tablet 3  . calcium-vitamin D (OSCAL WITH D) 500-200 MG-UNIT per tablet Take 1 tablet by mouth daily.      . fish oil-omega-3 fatty acids 1000 MG capsule Take 1 g by mouth daily.      Marland Kitchen glucosamine-chondroitin 500-400 MG tablet Take 2 tablets by mouth daily.      Marland Kitchen losartan-hydrochlorothiazide (HYZAAR) 100-12.5 MG tablet Take 1 tablet by mouth  daily 90 tablet 3  . Multiple Vitamins-Minerals (ULTRA MEGA PO) Take 2 capsules by mouth daily.       No current facility-administered medications on file prior to visit.    Allergies  Allergen Reactions  . Doxazosin     dizziness    Past  Medical History  Diagnosis Date  . Hypertension   . Arthritis   . Hx of colonic polyps   . Impaired fasting glucose   . Essential tremor     last 2 years    Past Surgical History  Procedure Laterality Date  . Cesarean section      2 times  . Abdominal hysterectomy    . Cervical discectomy  1992    2 times  . Colonoscopy      Family History  Problem Relation Age of Onset  . Hypertension Mother   . Hypertension Father   . Diabetes Father   . Diabetes Brother   . Cancer Paternal Aunt     colon  . Colon cancer Paternal Aunt   . Diabetes Brother   . Heart disease Sister   . Colon cancer Sister     Social History   Social History  . Marital Status: Widowed    Spouse Name: N/A  . Number of Children: 2  . Years of Education: N/A   Occupational History  . retired- Games developer. [employer]    Social History Main Topics  . Smoking status: Never Smoker   . Smokeless tobacco: Never Used  . Alcohol Use: 0.0 oz/week    0 Standard drinks or equivalent per week  Comment: wine  . Drug Use: No  . Sexual Activity: No   Other Topics Concern  . Not on file   Social History Narrative   Walks occasionally   No living will   Requests sons to be health care POA   Would like attempts at resuscitation.   Probably wouldn't want tube feeds   Review of Systems Mostly sleeps okay---occasional sleepless nights but rarely Appetite is good Weight is down slightly. Trying to be careful Bowels okay. No blood in stool Wears seat belt Teeth are fine--regular with dentist ([facility]) Voids well. No incontinence No rash or suspicious lesions     Objective:   Physical Exam  Constitutional: She is oriented to person, place, and time. She appears well-developed and well-nourished. No distress.  HENT:  Mouth/Throat: Oropharynx is clear and moist. No oropharyngeal exudate.  Neck: Normal range of motion. Neck supple. No thyromegaly present.  Cardiovascular: Normal rate,  regular rhythm, normal heart sounds and intact distal pulses.  Exam reveals no gallop.   No murmur heard. Pulmonary/Chest: Effort normal and breath sounds normal. No respiratory distress. She has no wheezes. She has no rales.  Abdominal: Soft. There is no tenderness.  Musculoskeletal: She exhibits no edema or tenderness.  Lymphadenopathy:    She has no cervical adenopathy.  Neurological: She is alert and oriented to person, place, and time.  President--- "Daisy Floro, Obama, Bush, Clinton" [phone]- "I need a calculator" D-[id number] Recall 3/3  Mild left hand resting tremor  Skin: No rash noted. No erythema.  Psychiatric: She has a normal mood and affect. Her behavior is normal.          Assessment & Plan:

## 2016-05-20 NOTE — Assessment & Plan Note (Signed)
BP Readings from Last 3 Encounters:  05/20/16 128/80  12/05/15 129/84  11/27/15 120/80   Good control Has done well on ACEI and ARB at same time

## 2016-05-20 NOTE — Addendum Note (Signed)
Addended by: Emelia Salisbury C on: 05/20/2016 11:53 AM   Modules accepted: Miquel Dunn

## 2016-05-20 NOTE — Patient Instructions (Signed)
Please set up your screening mammogram. 

## 2016-05-20 NOTE — Assessment & Plan Note (Signed)
See social history Has the blank forms but hasn't done them--urged her to do this

## 2016-05-30 ENCOUNTER — Encounter: Payer: Medicare Other | Admitting: Internal Medicine

## 2016-06-25 ENCOUNTER — Encounter: Payer: Medicare Other | Admitting: Internal Medicine

## 2016-07-25 ENCOUNTER — Ambulatory Visit (INDEPENDENT_AMBULATORY_CARE_PROVIDER_SITE_OTHER): Payer: Medicare Other

## 2016-07-25 DIAGNOSIS — Z23 Encounter for immunization: Secondary | ICD-10-CM

## 2016-07-28 ENCOUNTER — Other Ambulatory Visit: Payer: Self-pay | Admitting: Internal Medicine

## 2016-07-28 DIAGNOSIS — Z1231 Encounter for screening mammogram for malignant neoplasm of breast: Secondary | ICD-10-CM

## 2016-08-21 ENCOUNTER — Ambulatory Visit
Admission: RE | Admit: 2016-08-21 | Discharge: 2016-08-21 | Disposition: A | Discharge status: Home / Self Care | Payer: Medicare Other | Source: Ambulatory Visit | Attending: Internal Medicine | Admitting: Internal Medicine

## 2016-08-21 DIAGNOSIS — Z1231 Encounter for screening mammogram for malignant neoplasm of breast: Secondary | ICD-10-CM | POA: Diagnosis not present

## 2016-10-31 DIAGNOSIS — M3501 Sicca syndrome with keratoconjunctivitis: Secondary | ICD-10-CM | POA: Diagnosis not present

## 2016-11-07 DIAGNOSIS — H20012 Primary iridocyclitis, left eye: Secondary | ICD-10-CM | POA: Diagnosis not present

## 2016-11-17 ENCOUNTER — Other Ambulatory Visit: Payer: Self-pay

## 2016-11-17 MED ORDER — BENAZEPRIL HCL 20 MG PO TABS: 20.0000 mg | ORAL_TABLET | Freq: Every day | ORAL | 3 refills | Status: DC

## 2016-11-17 MED ORDER — AMLODIPINE BESYLATE 10 MG PO TABS: 10.0000 mg | ORAL_TABLET | Freq: Every day | ORAL | 3 refills | Status: DC

## 2016-11-17 MED ORDER — LOSARTAN POTASSIUM-HCTZ 100-12.5 MG PO TABS: 1.0000 | ORAL_TABLET | Freq: Every day | ORAL | 3 refills | Status: DC

## 2016-11-17 NOTE — Telephone Encounter (Signed)
Rx sent electronically.  

## 2016-11-25 ENCOUNTER — Encounter: Payer: Self-pay | Admitting: Internal Medicine

## 2016-11-25 ENCOUNTER — Ambulatory Visit (INDEPENDENT_AMBULATORY_CARE_PROVIDER_SITE_OTHER): Payer: Medicare Other | Admitting: Internal Medicine

## 2016-11-25 VITALS — BP 132/86 | HR 72 | Temp 98.3°F | Wt 157.0 lb

## 2016-11-25 DIAGNOSIS — H209 Unspecified iridocyclitis: Secondary | ICD-10-CM | POA: Diagnosis not present

## 2016-11-25 DIAGNOSIS — G250 Essential tremor: Secondary | ICD-10-CM | POA: Diagnosis not present

## 2016-11-25 DIAGNOSIS — I10 Essential (primary) hypertension: Secondary | ICD-10-CM

## 2016-11-25 DIAGNOSIS — M159 Polyosteoarthritis, unspecified: Secondary | ICD-10-CM | POA: Diagnosis not present

## 2016-11-25 LAB — COMPREHENSIVE METABOLIC PANEL
ALBUMIN: 4.2 g/dL (ref 3.5–5.2)
ALK PHOS: 73 U/L (ref 39–117)
ALT: 39 U/L — ABNORMAL HIGH (ref 0–35)
AST: 38 U/L — AB (ref 0–37)
BUN: 21 mg/dL (ref 6–23)
CO2: 35 mEq/L — ABNORMAL HIGH (ref 19–32)
Calcium: 10 mg/dL (ref 8.4–10.5)
Chloride: 102 mEq/L (ref 96–112)
Creatinine, Ser: 0.82 mg/dL (ref 0.40–1.20)
GFR: 88.02 mL/min (ref >60.00)
GLUCOSE: 98 mg/dL (ref 70–99)
Potassium: 3.5 mEq/L (ref 3.5–5.1)
SODIUM: 141 meq/L (ref 135–145)
TOTAL PROTEIN: 7.2 g/dL (ref 6.0–8.3)
Total Bilirubin: 0.5 mg/dL (ref 0.2–1.2)

## 2016-11-25 LAB — CBC WITH DIFFERENTIAL/PLATELET
BASOS PCT: 0.6 % (ref 0.0–3.0)
Basophils Absolute: 0 10*3/uL (ref 0.0–0.1)
EOS ABS: 0.1 10*3/uL (ref 0.0–0.7)
Eosinophils Relative: 2.2 % (ref 0.0–5.0)
HCT: 39.8 % (ref 36.0–46.0)
Hemoglobin: 13.1 g/dL (ref 12.0–15.0)
LYMPHS ABS: 1.7 10*3/uL (ref 0.7–4.0)
Lymphocytes Relative: 33.5 % (ref 12.0–46.0)
MCHC: 32.8 g/dL (ref 30.0–36.0)
MCV: 85.5 fl (ref 78.0–100.0)
Monocytes Absolute: 0.4 10*3/uL (ref 0.1–1.0)
Monocytes Relative: 8.7 % (ref 3.0–12.0)
NEUTROS PCT: 55 % (ref 43.0–77.0)
Neutro Abs: 2.8 10*3/uL (ref 1.4–7.7)
Platelets: 253 10*3/uL (ref 150.0–400.0)
RBC: 4.66 Mil/uL (ref 3.87–5.11)
RDW: 14.3 % (ref 11.5–15.5)
WBC: 5.2 10*3/uL (ref 4.0–10.5)

## 2016-11-25 LAB — SEDIMENTATION RATE: SED RATE: 17 mm/h (ref 0–30)

## 2016-11-25 LAB — C-REACTIVE PROTEIN: CRP: 0.1 mg/dL — ABNORMAL LOW (ref 0.5–20.0)

## 2016-11-25 LAB — T4, FREE: FREE T4: 0.98 ng/dL (ref 0.60–1.60)

## 2016-11-25 NOTE — Assessment & Plan Note (Signed)
Mild Discussed fitness and weight training

## 2016-11-25 NOTE — Patient Instructions (Signed)
[facility] for help.

## 2016-11-25 NOTE — Progress Notes (Signed)
Pre visit review using our clinic review tool, if applicable. No additional management support is needed unless otherwise documented below in the visit note. 

## 2016-11-25 NOTE — Assessment & Plan Note (Signed)
Mild No Rx needed No evidence of Parkinson's

## 2016-11-25 NOTE — Assessment & Plan Note (Signed)
BP Readings from Last 3 Encounters:  11/25/16 132/86  05/20/16 128/80  12/05/15 129/84   Reasonable control

## 2016-11-25 NOTE — Assessment & Plan Note (Signed)
Mild No systemic arthritis known---will check HLA B-27 and acute phase reactants

## 2016-11-25 NOTE — Progress Notes (Signed)
Subjective:    Patient ID: Kathy Acosta, female    DOB: 16-Aug-1946, 71 y.o.   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 Heat helps Ongoing minor aches and pains--nothing striking Goes to silver sneakers twice a week or so  No chest pain No SOB No dizziness or syncope No edema No palpitations  Mild tremor persists Variable--but not bad enough for Rx  Current Outpatient Prescriptions on File Prior to Visit  Medication Sig Dispense Refill  . amLODipine (NORVASC) 10 MG tablet Take 1 tablet (10 mg total) by mouth daily. 90 tablet 3  . ascorbic acid (VITAMIN C) 500 MG tablet Take 500 mg by mouth daily.      Marland Kitchen aspirin 81 MG tablet Take 81 mg by mouth daily.      . benazepril (LOTENSIN) 20 MG tablet Take 1 tablet (20 mg total) by mouth daily. 90 tablet 3  . calcium-vitamin D (OSCAL WITH D) 500-200 MG-UNIT per tablet Take 1 tablet by mouth daily.      Marland Kitchen glucosamine-chondroitin 500-400 MG tablet Take 2 tablets by mouth daily.      Marland Kitchen losartan-hydrochlorothiazide (HYZAAR) 100-12.5 MG tablet Take 1 tablet by mouth daily. 90 tablet 3  . Multiple Vitamins-Minerals (ULTRA MEGA PO) Take 2 capsules by mouth daily.       No current facility-administered medications on file prior to visit.     Allergies  Allergen Reactions  . Doxazosin     dizziness    Past Medical History:  Diagnosis Date  . Arthritis   . Essential tremor    last 2 years  . Hx of colonic polyps   . Hypertension   . Impaired fasting glucose     Past Surgical History:  Procedure Laterality Date  . ABDOMINAL HYSTERECTOMY    . CERVICAL DISCECTOMY  1992   2 times  . CESAREAN SECTION     2 times  . COLONOSCOPY       Family History  Problem Relation Age of Onset  . Hypertension Mother   . Hypertension Father   . Diabetes Father   . Diabetes Brother   . Cancer Paternal Aunt     colon  . Colon cancer Paternal Aunt   . Diabetes Brother   . Heart disease Sister   . Colon cancer Sister     Social History   Social History  . Marital status: Widowed    Spouse name: N/A  . Number of children: 2  . Years of education: N/A   Occupational History  . retired- Games developer. [employer]    Social History Main Topics  . Smoking status: Never Smoker  . Smokeless tobacco: Never Used  . Alcohol use 0.0 oz/week     Comment: wine  . Drug use: No  . Sexual activity: No   Other Topics Concern  . Not on file   Social History Narrative   Walks occasionally   No living will   Requests sons to be health care POA   Would like attempts at resuscitation.   Probably wouldn't want tube feeds   Review of Systems Trying to increase fluid intake Sleeps okay usually--some night awakening and trouble reinitiating. Worries about DIL--new diagnosis of ovarian cancer  Appetite is okay Weight stable    Objective:   Physical Exam  Constitutional: She appears well-developed and well-nourished. No distress.  Neck: Normal range of motion. Neck supple. No thyromegaly present.  Cardiovascular: Normal rate, regular rhythm and normal heart sounds.  Exam reveals no gallop.   No murmur heard. Pulmonary/Chest: Effort normal and breath sounds normal. No respiratory distress. She has no wheezes. She has no rales.  Abdominal: Soft. There is no tenderness.  Musculoskeletal: She exhibits no edema.  Lymphadenopathy:    She has no cervical adenopathy.  Neurological:  Very mild resting tremor in hands Normal gait and tone No bradykinesia  Psychiatric: She has a normal mood and affect. Her behavior is normal.          Assessment & Plan:

## 2016-12-11 LAB — HLA-B27 ANTIGEN: DNA Result:: NEGATIVE

## 2017-01-23 ENCOUNTER — Ambulatory Visit (INDEPENDENT_AMBULATORY_CARE_PROVIDER_SITE_OTHER): Payer: Medicare Other | Admitting: Primary Care

## 2017-01-23 ENCOUNTER — Encounter: Payer: Self-pay | Admitting: Primary Care

## 2017-01-23 VITALS — BP 142/92 | HR 78 | Ht 62.0 in | Wt 157.0 lb

## 2017-01-23 DIAGNOSIS — R229 Localized swelling, mass and lump, unspecified: Secondary | ICD-10-CM | POA: Diagnosis not present

## 2017-01-23 NOTE — Progress Notes (Signed)
Subjective:    Patient ID: Kathy Acosta, female    DOB: October 16, 1946, 71 y.o.   MRN: [id number]  HPI  Ms. Creppel is a 71 year old female with a history of generalized osteoarthritis who presents today with a chief complaint of lower extremity pain. Her pain is located to the surrounding tissue of the left medial knee that began about 4 days ago. She first noticed this when elevating her legs in her recliner. She's applied heat and cold pads, tylenol arthritis without much improvement. She originally noticed swelling and shooting pain up to her left groin. Overall she's feeling better as her pain and inflammation have reduced. She denies long travel, injury, falls, weakness, calf pain/swelling.   Review of Systems  Cardiovascular: Negative for leg swelling.  Musculoskeletal: Positive for myalgias. Negative for joint swelling.  Skin: Negative for color change.  Neurological: Negative for weakness and numbness.       Past Medical History:  Diagnosis Date  . Arthritis   . Essential tremor    last 2 years  . Hx of colonic polyps   . Hypertension   . Impaired fasting glucose      Social History   Social History  . Marital status: Widowed    Spouse name: N/A  . Number of children: 2  . Years of education: N/A   Occupational History  . retired- Games developer. [employer]    Social History Main Topics  . Smoking status: Never Smoker  . Smokeless tobacco: Never Used  . Alcohol use 0.0 oz/week     Comment: wine  . Drug use: No  . Sexual activity: No   Other Topics Concern  . Not on file   Social History Narrative   Walks occasionally   No living will   Requests sons to be health care POA   Would like attempts at resuscitation.   Probably wouldn't want tube feeds    Past Surgical History:  Procedure Laterality Date  . ABDOMINAL HYSTERECTOMY    . CERVICAL DISCECTOMY  1992   2 times  . CESAREAN SECTION     2 times  . COLONOSCOPY      Family History  Problem  Relation Age of Onset  . Hypertension Mother   . Hypertension Father   . Diabetes Father   . Diabetes Brother   . Cancer Paternal Aunt     colon  . Colon cancer Paternal Aunt   . Diabetes Brother   . Heart disease Sister   . Colon cancer Sister     Allergies  Allergen Reactions  . Doxazosin     dizziness    Current Outpatient Prescriptions on File Prior to Visit  Medication Sig Dispense Refill  . amLODipine (NORVASC) 10 MG tablet Take 1 tablet (10 mg total) by mouth daily. 90 tablet 3  . ascorbic acid (VITAMIN C) 500 MG tablet Take 500 mg by mouth daily.      Marland Kitchen aspirin 81 MG tablet Take 81 mg by mouth daily.      . benazepril (LOTENSIN) 20 MG tablet Take 1 tablet (20 mg total) by mouth daily. 90 tablet 3  . calcium-vitamin D (OSCAL WITH D) 500-200 MG-UNIT per tablet Take 1 tablet by mouth daily.      Marland Kitchen glucosamine-chondroitin 500-400 MG tablet Take 2 tablets by mouth daily.      Marland Kitchen losartan-hydrochlorothiazide (HYZAAR) 100-12.5 MG tablet Take 1 tablet by mouth daily. 90 tablet 3  . Multiple Vitamins-Minerals (ULTRA  MEGA PO) Take 2 capsules by mouth daily.       No current facility-administered medications on file prior to visit.     BP (!) 142/92   Pulse 78   Ht 5\' 2"  (1.575 m)   Wt 157 lb (71.2 kg)   SpO2 98%   BMI 28.72 kg/m    Objective:   Physical Exam  Constitutional: She appears well-nourished.  Neck: Neck supple.  Cardiovascular: Normal rate.   Pulmonary/Chest: Effort normal.  Musculoskeletal:       Left knee: She exhibits normal range of motion and no swelling.       Legs: Pain, tenderness.  Skin: Skin is warm and dry. No erythema.          Assessment & Plan:  Lower Extremity Pain:  Located to tissue of left medial knee x 4 days, overall better. Exam today not suspicious for DVT or knee involvement. Suspect MSK/tissue involvement, likely nerve irritation causing radiation of pain. Good ROM, no inflammation or erythema. Discussed temporary use  of NSAID's, ice/heat, elevation. Return precautions provided.  Sheral Flow, NP

## 2017-01-23 NOTE — Patient Instructions (Signed)
Your swelling and pain are likely due to a muscle/tissue involvement.  Try Ibuprofen 400-600 mg three times daily.   Apply heat/ice to the area.  Place a pillow between your legs when laying down.  It was a pleasure meeting you!

## 2017-06-02 ENCOUNTER — Ambulatory Visit: Payer: Medicare Other | Admitting: Internal Medicine

## 2017-07-21 ENCOUNTER — Encounter: Payer: Self-pay | Admitting: Internal Medicine

## 2017-07-21 ENCOUNTER — Ambulatory Visit (INDEPENDENT_AMBULATORY_CARE_PROVIDER_SITE_OTHER): Payer: Medicare Other | Admitting: Internal Medicine

## 2017-07-21 VITALS — BP 156/82 | HR 67 | Temp 98.1°F | Ht 61.75 in | Wt 159.2 lb

## 2017-07-21 DIAGNOSIS — Z7189 Other specified counseling: Secondary | ICD-10-CM | POA: Diagnosis not present

## 2017-07-21 DIAGNOSIS — M159 Polyosteoarthritis, unspecified: Secondary | ICD-10-CM

## 2017-07-21 DIAGNOSIS — Z0000 Encounter for general adult medical examination without abnormal findings: Secondary | ICD-10-CM

## 2017-07-21 DIAGNOSIS — G250 Essential tremor: Secondary | ICD-10-CM

## 2017-07-21 DIAGNOSIS — I10 Essential (primary) hypertension: Secondary | ICD-10-CM

## 2017-07-21 DIAGNOSIS — Z23 Encounter for immunization: Secondary | ICD-10-CM

## 2017-07-21 NOTE — Assessment & Plan Note (Signed)
See social history 

## 2017-07-21 NOTE — Assessment & Plan Note (Signed)
I have personally reviewed the Medicare Annual Wellness questionnaire and have noted 1. The patient's medical and social history 2. Their use of alcohol, tobacco or illicit drugs 3. Their current medications and supplements 4. The patient's functional ability including ADL's, fall risks, home safety risks and hearing or visual             impairment. 5. Diet and physical activities 6. Evidence for depression or mood disorders  The patients weight, height, BMI and visual acuity have been recorded in the chart I have made referrals, counseling and provided education to the patient based review of the above and I have provided the pt with a written personalized care plan for preventive services.  I have provided you with a copy of your personalized plan for preventive services. Please take the time to review along with your updated medication list.  Will update pneumovax and flu vaccines Due for mammogram Colon (last one) due 2021 Discussed exercise

## 2017-07-21 NOTE — Assessment & Plan Note (Signed)
Mild Discussed resistance training

## 2017-07-21 NOTE — Addendum Note (Signed)
Addended by: Modena Nunnery on: 07/21/2017 10:20 AM   Modules accepted: Orders

## 2017-07-21 NOTE — Progress Notes (Signed)
Subjective:    Patient ID: Kathy Acosta, female    DOB: 1946-01-14, 71 y.o.   MRN: [id number]  HPI Here for Medicare wellness and follow up of chronic health conditions Reviewed form and advanced directives Reviewed other doctors No alcohol or tobacco Tries to exercise fairly regularly No falls Vision and hearing are okay. 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 Independent with instrumental ADLs Memory is okay  Overall doing well Does notice a locking in left hand--- 3rd finger Goes back 1 month Discussed that it may resolve on its own---or recommended Dr Lorelei Pont for injection  Some upper back pain May be from moving plants before the storm  Stress with DIL diagnosed with ovarian cancer with mets In [place] Has gone up to help with grandkids No regular depression but this is difficult Not anhedonic  She hasn't been checking BP No chest pain or SOB Noticed a fatigue after AM energy (after trip to [place])--- not consistent Gets occasional "kick" in heart No dizziness or syncope  Tremor is the same Comes and goes Mostly left hand but a little in right No voice changes, handwriting problems or stiffness. No gait problems  Current Outpatient Prescriptions on File Prior to Visit  Medication Sig Dispense Refill  . amLODipine (NORVASC) 10 MG tablet Take 1 tablet (10 mg total) by mouth daily. 90 tablet 3  . ascorbic acid (VITAMIN C) 500 MG tablet Take 500 mg by mouth daily.      Marland Kitchen aspirin 81 MG tablet Take 81 mg by mouth daily.      . benazepril (LOTENSIN) 20 MG tablet Take 1 tablet (20 mg total) by mouth daily. 90 tablet 3  . calcium-vitamin D (OSCAL WITH D) 500-200 MG-UNIT per tablet Take 1 tablet by mouth daily.      Marland Kitchen glucosamine-chondroitin 500-400 MG tablet Take 2 tablets by mouth daily.      Marland Kitchen losartan-hydrochlorothiazide (HYZAAR) 100-12.5 MG tablet Take 1 tablet by mouth daily. 90 tablet 3  . Multiple Vitamins-Minerals (ULTRA MEGA PO) Take 2 capsules by mouth daily.        No current facility-administered medications on file prior to visit.     Allergies  Allergen Reactions  . Doxazosin     dizziness    Past Medical History:  Diagnosis Date  . Arthritis   . Essential tremor    last 2 years  . Hx of colonic polyps   . Hypertension   . Impaired fasting glucose     Past Surgical History:  Procedure Laterality Date  . ABDOMINAL HYSTERECTOMY    . CERVICAL DISCECTOMY  1992   2 times  . CESAREAN SECTION     2 times  . COLONOSCOPY      Family History  Problem Relation Age of Onset  . Hypertension Mother   . Hypertension Father   . Diabetes Father   . Diabetes Brother   . Cancer Paternal Aunt        colon  . Colon cancer Paternal Aunt   . Diabetes Brother   . Heart disease Sister   . Colon cancer Sister     Social History   Social History  . Marital status: Widowed    Spouse name: N/A  . Number of children: 2  . Years of education: N/A   Occupational History  . retired- Games developer. [employer]    Social History Main Topics  . Smoking status: Never Smoker  . Smokeless tobacco: Never Used  . Alcohol use  0.0 oz/week     Comment: wine  . Drug use: No  . Sexual activity: No   Other Topics Concern  . Not on file   Social History Narrative   Walks occasionally   No living will   Requests sons to be health care POA   Would like attempts at resuscitation.   Probably wouldn't want tube feeds   Review of Systems Sleep is variable--may be up at night for a while Appetite is good Weight is slightly up Wears seat belt Teeth are okay--regular with dentist Bowels are fine. No blood Voids okay. No incontinence Rare heartburn with tomato sauce--rolaids helps. No dysphagia No skin rash or suspicious lesions No major arthritic pain    Objective:   Physical Exam  Constitutional: She is oriented to person, place, and time. She appears well-nourished. No distress.  HENT:  Mouth/Throat: Oropharynx is clear and moist.  No oropharyngeal exudate.  Neck: No thyromegaly present.  Cardiovascular: Normal rate, regular rhythm, normal heart sounds and intact distal pulses.  Exam reveals no gallop.   No murmur heard. Pulmonary/Chest: Effort normal and breath sounds normal. No respiratory distress. She has no wheezes. She has no rales.  Abdominal: Soft. She exhibits no distension. There is no tenderness. There is no rebound and no guarding.  Musculoskeletal: She exhibits no edema or tenderness.  Lymphadenopathy:    She has no cervical adenopathy.  Neurological: She is alert and oriented to person, place, and time.  President-- "Dwaine Deter, Bush" [phone] [id number] Recall 3/3  Skin: No rash noted. No erythema.  Psychiatric: She has a normal mood and affect. Her behavior is normal.          Assessment & Plan:

## 2017-07-21 NOTE — Assessment & Plan Note (Signed)
Mild No Parkinson's features

## 2017-07-21 NOTE — Assessment & Plan Note (Signed)
BP Readings from Last 3 Encounters:  07/21/17 (!) 156/82  01/23/17 (!) 142/92  11/25/16 132/86   Repeat 156/80 but she is stressed with DIL, etc She will monitor and call me if over 150/90 Continue ACEI and ARB and others as before

## 2017-07-21 NOTE — Patient Instructions (Signed)
Please check your blood pressure once or twice a month. Let me know if it stays over 150/90.

## 2017-07-27 ENCOUNTER — Other Ambulatory Visit: Payer: Self-pay | Admitting: Internal Medicine

## 2017-07-27 DIAGNOSIS — Z1231 Encounter for screening mammogram for malignant neoplasm of breast: Secondary | ICD-10-CM

## 2017-08-25 ENCOUNTER — Ambulatory Visit
Admission: RE | Admit: 2017-08-25 | Discharge: 2017-08-25 | Disposition: A | Discharge status: Home / Self Care | Payer: Medicare Other | Source: Ambulatory Visit | Attending: Internal Medicine | Admitting: Internal Medicine

## 2017-08-25 DIAGNOSIS — Z1231 Encounter for screening mammogram for malignant neoplasm of breast: Secondary | ICD-10-CM | POA: Diagnosis not present

## 2017-09-04 ENCOUNTER — Other Ambulatory Visit: Payer: Self-pay | Admitting: Internal Medicine

## 2017-12-01 DIAGNOSIS — H2513 Age-related nuclear cataract, bilateral: Secondary | ICD-10-CM | POA: Diagnosis not present

## 2018-01-19 ENCOUNTER — Encounter: Payer: Self-pay | Admitting: Internal Medicine

## 2018-01-19 ENCOUNTER — Ambulatory Visit (INDEPENDENT_AMBULATORY_CARE_PROVIDER_SITE_OTHER): Payer: Medicare Other | Admitting: Internal Medicine

## 2018-01-19 VITALS — BP 142/78 | HR 77 | Temp 97.9°F | Wt 163.0 lb

## 2018-01-19 DIAGNOSIS — I10 Essential (primary) hypertension: Secondary | ICD-10-CM | POA: Diagnosis not present

## 2018-01-19 LAB — COMPREHENSIVE METABOLIC PANEL
ALBUMIN: 4 g/dL (ref 3.5–5.2)
ALK PHOS: 61 U/L (ref 39–117)
ALT: 28 U/L (ref 0–35)
AST: 27 U/L (ref 0–37)
BUN: 22 mg/dL (ref 6–23)
CO2: 31 mEq/L (ref 19–32)
CREATININE: 0.73 mg/dL (ref 0.40–1.20)
Calcium: 9.7 mg/dL (ref 8.4–10.5)
Chloride: 101 mEq/L (ref 96–112)
GFR: 100.33 mL/min (ref >60.00)
Glucose, Bld: 106 mg/dL — ABNORMAL HIGH (ref 70–99)
Potassium: 3.3 mEq/L — ABNORMAL LOW (ref 3.5–5.1)
Sodium: 138 mEq/L (ref 135–145)
TOTAL PROTEIN: 7.1 g/dL (ref 6.0–8.3)
Total Bilirubin: 0.4 mg/dL (ref 0.2–1.2)

## 2018-01-19 LAB — CBC
HCT: 38.8 % (ref 36.0–46.0)
Hemoglobin: 12.7 g/dL (ref 12.0–15.0)
MCHC: 32.9 g/dL (ref 30.0–36.0)
MCV: 85.7 fl (ref 78.0–100.0)
PLATELETS: 225 10*3/uL (ref 150.0–400.0)
RBC: 4.52 Mil/uL (ref 3.87–5.11)
RDW: 13.6 % (ref 11.5–15.5)
WBC: 5.3 10*3/uL (ref 4.0–10.5)

## 2018-01-19 NOTE — Progress Notes (Signed)
Subjective:    Patient ID: Kathy Acosta, female    DOB: 1946/08/20, 72 y.o.   MRN: [id number]  HPI Here for follow up of HTN  She is getting some intermittent LLQ pain Not severe but a annoying dull pain Not related to eating or moving bowels Very brief-- like "nudges" Doesn't remember any injuries or lifting issues  Still lots of stress DIL did die from the ovarian cancer She is up there helping with the kids at times  Not checking BP No headaches No chest pain No SOB Has started walking--has noticed some adductor pain in left thigh  Current Outpatient Medications on File Prior to Visit  Medication Sig Dispense Refill  . amLODipine (NORVASC) 10 MG tablet TAKE 1 TABLET BY MOUTH  DAILY 90 tablet 3  . ascorbic acid (VITAMIN C) 500 MG tablet Take 500 mg by mouth daily.      Marland Kitchen aspirin 81 MG tablet Take 81 mg by mouth daily.      . benazepril (LOTENSIN) 20 MG tablet TAKE 1 TABLET BY MOUTH  DAILY 90 tablet 3  . calcium-vitamin D (OSCAL WITH D) 500-200 MG-UNIT per tablet Take 1 tablet by mouth daily.      Marland Kitchen losartan-hydrochlorothiazide (HYZAAR) 100-12.5 MG tablet TAKE 1 TABLET BY MOUTH  DAILY 90 tablet 3  . Multiple Vitamins-Minerals (ULTRA MEGA PO) Take 2 capsules by mouth daily.       No current facility-administered medications on file prior to visit.     Allergies  Allergen Reactions  . Doxazosin     dizziness    Past Medical History:  Diagnosis Date  . Arthritis   . Essential tremor    last 2 years  . Hx of colonic polyps   . Hypertension   . Impaired fasting glucose     Past Surgical History:  Procedure Laterality Date  . ABDOMINAL HYSTERECTOMY    . CERVICAL DISCECTOMY  1992   2 times  . CESAREAN SECTION     2 times  . COLONOSCOPY      Family History  Problem Relation Age of Onset  . Hypertension Mother   . Hypertension Father   . Diabetes Father   . Diabetes Brother   . Cancer Paternal Aunt        colon  . Colon cancer Paternal Aunt   .  Diabetes Brother   . Heart disease Sister   . Colon cancer Sister     Social History   Socioeconomic History  . Marital status: Widowed    Spouse name: Not on file  . Number of children: 2  . Years of education: Not on file  . Highest education level: Not on file  Social Needs  . Financial resource strain: Not on file  . Food insecurity - worry: Not on file  . Food insecurity - inability: Not on file  . Transportation needs - medical: Not on file  . Transportation needs - non-medical: Not on file  Occupational History  . Occupation: retired- Games developer. [employer]  Tobacco Use  . Smoking status: Never Smoker  . Smokeless tobacco: Never Used  Substance and Sexual Activity  . Alcohol use: Yes    Alcohol/week: 0.0 oz    Comment: wine  . Drug use: No  . Sexual activity: No    Birth control/protection: Surgical  Other Topics Concern  . Not on file  Social History Narrative   Walks occasionally   No living will   Requests  sons to be health care POA   Would like attempts at resuscitation.   Probably wouldn't want tube feeds   Review of Systems  No N/V Bowels okay---no blood in stool No edema Appetite is fine--"eating too much" Some sleep problems---not more than 1 night in a row though She is now considering going to the hand surgeon for her thumb    Objective:   Physical Exam  Constitutional: She appears well-developed. No distress.  Neck: No thyromegaly present.  Cardiovascular: Normal rate, regular rhythm and normal heart sounds. Exam reveals no gallop.  No murmur heard. Pulmonary/Chest: Effort normal and breath sounds normal. No respiratory distress. She has no wheezes. She has no rales.  Abdominal: Soft. She exhibits no distension. There is no tenderness. There is no rebound and no guarding.  No apparent hernia (though pain spot in right inguinal area)  Musculoskeletal: She exhibits no edema.  Lymphadenopathy:    She has no cervical adenopathy.    Psychiatric: She has a normal mood and affect. Her behavior is normal.          Assessment & Plan:

## 2018-01-19 NOTE — Assessment & Plan Note (Signed)
BP Readings from Last 3 Encounters:  01/19/18 (!) 142/78  07/21/17 (!) 156/82  01/23/17 (!) 142/92   Fairly good control for her Will not make any changes Check labs

## 2018-01-20 ENCOUNTER — Ambulatory Visit (INDEPENDENT_AMBULATORY_CARE_PROVIDER_SITE_OTHER): Payer: Medicare Other | Admitting: Family Medicine

## 2018-01-20 ENCOUNTER — Encounter: Payer: Self-pay | Admitting: Family Medicine

## 2018-01-20 ENCOUNTER — Other Ambulatory Visit: Payer: Self-pay

## 2018-01-20 VITALS — BP 150/80 | HR 71 | Temp 98.7°F | Ht 61.75 in | Wt 164.5 lb

## 2018-01-20 DIAGNOSIS — M659 Synovitis and tenosynovitis, unspecified: Secondary | ICD-10-CM

## 2018-01-20 DIAGNOSIS — M65332 Trigger finger, left middle finger: Secondary | ICD-10-CM | POA: Diagnosis not present

## 2018-01-20 MED ORDER — METHYLPREDNISOLONE ACETATE 40 MG/ML IJ SUSP
20.0000 mg | Freq: Once | INTRAMUSCULAR | Status: AC
  Administered 2018-01-20: 20 mg via INTRA_ARTICULAR

## 2018-01-20 NOTE — Progress Notes (Signed)
Dr. Frederico Hamman T. Uchenna Seufert, MD, [facility] [place], [address] Phone: [phone] Fax: [phone]  01/20/2018  Patient: Kathy Acosta, MRN: [id number], DOB: August 12, 1946, 72 y.o.  Primary Physician:  Venia Carbon, MD   Chief Complaint  Patient presents with  . Trigger Finger    Left middle finger x 6 months   Subjective:   Kathy Acosta is a 72 y.o. very pleasant female patient who presents with the following:  L middle trigger finger - classic locking. I did a prior 1st digit trigger finger injection on her several years ago with complete resolution of symptoms.   Some volar pain on on the flexor tendon sheath side, too.   Past Medical History, Surgical History, Social History, Family History, Problem List, Medications, and Allergies have been reviewed and updated if relevant.  Patient Active Problem List   Diagnosis Date Noted  . 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 Family history of colon cancer 05/21/2015  . Advance directive discussed with patient 11/21/2014  . Palpitations 05/16/2014  . Essential tremor   . Routine general medical examination at a health care facility 10/07/2011  . IMPAIRED FASTING GLUCOSE 08/25/2008  . Essential hypertension, benign 03/16/2007  . Generalized osteoarthrosis, involving multiple sites 03/16/2007    Past Medical History:  Diagnosis Date  . Arthritis   . Essential tremor    last 2 years  . Hx of colonic polyps   . Hypertension   . Impaired fasting glucose     Past Surgical History:  Procedure Laterality Date  . ABDOMINAL HYSTERECTOMY    . CERVICAL DISCECTOMY  1992   2 times  . CESAREAN SECTION     2 times  . COLONOSCOPY      Social History   Socioeconomic History  . Marital status: Widowed    Spouse name: Not on file  . Number of children: 2  . Years of education: Not on file  . Highest education level: Not on file  Social Needs  . Financial resource strain:  Not on file  . Food insecurity - worry: Not on file  . Food insecurity - inability: Not on file  . Transportation needs - medical: Not on file  . Transportation needs - non-medical: Not on file  Occupational History  . Occupation: retired- Games developer. [employer]  Tobacco Use  . Smoking status: Never Smoker  . Smokeless tobacco: Never Used  Substance and Sexual Activity  . Alcohol use: Yes    Alcohol/week: 0.0 oz    Comment: wine  . Drug use: No  . Sexual activity: No    Birth control/protection: Surgical  Other Topics Concern  . Not on file  Social History Narrative   Walks occasionally   No living will   Requests sons to be health care POA   Would like attempts at resuscitation.   Probably wouldn't want tube feeds    Family History  Problem Relation Age of Onset  . Hypertension Mother   . Hypertension Father   . Diabetes Father   . Diabetes Brother   . Cancer Paternal Aunt        colon  . Colon cancer Paternal Aunt   . Diabetes Brother   . Heart disease Sister   . Colon cancer Sister     Allergies  Allergen Reactions  . Doxazosin     dizziness    Medication list reviewed and updated in full in [facility]  Link.  GEN: No fevers, chills. Nontoxic. Primarily MSK c/o today. MSK: Detailed in the HPI GI: tolerating PO intake without difficulty Neuro: No numbness, parasthesias, or tingling associated. Otherwise the pertinent positives of the ROS are noted above.   Objective:   BP (!) 150/80   Pulse 71   Temp 98.7 F (37.1 C) (Oral)   Ht 5' 1.75" (1.568 m)   Wt 164 lb 8 oz (74.6 kg)   BMI 30.33 kg/m    GEN: WDWN, NAD, Non-toxic, Alert & Oriented x 3 HEENT: Atraumatic, Normocephalic.  Ears and Nose: No external deformity. EXTR: No clubbing/cyanosis/edema NEURO: Normal gait.  PSYCH: Normally interactive. Conversant. Not depressed or anxious appearing.  Calm demeanor.   L hand Ecchymosis or edema: neg ROM wrist/hand/digits: full  Carpals,  MCP's, digits: NT Distal Ulna and Radius: NT Ecchymosis or edema: neg No instability Cysts/nodules: small Digit triggering: classic triggering 3rd Finkelstein's test: neg Snuffbox tenderness: neg Scaphoid tubercle: NT Resisted supination: NT Full composite fist, no malrotation Grip, all digits: 5/5 str DIPJT: NT PIP JT: NT MCP JT: NT Mild flexor tendon sheath tenosynovitis, 3rd Axial load test: neg Phalen's: neg Tinel's: neg Atrophy: neg  Hand sensation: intact   Radiology: No results found.  Assessment and Plan:   Trigger finger, left middle finger - Plan: methylPREDNISolone acetate (DEPO-MEDROL) injection 20 mg  Tenosynovitis of finger  Using an anatomical model, I reviewed with the patent the structures involved and how they related to their diagnosis .  We discussed the pathophysiology of trigger fingers. Discussed the inflammatory nature of nodule creation and likely nodule abutting the A1 pulley system, this causing the patient's discomfort and sensations. We discussed that treatments for this include direct injection into the tendon sheath to attempt to shrink catching tissue. This can be done 1-2 times. Other treatments include surgical release. If the patient fails to trigger finger injections, I would recommend trigger finger release if the patient desires relief of the symptoms.   Trigger Finger Injection, L 3rd Verbal consent was obtained. Risks (including rare risk of infection, potential risk for skin lightening and potential atrophy), benefits and alternatives were discussed. Prepped with Chloraprep and Ethyl Chloride used for anesthesia. Under sterile conditions, patient injected at palmar crease aiming distally with 45 degree angle towards nodule; injected directly into tendon sheath. Medication flowed freely without resistance.  Needle size: 22 gauge 1 1/2 inch Injection: 1/2 cc of Lidocaine 1% and Depo-Medrol 20 mg   Follow-up: No follow-ups on  file.  Meds ordered this encounter  Medications  . methylPREDNISolone acetate (DEPO-MEDROL) injection 20 mg   Signed,  Greyden Besecker T. Daleena Rotter, MD   Allergies as of 01/20/2018      Reactions   Doxazosin    dizziness      Medication List        Accurate as of 01/20/18 11:59 PM. Always use your most recent med list.          amLODipine 10 MG tablet Commonly known as:  NORVASC TAKE 1 TABLET BY MOUTH  DAILY   ascorbic acid 500 MG tablet Commonly known as:  VITAMIN C Take 500 mg by mouth daily.   aspirin 81 MG tablet Take 81 mg by mouth daily.   benazepril 20 MG tablet Commonly known as:  LOTENSIN TAKE 1 TABLET BY MOUTH  DAILY   calcium-vitamin D 500-200 MG-UNIT tablet Commonly known as:  OSCAL WITH D Take 1 tablet by mouth daily.   losartan-hydrochlorothiazide 100-12.5 MG tablet Commonly known  asKonrad Penta TAKE 1 TABLET BY MOUTH  DAILY   ULTRA MEGA PO Take 2 capsules by mouth daily.

## 2018-04-01 ENCOUNTER — Other Ambulatory Visit: Payer: Self-pay

## 2018-04-01 ENCOUNTER — Emergency Department
Admission: EM | Admit: 2018-04-01 | Discharge: 2018-04-01 | Disposition: A | Discharge status: Home / Self Care | Payer: Medicare Other | Attending: Student in an Organized Health Care Education/Training Program | Admitting: Student in an Organized Health Care Education/Training Program

## 2018-04-01 ENCOUNTER — Ambulatory Visit: Payer: Self-pay

## 2018-04-01 ENCOUNTER — Encounter: Payer: Self-pay | Admitting: Emergency Medicine

## 2018-04-01 ENCOUNTER — Emergency Department: Payer: Medicare Other

## 2018-04-01 DIAGNOSIS — R2241 Localized swelling, mass and lump, right lower limb: Secondary | ICD-10-CM | POA: Diagnosis present

## 2018-04-01 DIAGNOSIS — I10 Essential (primary) hypertension: Secondary | ICD-10-CM | POA: Insufficient documentation

## 2018-04-01 DIAGNOSIS — Z79899 Other long term (current) drug therapy: Secondary | ICD-10-CM | POA: Insufficient documentation

## 2018-04-01 DIAGNOSIS — M7121 Synovial cyst of popliteal space [Baker], right knee: Secondary | ICD-10-CM | POA: Diagnosis not present

## 2018-04-01 DIAGNOSIS — R600 Localized edema: Secondary | ICD-10-CM | POA: Diagnosis not present

## 2018-04-01 DIAGNOSIS — Z7982 Long term (current) use of aspirin: Secondary | ICD-10-CM | POA: Insufficient documentation

## 2018-04-01 MED ORDER — TRAMADOL HCL 50 MG PO TABS: 50.0000 mg | ORAL_TABLET | Freq: Four times a day (QID) | ORAL | 0 refills | Status: DC | PRN

## 2018-04-01 MED ORDER — NAPROXEN 375 MG PO TABS: 375.0000 mg | ORAL_TABLET | Freq: Two times a day (BID) | ORAL | 0 refills | Status: AC

## 2018-04-01 MED ORDER — LIDOCAINE 5 % EX PTCH
1.0000 | MEDICATED_PATCH | CUTANEOUS | Status: DC
  Administered 2018-04-01: 1 via TRANSDERMAL

## 2018-04-01 MED ORDER — LIDOCAINE 5 % EX PTCH: 1.0000 | MEDICATED_PATCH | Freq: Two times a day (BID) | CUTANEOUS | 0 refills | Status: DC

## 2018-04-01 MED ORDER — HYDROCODONE-ACETAMINOPHEN 5-325 MG PO TABS
1.0000 | ORAL_TABLET | Freq: Once | ORAL | Status: AC
  Administered 2018-04-01: 1 via ORAL
  Filled 2018-04-01: qty 1

## 2018-04-01 MED ORDER — LIDOCAINE 5 % EX PTCH
MEDICATED_PATCH | CUTANEOUS | Status: AC
  Filled 2018-04-01: qty 1

## 2018-04-01 NOTE — ED Provider Notes (Signed)
[facility] Emergency Department Provider Note    First MD Initiated Contact with Patient 04/01/18 1723     (approximate)  I have reviewed the triage vital signs and the nursing notes.   HISTORY  Chief Complaint Leg Pain and Claudication    HPI Kathy Acosta is a 72 y.o. female presents chief complaint of right leg pain and swelling that started today.  States that she is been having trouble with her right knee for several weeks.  Did recently travel to [place].  States that today when she was walking she had worsening pain and feels that the swelling is more severe.  Denies any numbness or tingling distally.  She is not on any blood thinners.  Denies any trauma to the knee.  No history of blood clots.  Denies any fevers.  States the pain is mild to moderate and worse with movement.  Past Medical History:  Diagnosis Date  . Arthritis   . Essential tremor    last 2 years  . Hx of colonic polyps   . Hypertension   . Impaired fasting glucose    Family History  Problem Relation Age of Onset  . Hypertension Mother   . Hypertension Father   . Diabetes Father   . Diabetes Brother   . Cancer Paternal Aunt        colon  . Colon cancer Paternal Aunt   . Diabetes Brother   . Heart disease Sister   . Colon cancer Sister    Past Surgical History:  Procedure Laterality Date  . ABDOMINAL HYSTERECTOMY    . CERVICAL DISCECTOMY  1992   2 times  . CESAREAN SECTION     2 times  . COLONOSCOPY     Patient Active Problem List   Diagnosis Date Noted  . 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 Family history of colon cancer 05/21/2015  . Advance directive discussed with patient 11/21/2014  . Palpitations 05/16/2014  . Essential tremor   . Routine general medical examination at a health care facility 10/07/2011  . IMPAIRED FASTING GLUCOSE 08/25/2008  . Essential hypertension, benign 03/16/2007  . Generalized osteoarthrosis, involving multiple sites 03/16/2007       Prior to Admission medications   Medication Sig Start Date End Date Taking? Authorizing Provider  amLODipine (NORVASC) 10 MG tablet TAKE 1 TABLET BY MOUTH  DAILY 09/04/17   Viviana Simpler I, MD  ascorbic acid (VITAMIN C) 500 MG tablet Take 500 mg by mouth daily.      [provider]  aspirin 81 MG tablet Take 81 mg by mouth daily.      [provider]  benazepril (LOTENSIN) 20 MG tablet TAKE 1 TABLET BY MOUTH  DAILY 09/04/17   Venia Carbon, MD  calcium-vitamin D (OSCAL WITH D) 500-200 MG-UNIT per tablet Take 1 tablet by mouth daily.      [provider]  losartan-hydrochlorothiazide (HYZAAR) 100-12.5 MG tablet TAKE 1 TABLET BY MOUTH  DAILY 09/04/17   Venia Carbon, MD  Multiple Vitamins-Minerals (ULTRA MEGA PO) Take 2 capsules by mouth daily.      [provider]  naproxen (NAPROSYN) 375 MG tablet Take 1 tablet (375 mg total) by mouth 2 (two) times daily with a meal for 10 days. 04/01/18 04/11/18  Merlyn Lot, MD  traMADol (ULTRAM) 50 MG tablet Take 1 tablet (50 mg total) by mouth every 6 (six) hours as needed. 04/01/18 04/01/19  Merlyn Lot, MD    Allergies  Doxazosin    Social History Social History   Tobacco Use  . Smoking status: Never Smoker  . Smokeless tobacco: Never Used  Substance Use Topics  . Alcohol use: Yes    Alcohol/week: 0.0 oz    Comment: wine  . Drug use: No    Review of Systems Patient denies headaches, rhinorrhea, blurry vision, numbness, shortness of breath, chest pain, edema, cough, abdominal pain, nausea, vomiting, diarrhea, dysuria, fevers, rashes or hallucinations unless otherwise stated above in HPI. ____________________________________________   PHYSICAL EXAM:  VITAL SIGNS: Vitals:   04/01/18 1551  BP: (!) 166/82  Pulse: 87  Resp: 18  Temp: 98.6 F (37 C)  SpO2: 96%    Constitutional: Alert and oriented. Well appearing and in no acute distress. Eyes: Conjunctivae are normal.  Head:  Atraumatic. Nose: No congestion/rhinnorhea. Mouth/Throat: Mucous membranes are moist.   Neck: Painless ROM.  Cardiovascular:   Good peripheral circulation. Respiratory: Normal respiratory effort.  No retractions.  Gastrointestinal: Soft and nontender.  Musculoskeletal: Swelling of the right lower extremity right greater than left with some pitting edema.  Tenderness to palpation of the popliteal fossa.  Compartments posterior and anterior are soft.  No worsening pain with dorsiflexion of the right ankle or with passive range of motion of the knee.  Neurovascularly intact distally.  Brisk cap refill with 2+ PT and DP pulses..  No joint effusions. Neurologic:  Normal speech and language. No gross focal neurologic deficits are appreciated.  Skin:  Skin is warm, dry and intact. No rash noted. Psychiatric: Mood and affect are normal. Speech and behavior are normal.  ____________________________________________   LABS (all labs ordered are listed, but only abnormal results are displayed)  No results found for this or any previous visit (from the past 24 hour(s)). ____________________________________________ ____________________________________________  [id number]  I personally reviewed all radiographic images ordered to evaluate for the above acute complaints and reviewed radiology reports and findings.  These findings were personally discussed with the patient.  Please see medical record for radiology report.  ____________________________________________   PROCEDURES  Procedure(s) performed:  Procedures    Critical Care performed: no ____________________________________________   INITIAL IMPRESSION / ASSESSMENT AND PLAN / ED COURSE  Pertinent labs & imaging results that were available during my care of the patient were reviewed by me and considered in my medical decision making (see chart for details).  DDX: DVT, cellulitis, Baker's cyst, arthritis, edema, lymphedema  Kathy A  Giorgio is a 72 y.o. who presents to the ED with leg pain as described above.  No evidence of DVT.  Pain secondary to probable ruptured Baker's cyst.  This is not clinically consistent with compartment syndrome.  No evidence of infectious process.  Patient will be given pain medication discussed supportive measures and follow-up with orthopedics.  Have discussed with the patient and available family all diagnostics and treatments performed thus far and all questions were answered to the best of my ability. The patient demonstrates understanding and agreement with plan.       ____________________________________________   FINAL CLINICAL IMPRESSION(S) / ED DIAGNOSES  Final diagnoses:  Baker's cyst of knee, right      NEW MEDICATIONS STARTED DURING THIS VISIT:  New Prescriptions   NAPROXEN (NAPROSYN) 375 MG TABLET    Take 1 tablet (375 mg total) by mouth 2 (two) times daily with a meal for 10 days.   TRAMADOL (ULTRAM) 50 MG TABLET    Take 1 tablet (50 mg total) by mouth every 6 (six)  hours as needed.     Note:  This document was prepared using Dragon voice recognition software and may include unintentional dictation errors.     Merlyn Lot, MD 04/01/18 [phone]

## 2018-04-01 NOTE — ED Notes (Signed)
Pt discharged home after verbalizing understanding of discharge instructions; nad noted. 

## 2018-04-01 NOTE — Telephone Encounter (Addendum)
Patient called in with c/o "right leg pain." She says "my right leg started hurting when I walk in the calf and shoots all the way down to my ankle. My right leg is swollen more than my left. The pain is a 5 when walking, but a 0 when I'm lying down." I asked about other symptoms, she says "only swelling." According to protocol, see PCP within 4 hours, no availability with PCP or providers in practice, patient advised to go to the ED or UC, she verbalized understanding and says she will go. Care advice given, patient verbalized understanding.  Reason for Disposition . [1] Thigh or calf pain AND [2] only 1 side AND [3] present > 1 hour  Answer Assessment - Initial Assessment Questions 1. ONSET: "When did the pain start?"      This morning 2. LOCATION: "Where is the pain located?"      Calf of the right leg 3. PAIN: "How bad is the pain?"    (Scale 1-10; or mild, moderate, severe)   -  MILD (1-3): doesn't interfere with normal activities    -  MODERATE (4-7): interferes with normal activities (e.g., work or school) or awakens from sleep, limping    -  SEVERE (8-10): excruciating pain, unable to do any normal activities, unable to walk     5 when walking 4. WORK OR EXERCISE: "Has there been any recent work or exercise that involved this part of the body?"      No 5. CAUSE: "What do you think is causing the leg pain?"     I don't know 6. OTHER SYMPTOMS: "Do you have any other symptoms?" (e.g., chest pain, back pain, breathing difficulty, swelling, rash, fever, numbness, weakness)     Swelling to right leg 7. PREGNANCY: "Is there any chance you are pregnant?" "When was your last menstrual period?"     No  Protocols used: LEG PAIN-A-AH

## 2018-04-01 NOTE — Telephone Encounter (Signed)
Will follow up based on ER disposition

## 2018-04-01 NOTE — ED Notes (Signed)
Pt presents with swollen and painful right calf. Swelling began 2 weeks ago, pain started today. Pt denies sob. NAD Noted.

## 2018-04-01 NOTE — Telephone Encounter (Signed)
Per chart review pt is already at [facility] ED.

## 2018-04-01 NOTE — ED Triage Notes (Signed)
Pt to ED from home c/o right lower leg pain that started today suddenly, right calf warm to touch, some swelling noted to leg.  States plane ride yesterday to [place], denies blood thinners.  Pain worse with walking.  Denies SOB or CP.

## 2018-04-07 ENCOUNTER — Encounter: Payer: Self-pay | Admitting: Family Medicine

## 2018-04-07 ENCOUNTER — Ambulatory Visit (INDEPENDENT_AMBULATORY_CARE_PROVIDER_SITE_OTHER): Payer: Medicare Other | Admitting: Family Medicine

## 2018-04-07 ENCOUNTER — Telehealth: Payer: Self-pay

## 2018-04-07 VITALS — BP 124/70 | HR 76 | Temp 98.5°F | Ht 61.75 in | Wt 161.5 lb

## 2018-04-07 DIAGNOSIS — M7989 Other specified soft tissue disorders: Secondary | ICD-10-CM | POA: Diagnosis not present

## 2018-04-07 DIAGNOSIS — M660 Rupture of popliteal cyst: Secondary | ICD-10-CM

## 2018-04-07 NOTE — Progress Notes (Signed)
Dr. Frederico Hamman T. Fortune Brannigan, MD, [facility] [place], [address] Phone: [phone] Fax: [phone]  04/07/2018  Patient: Kathy Acosta, MRN: [id number], DOB: 06-20-1946, 72 y.o.  Primary Physician:  Venia Carbon, MD   Chief Complaint  Patient presents with  . Follow-up    ER-Baker's Cyst Right Knee   Subjective:   Kathy Acosta is a 72 y.o. very pleasant female patient who presents with the following:  ER f/u R knee pain:    04/01/2018 ER evaluation for leg pain and swelling.  The patient was evaluated for potential DVT, and the ultrasound showed no DVT, but it did show a ruptured Baker's cyst on the right with some dissection into the calf muscle.  Patient has had some continued pain, but it has marginally improved over time.  She has been using some compression with a compression sock as well as some cold along with lidocaine patches and Naprosyn and tramadol.  Knee and leg started swelling in the leg and back of leg.   Ruptured baker's cyst  Past Medical History, Surgical History, Social History, Family History, Problem List, Medications, and Allergies have been reviewed and updated if relevant.  Patient Active Problem List   Diagnosis Date Noted  . 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 Family history of colon cancer 05/21/2015  . Advance directive discussed with patient 11/21/2014  . Palpitations 05/16/2014  . Essential tremor   . Routine general medical examination at a health care facility 10/07/2011  . IMPAIRED FASTING GLUCOSE 08/25/2008  . Essential hypertension, benign 03/16/2007  . Generalized osteoarthrosis, involving multiple sites 03/16/2007    Past Medical History:  Diagnosis Date  . Arthritis   . Essential tremor    last 2 years  . Hx of colonic polyps   . Hypertension   . Impaired fasting glucose     Past Surgical History:  Procedure Laterality Date  . ABDOMINAL HYSTERECTOMY    . CERVICAL  DISCECTOMY  1992   2 times  . CESAREAN SECTION     2 times  . COLONOSCOPY      Social History   Socioeconomic History  . Marital status: Widowed    Spouse name: Not on file  . Number of children: 2  . Years of education: Not on file  . Highest education level: Not on file  Occupational History  . Occupation: retired- Games developer[employer]  Social Needs  . Financial resource strain: Not on file  . Food insecurity:    Worry: Not on file    Inability: Not on file  . Transportation needs:    Medical: Not on file    Non-medical: Not on file  Tobacco Use  . Smoking status: Never Smoker  . Smokeless tobacco: Never Used  Substance and Sexual Activity  . Alcohol use: Yes    Alcohol/week: 0.0 oz    Comment: wine  . Drug use: No  . Sexual activity: Never    Birth control/protection: Surgical  Lifestyle  . Physical activity:    Days per week: Not on file    Minutes per session: Not on file  . Stress: Not on file  Relationships  . Social connections:    Talks on phone: Not on file    Gets together: Not on file    Attends religious service: Not on file    Active member of club or organization: Not on file    Attends meetings of  clubs or organizations: Not on file    Relationship status: Not on file  . Intimate partner violence:    Fear of current or ex partner: Not on file    Emotionally abused: Not on file    Physically abused: Not on file    Forced sexual activity: Not on file  Other Topics Concern  . Not on file  Social History Narrative   Walks occasionally   No living will   Requests sons to be health care POA   Would like attempts at resuscitation.   Probably wouldn't want tube feeds    Family History  Problem Relation Age of Onset  . Hypertension Mother   . Hypertension Father   . Diabetes Father   . Diabetes Brother   . Cancer Paternal Aunt        colon  . Colon cancer Paternal Aunt   . Diabetes Brother   . Heart disease Sister   . Colon  cancer Sister     Allergies  Allergen Reactions  . Doxazosin     dizziness    Medication list reviewed and updated in full in [facility].  GEN: No fevers, chills. Nontoxic. Primarily MSK c/o today. MSK: Detailed in the HPI GI: tolerating PO intake without difficulty Neuro: No numbness, parasthesias, or tingling associated. Otherwise the pertinent positives of the ROS are noted above.   Objective:   BP 124/70   Pulse 76   Temp 98.5 F (36.9 C) (Oral)   Ht 5' 1.75" (1.568 m)   Wt 161 lb 8 oz (73.3 kg)   BMI 29.78 kg/m    GEN: WDWN, NAD, Non-toxic, Alert & Oriented x 3 HEENT: Atraumatic, Normocephalic.  Ears and Nose: No external deformity. EXTR: No clubbing/cyanosis/edema NEURO: Normal gait.  PSYCH: Normally interactive. Conversant. Not depressed or anxious appearing.  Calm demeanor.    Patient does have some enlargement of the right calf with tenderness to squeeze and palpation on the calf itself.  There is some minimal bruising.  Right knee does have tenderness on the medial joint line greater than the lateral joint line.  No significant patellar pain at the facets.  She is able to flex to approximately 100 degrees, but deep flexion causes pain.  She also has some pain with McMurray's maneuver.  All ligamentous structures appear intact.  Radiology: US Venous Img Lower Unilateral Right  Result Date: 04/01/2018 CLINICAL DATA:  Right lower extremity pain and edema. EXAM: RIGHT LOWER EXTREMITY VENOUS DOPPLER ULTRASOUND TECHNIQUE: Gray-scale sonography with graded compression, as well as color Doppler and duplex ultrasound were performed to evaluate the lower extremity deep venous systems from the level of the common femoral vein and including the common femoral, femoral, profunda femoral, popliteal and calf veins including the posterior tibial, peroneal and gastrocnemius veins when visible. The superficial great saphenous vein was also interrogated. Spectral Doppler was  utilized to evaluate flow at rest and with distal augmentation maneuvers in the common femoral, femoral and popliteal veins. COMPARISON:  None. FINDINGS: Contralateral Common Femoral Vein: Respiratory phasicity is normal and symmetric with the symptomatic side. No evidence of thrombus. Normal compressibility. Common Femoral Vein: No evidence of thrombus. Normal compressibility, respiratory phasicity and response to augmentation. Saphenofemoral Junction: No evidence of thrombus. Normal compressibility and flow on color Doppler imaging. Profunda Femoral Vein: No evidence of thrombus. Normal compressibility and flow on color Doppler imaging. Femoral Vein: No evidence of thrombus. Normal compressibility, respiratory phasicity and response to augmentation. Popliteal Vein: No evidence  of thrombus. Normal compressibility, respiratory phasicity and response to augmentation. Calf Veins: No evidence of thrombus. Normal compressibility and flow on color Doppler imaging. Superficial Great Saphenous Vein: No evidence of thrombus. Normal compressibility. Venous Reflux:  None. Other Findings: No evidence of superficial thrombophlebitis. There is an elongated complex region of the popliteal fossa extending into the proximal calf measuring approximately 1.7 cm in thickness, 3.5 cm in transverse width and as much as 11 cm in overall craniocaudad length. This most likely represents a complex Baker's cyst with internal hemorrhage. IMPRESSION: 1. No evidence of right lower extremity deep vein thrombosis. 2. Elongated complex collection in the popliteal fossa and proximal calf likely represents a dissecting complex/hemorrhagic Baker's cyst. Electronically Signed   By: Aletta Edouard M.D.   On: 04/01/2018 17:04    Assessment and Plan:   Ruptured Bakers cyst  Right leg swelling   I would anticipate that this will take at least 4 to 6 weeks to fully recover, slowly and gradually.  I would continue compression, and also recommended  a light neoprene or cloth compression to the knee itself.  Ice at least a couple of times a day.  I think that analgesics such as Tylenol or Aleve is also appropriate.  Reviewed basic knee range of motion, and continue with lidocaine patches.  If needed in the future, could refill her tramadol, but at this point she does not think she needs any.  Follow-up: if symptoms not resolved in 6 weeks  Signed,  Lyza Houseworth T. Ta Fair, MD   Allergies as of 04/07/2018      Reactions   Doxazosin    dizziness      Medication List        Accurate as of 04/07/18 10:47 AM. Always use your most recent med list.          amLODipine 10 MG tablet Commonly known as:  NORVASC TAKE 1 TABLET BY MOUTH  DAILY   ascorbic acid 500 MG tablet Commonly known as:  VITAMIN C Take 500 mg by mouth daily.   aspirin 81 MG tablet Take 81 mg by mouth daily.   benazepril 20 MG tablet Commonly known as:  LOTENSIN TAKE 1 TABLET BY MOUTH  DAILY   calcium-vitamin D 500-200 MG-UNIT tablet Commonly known as:  OSCAL WITH D Take 1 tablet by mouth daily.   lidocaine 5 % Commonly known as:  LIDODERM Place 1 patch onto the skin every 12 (twelve) hours. Remove & Discard patch within 12 hours or as directed by MD   losartan-hydrochlorothiazide 100-12.5 MG tablet Commonly known as:  HYZAAR TAKE 1 TABLET BY MOUTH  DAILY   naproxen 375 MG tablet Commonly known as:  NAPROSYN Take 1 tablet (375 mg total) by mouth 2 (two) times daily with a meal for 10 days.   traMADol 50 MG tablet Commonly known as:  ULTRAM Take 1 tablet (50 mg total) by mouth every 6 (six) hours as needed.   ULTRA MEGA PO Take 2 capsules by mouth daily.

## 2018-04-07 NOTE — Telephone Encounter (Signed)
Tried to call pt to see how she was doing after recent ER visit for knee pain, Dx Baker's Cyst.  CRM created. Please let us know how she is doing if she calls back. Thanks

## 2018-06-11 DIAGNOSIS — R29898 Other symptoms and signs involving the musculoskeletal system: Secondary | ICD-10-CM | POA: Diagnosis not present

## 2018-06-11 DIAGNOSIS — R51 Headache: Secondary | ICD-10-CM | POA: Diagnosis not present

## 2018-06-11 DIAGNOSIS — I10 Essential (primary) hypertension: Secondary | ICD-10-CM | POA: Diagnosis not present

## 2018-07-28 ENCOUNTER — Encounter: Payer: Medicare Other | Admitting: Internal Medicine

## 2018-07-30 ENCOUNTER — Other Ambulatory Visit: Payer: Self-pay | Admitting: Internal Medicine

## 2018-07-30 DIAGNOSIS — Z1231 Encounter for screening mammogram for malignant neoplasm of breast: Secondary | ICD-10-CM

## 2018-08-26 ENCOUNTER — Ambulatory Visit (INDEPENDENT_AMBULATORY_CARE_PROVIDER_SITE_OTHER): Payer: Medicare Other

## 2018-08-26 ENCOUNTER — Encounter

## 2018-08-26 ENCOUNTER — Ambulatory Visit: Payer: Medicare Other

## 2018-08-26 DIAGNOSIS — Z23 Encounter for immunization: Secondary | ICD-10-CM

## 2018-09-02 ENCOUNTER — Ambulatory Visit
Admission: RE | Admit: 2018-09-02 | Discharge: 2018-09-02 | Disposition: A | Discharge status: Home / Self Care | Payer: Medicare Other | Source: Ambulatory Visit | Attending: Internal Medicine | Admitting: Internal Medicine

## 2018-09-02 DIAGNOSIS — Z1231 Encounter for screening mammogram for malignant neoplasm of breast: Secondary | ICD-10-CM | POA: Diagnosis not present

## 2018-09-06 ENCOUNTER — Telehealth: Payer: Self-pay | Admitting: Internal Medicine

## 2018-09-06 ENCOUNTER — Other Ambulatory Visit: Payer: Self-pay | Admitting: Internal Medicine

## 2018-09-06 MED ORDER — VALSARTAN-HYDROCHLOROTHIAZIDE 160-12.5 MG PO TABS: 1.0000 | ORAL_TABLET | Freq: Every day | ORAL | 3 refills | Status: DC

## 2018-09-06 NOTE — Telephone Encounter (Signed)
Tried to call pt but she stepped out. DPR stated not to speak to anyone. I sent in the valsartan/hctz to her mail order.

## 2018-09-06 NOTE — Telephone Encounter (Signed)
Pt's can't get Losartan through mail order anymore and she need another alternative for this medication. Please advise pt what she can take instead.

## 2018-09-06 NOTE — Telephone Encounter (Signed)
I would recommend change to valsartan/HCTZ 160/12.5 if that is available

## 2018-09-07 NOTE — Telephone Encounter (Signed)
Spoke to pt

## 2018-10-19 ENCOUNTER — Ambulatory Visit: Payer: Medicare Other | Admitting: Podiatry

## 2018-10-19 ENCOUNTER — Ambulatory Visit (INDEPENDENT_AMBULATORY_CARE_PROVIDER_SITE_OTHER): Payer: Medicare Other

## 2018-10-19 ENCOUNTER — Other Ambulatory Visit: Payer: Self-pay | Admitting: Podiatry

## 2018-10-19 VITALS — BP 169/83 | HR 75

## 2018-10-19 DIAGNOSIS — M79671 Pain in right foot: Secondary | ICD-10-CM

## 2018-10-19 DIAGNOSIS — M779 Enthesopathy, unspecified: Secondary | ICD-10-CM | POA: Diagnosis not present

## 2018-10-19 DIAGNOSIS — M7751 Other enthesopathy of right foot: Secondary | ICD-10-CM

## 2018-10-19 DIAGNOSIS — M7741 Metatarsalgia, right foot: Secondary | ICD-10-CM

## 2018-10-19 MED ORDER — DICLOFENAC SODIUM 1 % TD GEL
2.0000 g | Freq: Four times a day (QID) | TRANSDERMAL | 2 refills | Status: DC
Start: 2018-10-19 — End: 2020-07-03

## 2018-10-25 NOTE — Progress Notes (Signed)
Subjective:   Patient ID: Kathy Acosta, female   DOB: 72 y.o.   MRN: [id number]   HPI 72 year old female presents the office today for concerns of pain to the right foot, pain submetatarsal 4.  She also gets discomfort when she flexes the toes.  She states that it gets worse with walking.  Is been ongoing about 1 year.  She denies any recent injury or trauma.  No significant numbness or tingling.  She has no other concerns today.   Review of Systems  All other systems reviewed and are negative.  Past Medical History:  Diagnosis Date  . Arthritis   . Essential tremor    last 2 years  . Hx of colonic polyps   . Hypertension   . Impaired fasting glucose     Past Surgical History:  Procedure Laterality Date  . ABDOMINAL HYSTERECTOMY    . CERVICAL DISCECTOMY  1992   2 times  . CESAREAN SECTION     2 times  . COLONOSCOPY       Current Outpatient Medications:  .  amLODipine (NORVASC) 10 MG tablet, TAKE 1 TABLET BY MOUTH  DAILY, Disp: 90 tablet, Rfl: 3 .  ascorbic acid (VITAMIN C) 500 MG tablet, Take 500 mg by mouth daily.  , Disp: , Rfl:  .  aspirin 81 MG tablet, Take 81 mg by mouth daily.  , Disp: , Rfl:  .  benazepril (LOTENSIN) 20 MG tablet, TAKE 1 TABLET BY MOUTH  DAILY, Disp: 90 tablet, Rfl: 3 .  calcium-vitamin D (OSCAL WITH D) 500-200 MG-UNIT per tablet, Take 1 tablet by mouth daily.  , Disp: , Rfl:  .  lidocaine (LIDODERM) 5 %, Place 1 patch onto the skin every 12 (twelve) hours. Remove & Discard patch within 12 hours or as directed by MD, Disp: 10 patch, Rfl: 0 .  Multiple Vitamins-Minerals (ULTRA MEGA PO), Take 2 capsules by mouth daily.  , Disp: , Rfl:  .  traMADol (ULTRAM) 50 MG tablet, Take 1 tablet (50 mg total) by mouth every 6 (six) hours as needed., Disp: 10 tablet, Rfl: 0 .  valsartan-hydrochlorothiazide (DIOVAN HCT) 160-12.5 MG tablet, Take 1 tablet by mouth daily., Disp: 90 tablet, Rfl: 3 .  diclofenac sodium (VOLTAREN) 1 % GEL, Apply 2 g topically 4 (four)  times daily. Rub into affected area of foot 2 to 4 times daily, Disp: 100 g, Rfl: 2  Allergies  Allergen Reactions  . Doxazosin     dizziness          Objective:  Physical Exam  General: AAO x3, NAD  Dermatological: Skin is warm, dry and supple bilateral. Nails x 10 are well manicured; remaining integument appears unremarkable at this time. There are no open sores, no preulcerative lesions, no rash or signs of infection present.  Vascular: Dorsalis Pedis artery and Posterior Tibial artery pedal pulses are 2/4 bilateral with immedate capillary fill time. Pedal hair growth present. No varicosities and no lower extremity edema present bilateral. There is no pain with calf compression, swelling, warmth, erythema.   Neruologic: Grossly intact via light touch bilateral. Protective threshold with Semmes Wienstein monofilament intact to all pedal sites bilateral.   Musculoskeletal: There is mild tenderness palpation on the right foot submetatarsal 1 on fourth MPJ but there is no area pinpoint bony tenderness or pain to vibratory sensation.  Trace edema to the area there is no erythema or warmth.  There is no palpable neuroma identified.  Muscular strength 5/5 in  all groups tested bilateral.  Gait: Unassisted, Nonantalgic.       Assessment:   Capsulitis right foot    Plan:  -Treatment options discussed including all alternatives, risks, and complications -Etiology of symptoms were discussed -X-rays were obtained and reviewed with the patient.  No definitive evidence of acute fracture or stress fracture identified today. -I discussed metatarsal offloading pads.  Prescribed Voltaren gel.  She will consider steroid injection in the future if symptoms persist.  Discussed wearing stiffer soled shoe to take pressure off the MPJs.  Also consider orthotics.  Trula Slade DPM

## 2018-11-09 ENCOUNTER — Ambulatory Visit (INDEPENDENT_AMBULATORY_CARE_PROVIDER_SITE_OTHER): Payer: Medicare Other | Admitting: Family Medicine

## 2018-11-09 ENCOUNTER — Encounter: Payer: Self-pay | Admitting: Family Medicine

## 2018-11-09 VITALS — BP 156/88 | HR 75 | Temp 98.6°F | Ht 61.75 in | Wt 163.8 lb

## 2018-11-09 DIAGNOSIS — I10 Essential (primary) hypertension: Secondary | ICD-10-CM | POA: Diagnosis not present

## 2018-11-09 DIAGNOSIS — L03011 Cellulitis of right finger: Secondary | ICD-10-CM

## 2018-11-09 NOTE — Assessment & Plan Note (Signed)
BP a little high, suspect this is related to pain. FYI to PCP who sees her in 2 weeks for follow-up.

## 2018-11-09 NOTE — Progress Notes (Signed)
   Subjective:     Kathy Acosta is a 73 y.o. female presenting for finger infected (Right middle finger. Started on Christmas eve. It was very swollen, oozing, throbbing sensation was present. A big piece of skin fell out this morning, Finger is getting better. )     HPI   #infected finger - started 10/26/2018 - swollen, oozin, and throbbing - large piece of skin fell off this morning with improvement - yellow and swollen and spreading down the side - has been using neosporin daily - the first week, it was like a hang nail but with throbbing pain    Review of Systems  Constitutional: Negative for chills and fever.  Skin: Positive for rash.     Social History   Tobacco Use  Smoking Status Never Smoker  Smokeless Tobacco Never Used        Objective:    BP Readings from Last 3 Encounters:  11/09/18 (!) 156/88  10/19/18 (!) 169/83  04/07/18 124/70   Wt Readings from Last 3 Encounters:  11/09/18 163 lb 12 oz (74.3 kg)  04/07/18 161 lb 8 oz (73.3 kg)  04/01/18 158 lb (71.7 kg)    BP (!) 156/88   Pulse 75   Temp 98.6 F (37 C)   Ht 5' 1.75" (1.568 m)   Wt 163 lb 12 oz (74.3 kg)   SpO2 97%   BMI 30.19 kg/m    Physical Exam Constitutional:      General: She is not in acute distress.    Appearance: She is well-developed. She is not diaphoretic.  HENT:     Right Ear: External ear normal.     Left Ear: External ear normal.     Nose: Nose normal.  Eyes:     Conjunctiva/sclera: Conjunctivae normal.  Neck:     Musculoskeletal: Neck supple.  Cardiovascular:     Rate and Rhythm: Normal rate.  Pulmonary:     Effort: Pulmonary effort is normal.  Skin:    General: Skin is warm and dry.     Capillary Refill: Capillary refill takes less than 2 seconds.     Comments: Right middle finger with one layer of skin removed with underlying erythema and mild swelling and drainage on the lateral nail.   Neurological:     Mental Status: She is alert. Mental status is  at baseline.  Psychiatric:        Mood and Affect: Mood normal.        Behavior: Behavior normal.           Assessment & Plan:   Problem List Items Addressed This Visit      Cardiovascular and Mediastinum   Essential hypertension, benign    BP a little high, suspect this is related to pain. FYI to PCP who sees her in 2 weeks for follow-up.        Other Visit Diagnoses    Paronychia of right middle finger    -  Primary     Appears to have possibly had an abscess which is now drained. She has been using neosporin with improvement  - Warm water soaks - continue neosporin - if not resolving return for consideration for oral abx  - if abscess pocket returns come back for procedure to drain   Return if symptoms worsen or fail to improve.  Lesleigh Noe, MD

## 2018-11-09 NOTE — Patient Instructions (Signed)
Paronychia  Paronychia is an infection of the skin. It happens near a fingernail or toenail. It may cause pain and swelling around the nail. In some cases, a fluid-filled bump (abscess) can form near or under the nail.  Usually, this condition is not serious, and it clears up with treatment.  Follow these instructions at home:  Wound care   Keep the affected area clean.   Soak the fingers or toes in warm water as told by your doctor. You may be told to do this for 20 minutes, 2-3 times a day.   Keep the area dry when you are not soaking it.   Do not try to drain a fluid-filled bump on your own.   Follow instructions from your doctor about how to take care of the affected area. Make sure you:  ? Wash your hands with soap and water before you change your bandage (dressing). If you cannot use soap and water, use hand sanitizer.  ? Change your bandage as told by your doctor.   If you had a fluid-filled bump and your doctor drained it, check the area every day for signs of infection. Check for:  ? Redness, swelling, or pain.  ? Fluid or blood.  ? Warmth.  ? Pus or a bad smell.  Medicines     Take over-the-counter and prescription medicines only as told by your doctor.   If you were prescribed an antibiotic medicine, take it as told by your doctor. Do not stop taking it even if you start to feel better.  General instructions   Avoid touching any chemicals.   Do not pick at the affected area.  Prevention   To prevent this condition from happening again:  ? Wear rubber gloves when putting your hands in water for washing dishes or other tasks.  ? Wear gloves if your hands might touch cleaners or chemicals.  ? Avoid injuring your nails or fingertips.  ? Do not bite your nails or tear hangnails.  ? Do not cut your nails very short.  ? Do not cut the skin at the base and sides of the nail (cuticles).  ? Use clean nail clippers or scissors when trimming nails.  Contact a doctor if:   You feel worse.   You do not get  better.   You have more fluid, blood, or pus coming from the affected area.   Your finger or knuckle is swollen or is hard to move.  Get help right away if you have:   A fever or chills.   Redness spreading from the affected area.   Pain in a joint or muscle.  Summary   Paronychia is an infection of the skin. It happens near a fingernail or toenail.   This condition may cause pain and swelling around the nail.   Soak the fingers or toes in warm water as told by your doctor.   Usually, this condition is not serious, and it clears up with treatment.  This information is not intended to replace advice given to you by your health care provider. Make sure you discuss any questions you have with your health care provider.  Document Released: 10/08/2009 Document Revised: 11/02/2017 Document Reviewed: 11/02/2017  Elsevier Interactive Patient Education  2019 [employer]

## 2018-11-22 ENCOUNTER — Ambulatory Visit (INDEPENDENT_AMBULATORY_CARE_PROVIDER_SITE_OTHER): Payer: Medicare Other | Admitting: Internal Medicine

## 2018-11-22 ENCOUNTER — Encounter: Payer: Self-pay | Admitting: Internal Medicine

## 2018-11-22 VITALS — BP 130/80 | HR 82 | Temp 97.9°F | Ht 61.5 in | Wt 163.0 lb

## 2018-11-22 DIAGNOSIS — I10 Essential (primary) hypertension: Secondary | ICD-10-CM | POA: Diagnosis not present

## 2018-11-22 DIAGNOSIS — Z0000 Encounter for general adult medical examination without abnormal findings: Secondary | ICD-10-CM

## 2018-11-22 DIAGNOSIS — M159 Polyosteoarthritis, unspecified: Secondary | ICD-10-CM

## 2018-11-22 DIAGNOSIS — R7301 Impaired fasting glucose: Secondary | ICD-10-CM | POA: Diagnosis not present

## 2018-11-22 DIAGNOSIS — Z7189 Other specified counseling: Secondary | ICD-10-CM

## 2018-11-22 DIAGNOSIS — G250 Essential tremor: Secondary | ICD-10-CM | POA: Diagnosis not present

## 2018-11-22 NOTE — Patient Instructions (Signed)
Please ask your pharmacist about the shingrix vaccine (shingles).

## 2018-11-22 NOTE — Assessment & Plan Note (Signed)
Mild No Rx for now

## 2018-11-22 NOTE — Assessment & Plan Note (Signed)
No limiting problems

## 2018-11-22 NOTE — Assessment & Plan Note (Signed)
BP Readings from Last 3 Encounters:  11/22/18 130/80  11/09/18 (!) 156/88  10/19/18 (!) 169/83   Good control Due for labs

## 2018-11-22 NOTE — Progress Notes (Signed)
Subjective:    Patient ID: Kathy Acosta, female    DOB: 07-Jul-1946, 73 y.o.   MRN: [id number]  HPI Here for Medicare wellness visit and follow up of chronic health conditions Reviewed form and advanced directives Reviewed other doctors No alcohol or tobacco No exercise---discussed Vision and hearing are fine No falls No depression or anhedonia Independent with instrumental ADLs No sig memory issues  No problems with the blood pressure medications Has switched to the valsartan without a problem No chest pain or SOB No dizziness or syncope No palpitations No edema----or just a trace if up for a long time  Pain in right foot Saw podiatrist recently---diagnosed tendonitis Baker's cyst did clear from knee---took months to resolve 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 amLODipine (NORVASC) 10 MG tablet TAKE 1 TABLET BY MOUTH  DAILY 90 tablet 3  . ascorbic acid (VITAMIN C) 500 MG tablet Take 500 mg by mouth daily.      . benazepril (LOTENSIN) 20 MG tablet TAKE 1 TABLET BY MOUTH  DAILY 90 tablet 3  . calcium-vitamin D (OSCAL WITH D) 500-200 MG-UNIT per tablet Take 1 tablet by mouth daily.      . diclofenac sodium (VOLTAREN) 1 % GEL Apply 2 g topically 4 (four) times daily. Rub into affected area of foot 2 to 4 times daily 100 g 2  . Multiple Vitamins-Minerals (ULTRA MEGA PO) Take 2 capsules by mouth daily.      . valsartan-hydrochlorothiazide (DIOVAN HCT) 160-12.5 MG tablet Take 1 tablet by mouth daily. 90 tablet 3   No current facility-administered medications on file prior to visit.     Allergies  Allergen Reactions  . Doxazosin     dizziness    Past Medical History:  Diagnosis Date  . Arthritis   . Essential tremor    last 2 years  . Hx of colonic polyps   . Hypertension   . Impaired fasting glucose     Past Surgical History:    Procedure Laterality Date  . ABDOMINAL HYSTERECTOMY    . CERVICAL DISCECTOMY  1992   2 times  . CESAREAN SECTION     2 times  . COLONOSCOPY      Family History  Problem Relation Age of Onset  . Hypertension Mother   . Hypertension Father   . Diabetes Father   . Diabetes Brother   . Cancer Paternal Aunt        colon  . Colon cancer Paternal Aunt   . Diabetes Brother   . Heart disease Sister   . Colon cancer Sister     Social History   Socioeconomic History  . Marital status: Widowed    Spouse name: Not on file  . Number of children: 2  . Years of education: Not on file  . Highest education level: Not on file  Occupational History  . Occupation: retired- Games developer. [employer]  Social Needs  . Financial resource strain: Not on file  . Food insecurity:    Worry: Not on file    Inability: Not on file  . Transportation needs:    Medical: Not on file    Non-medical: Not on file  Tobacco Use  . Smoking status: Never Smoker  . Smokeless tobacco: Never Used  Substance and Sexual Activity  . Alcohol use: Yes    Alcohol/week:  0.0 standard drinks    Comment: wine  . Drug use: No  . Sexual activity: Never    Birth control/protection: Surgical  Lifestyle  . Physical activity:    Days per week: Not on file    Minutes per session: Not on file  . Stress: Not on file  Relationships  . Social connections:    Talks on phone: Not on file    Gets together: Not on file    Attends religious service: Not on file    Active member of club or organization: Not on file    Attends meetings of clubs or organizations: Not on file    Relationship status: Not on file  . Intimate partner violence:    Fear of current or ex partner: Not on file    Emotionally abused: Not on file    Physically abused: Not on file    Forced sexual activity: Not on file  Other Topics Concern  . Not on file  Social History Narrative   Walks occasionally   No living will   Requests sons to  be health care POA   Would like attempts at resuscitation.   Probably wouldn't want tube feeds   Review of Systems Appetite is good Weight is up slightly  Sleeps is variable---up all night occasionally No usual daytime somnolence No skin rash or suspicious lesions. No recent derm visits Teeth okay--keeps up with dentist Wears seat belt Some heartburn with tomato sauce/dry beans---tums help. No dysphagia Bowels are fine. No blood Some urinary urgency---slight leakage at times     Objective:   Physical Exam  Constitutional: She is oriented to person, place, and time. She appears well-developed. No distress.  HENT:  Mouth/Throat: Oropharynx is clear and moist. No oropharyngeal exudate.  Neck: No thyromegaly present.  Cardiovascular: Normal rate, regular rhythm, normal heart sounds and intact distal pulses. Exam reveals no gallop.  No murmur heard. Respiratory: Effort normal and breath sounds normal. No respiratory distress. She has no wheezes. She has no rales.  GI: Soft. There is no abdominal tenderness.  Musculoskeletal:        General: No tenderness or edema.  Lymphadenopathy:    She has no cervical adenopathy.  Neurological: She is alert and oriented to person, place, and time.  President--- "Dwaine Deter, [place]" [phone] [id number] Recall 3/3  Skin: No rash noted. No erythema.  Psychiatric: She has a normal mood and affect. Her behavior is normal.           Assessment & Plan:

## 2018-11-22 NOTE — Assessment & Plan Note (Signed)
I have personally reviewed the Medicare Annual Wellness questionnaire and have noted 1. The patient's medical and social history 2. Their use of alcohol, tobacco or illicit drugs 3. Their current medications and supplements 4. The patient's functional ability including ADL's, fall risks, home safety risks and hearing or visual             impairment. 5. Diet and physical activities 6. Evidence for depression or mood disorders  The patients weight, height, BMI and visual acuity have been recorded in the chart I have made referrals, counseling and provided education to the patient based review of the above and I have provided the pt with a written personalized care plan for preventive services.  I have provided you with a copy of your personalized plan for preventive services. Please take the time to review along with your updated medication list.  Will have one more colonoscopy next year Mammograms every 1-2 years---probably till 66 Yearly flu vaccine Td due at end of this year She will look into shingrix Discussed exercise

## 2018-11-22 NOTE — Assessment & Plan Note (Signed)
Will check labs

## 2018-11-22 NOTE — Progress Notes (Signed)
Hearing Screening   Method: Audiometry   125Hz  250Hz  500Hz  1000Hz  2000Hz  3000Hz  4000Hz  6000Hz  8000Hz   Right ear:   40 25 20  25     Left ear:   25 25 20  25     Vision Screening Comments: January 2019

## 2018-11-22 NOTE — Assessment & Plan Note (Signed)
See social history 

## 2018-11-23 LAB — COMPREHENSIVE METABOLIC PANEL
ALBUMIN: 4.3 g/dL (ref 3.5–5.2)
ALT: 38 U/L — AB (ref 0–35)
AST: 39 U/L — ABNORMAL HIGH (ref 0–37)
Alkaline Phosphatase: 64 U/L (ref 39–117)
BILIRUBIN TOTAL: 0.4 mg/dL (ref 0.2–1.2)
BUN: 16 mg/dL (ref 6–23)
CALCIUM: 10 mg/dL (ref 8.4–10.5)
CO2: 33 mEq/L — ABNORMAL HIGH (ref 19–32)
CREATININE: 0.87 mg/dL (ref 0.40–1.20)
Chloride: 100 mEq/L (ref 96–112)
GFR: 76.92 mL/min (ref >60.00)
Glucose, Bld: 80 mg/dL (ref 70–99)
Potassium: 3.7 mEq/L (ref 3.5–5.1)
Sodium: 140 mEq/L (ref 135–145)
Total Protein: 7.5 g/dL (ref 6.0–8.3)

## 2018-11-23 LAB — CBC
HEMATOCRIT: 39.4 % (ref 36.0–46.0)
Hemoglobin: 13.2 g/dL (ref 12.0–15.0)
MCHC: 33.6 g/dL (ref 30.0–36.0)
MCV: 86.5 fl (ref 78.0–100.0)
PLATELETS: 246 10*3/uL (ref 150.0–400.0)
RBC: 4.55 Mil/uL (ref 3.87–5.11)
RDW: 13.3 % (ref 11.5–15.5)
WBC: 5.2 10*3/uL (ref 4.0–10.5)

## 2018-11-23 LAB — LIPID PANEL
CHOLESTEROL: 170 mg/dL (ref 0–200)
HDL: 46.5 mg/dL (ref >39.00)
NonHDL: 123.26
TRIGLYCERIDES: 260 mg/dL — AB (ref 0.0–149.0)
Total CHOL/HDL Ratio: 4
VLDL: 52 mg/dL — ABNORMAL HIGH (ref 0.0–40.0)

## 2018-11-23 LAB — LDL CHOLESTEROL, DIRECT: Direct LDL: 83 mg/dL

## 2018-11-23 LAB — HEMOGLOBIN A1C: Hgb A1c MFr Bld: 5.8 % (ref 4.6–6.5)

## 2018-11-30 ENCOUNTER — Ambulatory Visit: Payer: Medicare Other | Admitting: Podiatry

## 2018-11-30 DIAGNOSIS — M779 Enthesopathy, unspecified: Secondary | ICD-10-CM

## 2018-11-30 MED ORDER — TRIAMCINOLONE ACETONIDE 10 MG/ML IJ SUSP
10.0000 mg | Freq: Once | INTRAMUSCULAR | Status: AC
  Administered 2018-11-30: 10 mg

## 2018-11-30 NOTE — Progress Notes (Signed)
Subjective: 73 year old female presents the office today for follow-up evaluation of right foot pain.  She says overall she is about the same.  The cushions I gave her before helps some but she only wear this on a slipper because it is too thick.  She points to submetatarsal 4 where she gets her pain with walking.  She denies any swelling and she denies any numbness or tingling.  No recent falls or injury. Denies any systemic complaints such as fevers, chills, nausea, vomiting. No acute changes since last appointment, and no other complaints at this time.   Objective: AAO x3, NAD DP/PT pulses palpable bilaterally, CRT less than 3 seconds There is tenderness along the submetatarsal 4 no evidence of metatarsal 3 area on the right foot.  There is prominence the metatarsal heads.  There is no area pinpoint tenderness on the dorsal aspect the foot there is no edema, erythema. There is no palpable neuroma identified. No open lesions or pre-ulcerative lesions.  No pain with calf compression, swelling, warmth, erythema  Assessment: 73 year old female with capsulitis right foot  Plan: -All treatment options discussed with the patient including all alternatives, risks, complications.  -Today she is requesting steroid injection.  Discussed risks and complications of steroid injection.  She wants to proceed.  The skin was prepped with Betadine and cleaned with alcohol.  I injected a mixture of 1 cc Kenalog 10, 0.5 cc of Marcaine plain, 0.5 cc of 2% lidocaine plain on the third interspace to get the submetatarsal 4 and 3 area.  This was complete without complications.  Postinjection care was discussed.  I dispensed a gel metatarsal pad to help offload the area.  Discussed shoe modifications to help take pressure off the area as well.  I also discussed with her the x-rays and I showed her x-rays from last appointment.  Think that probably recently she is getting pain submetatarsal 4 is given overall parabola of the  metatarsals accommodation with atrophy of the fat pad. -Patient encouraged to call the office with any questions, concerns, change in symptoms.   Trula Slade DPM

## 2019-01-04 ENCOUNTER — Ambulatory Visit: Payer: Medicare Other | Admitting: Podiatry

## 2019-01-04 DIAGNOSIS — M7741 Metatarsalgia, right foot: Secondary | ICD-10-CM

## 2019-01-04 DIAGNOSIS — M779 Enthesopathy, unspecified: Secondary | ICD-10-CM

## 2019-01-04 NOTE — Patient Instructions (Signed)
Look at getting a "superfeet" insert for your shoes.

## 2019-01-10 NOTE — Progress Notes (Signed)
Subjective: 73 year old female presents the office today for follow-up evaluation of right foot pain, capsulitis.  Since the injection she states that her symptoms are much improved.  She still has some intermittent discomfort but much better than what it was.  She had no complications to the injection.  She has no other concerns other than having flatfeet she was discussed treatment options. Denies any systemic complaints such as fevers, chills, nausea, vomiting. No acute changes since last appointment, and no other complaints at this time.   Objective: AAO x3, NAD DP/PT pulses palpable bilaterally, CRT less than 3 seconds There is a decrease in medial arch height upon weightbearing.  At this time there is tenderness palpation submetatarsal 4.  Overall symptoms are much improved.  There is no area pinpoint bony tenderness pain vibratory sensation.  No edema, erythema. No open lesions or pre-ulcerative lesions.  No pain with calf compression, swelling, warmth, erythema  Assessment: Resolving right foot pain, capsulitis  Plan: -All treatment options discussed with the patient including all alternatives, risks, complications.  -Overall her symptoms were improved.  She wishes to hold off on with another steroid injection at this time.  Discussed with her treatment options.  Continue offloading.  Discussed shoe modifications we also discussed various inserts.  She has not started over-the-counter insert. -Patient encouraged to call the office with any questions, concerns, change in symptoms.   Return in about 6 weeks (around 02/15/2019), or if symptoms worsen or fail to improve.  Trula Slade DPM

## 2019-02-15 ENCOUNTER — Ambulatory Visit: Payer: Medicare Other | Admitting: Podiatry

## 2019-04-13 DIAGNOSIS — H2513 Age-related nuclear cataract, bilateral: Secondary | ICD-10-CM | POA: Diagnosis not present

## 2019-06-07 ENCOUNTER — Other Ambulatory Visit: Payer: Self-pay | Admitting: Internal Medicine

## 2019-07-06 ENCOUNTER — Ambulatory Visit (INDEPENDENT_AMBULATORY_CARE_PROVIDER_SITE_OTHER): Payer: Medicare Other

## 2019-07-06 DIAGNOSIS — Z23 Encounter for immunization: Secondary | ICD-10-CM

## 2019-07-27 ENCOUNTER — Telehealth: Payer: Self-pay | Admitting: Internal Medicine

## 2019-07-27 MED ORDER — BENAZEPRIL HCL 20 MG PO TABS: 20.0000 mg | ORAL_TABLET | Freq: Every day | ORAL | 3 refills | Status: DC

## 2019-07-27 NOTE — Telephone Encounter (Signed)
Pt said [facility] has tried to contact us regarding her prescriptions Benezapril and Valsartan. Please call pt when these have been sent to [place].  CB [phone]

## 2019-07-27 NOTE — Telephone Encounter (Signed)
valsartan-hydrochlorothiazide (DIOVAN-HCT) 160-12.5 MG tablet 90 tablet 3 06/07/2019    Sig - Route: TAKE 1 TABLET BY MOUTH DAILY - Oral   Sent to pharmacy as: valsartan-hydrochlorothiazide (DIOVAN-HCT) 160-12.5 MG tablet   Notes to Pharmacy: Requesting 1 year supply   E-Prescribing Status: Receipt confirmed by pharmacy (06/07/2019 8:15 AM EDT)    We have not received a request for benazepril. I will send it to them. The Valsartan was sent to them 06-07-19 as it states above.  I tried to call the pt and there was no answer.

## 2019-07-29 ENCOUNTER — Other Ambulatory Visit: Payer: Self-pay | Admitting: Internal Medicine

## 2019-07-29 DIAGNOSIS — Z1231 Encounter for screening mammogram for malignant neoplasm of breast: Secondary | ICD-10-CM

## 2019-08-01 MED ORDER — BENAZEPRIL HCL 20 MG PO TABS: 20.0000 mg | ORAL_TABLET | Freq: Every day | ORAL | 3 refills | Status: DC

## 2019-08-01 NOTE — Telephone Encounter (Signed)
Patient stated that she is still needing her Valsartan. She said she [facility] and they do not have a prescription for this  Please advise

## 2019-08-01 NOTE — Telephone Encounter (Signed)
Spoke to pt. The valsartan was sent to [facility] 06-07-19 and our system says it was received from the pharmacy at 8:15 am. I mistakenly sent the Benazapril to [facility]. She picked that up but asked that I send a new rx to [facility] for next time. I have done that. She will call [facility] to find out about her valsartan.

## 2019-08-01 NOTE — Addendum Note (Signed)
Addended by: Pilar Grammes on: 08/01/2019 04:58 PM   Modules accepted: Orders

## 2019-08-04 ENCOUNTER — Telehealth: Payer: Self-pay

## 2019-08-04 NOTE — Telephone Encounter (Signed)
[facility] faxed over a note about verifying a rx that was not in [facility] portal. Taken to [facility] and also sent for scanning.

## 2019-08-04 NOTE — Telephone Encounter (Signed)
The fax from [place] does not say what medication is in question.  Called and they were asking if we intended her to take valsartan-hctz along with benazepril. I advised him yes.

## 2019-09-03 ENCOUNTER — Other Ambulatory Visit: Payer: Self-pay | Admitting: Internal Medicine

## 2019-09-15 ENCOUNTER — Other Ambulatory Visit: Payer: Self-pay

## 2019-09-15 ENCOUNTER — Ambulatory Visit
Admission: RE | Admit: 2019-09-15 | Discharge: 2019-09-15 | Disposition: A | Discharge status: Home / Self Care | Payer: Medicare Other | Source: Ambulatory Visit | Attending: Internal Medicine | Admitting: Internal Medicine

## 2019-09-15 DIAGNOSIS — Z1231 Encounter for screening mammogram for malignant neoplasm of breast: Secondary | ICD-10-CM | POA: Diagnosis not present

## 2019-11-29 ENCOUNTER — Ambulatory Visit (INDEPENDENT_AMBULATORY_CARE_PROVIDER_SITE_OTHER): Payer: Medicare Other | Admitting: Internal Medicine

## 2019-11-29 ENCOUNTER — Encounter: Payer: Self-pay | Admitting: Internal Medicine

## 2019-11-29 ENCOUNTER — Other Ambulatory Visit: Payer: Self-pay

## 2019-11-29 VITALS — BP 124/82 | HR 73 | Temp 98.2°F | Ht 62.0 in | Wt 162.1 lb

## 2019-11-29 DIAGNOSIS — I10 Essential (primary) hypertension: Secondary | ICD-10-CM

## 2019-11-29 DIAGNOSIS — G250 Essential tremor: Secondary | ICD-10-CM

## 2019-11-29 DIAGNOSIS — M79652 Pain in left thigh: Secondary | ICD-10-CM | POA: Insufficient documentation

## 2019-11-29 DIAGNOSIS — Z7189 Other specified counseling: Secondary | ICD-10-CM

## 2019-11-29 DIAGNOSIS — Z0000 Encounter for general adult medical examination without abnormal findings: Secondary | ICD-10-CM

## 2019-11-29 LAB — CBC
HCT: 38.9 % (ref 36.0–46.0)
Hemoglobin: 12.8 g/dL (ref 12.0–15.0)
MCHC: 32.9 g/dL (ref 30.0–36.0)
MCV: 86.8 fl (ref 78.0–100.0)
Platelets: 239 10*3/uL (ref 150.0–400.0)
RBC: 4.47 Mil/uL (ref 3.87–5.11)
RDW: 13.7 % (ref 11.5–15.5)
WBC: 5.3 10*3/uL (ref 4.0–10.5)

## 2019-11-29 LAB — COMPREHENSIVE METABOLIC PANEL
ALT: 36 U/L — ABNORMAL HIGH (ref 0–35)
AST: 40 U/L — ABNORMAL HIGH (ref 0–37)
Albumin: 4.2 g/dL (ref 3.5–5.2)
Alkaline Phosphatase: 72 U/L (ref 39–117)
BUN: 16 mg/dL (ref 6–23)
CO2: 32 mEq/L (ref 19–32)
Calcium: 9.7 mg/dL (ref 8.4–10.5)
Chloride: 101 mEq/L (ref 96–112)
Creatinine, Ser: 0.74 mg/dL (ref 0.40–1.20)
GFR: 92.46 mL/min (ref >60.00)
Glucose, Bld: 113 mg/dL — ABNORMAL HIGH (ref 70–99)
Potassium: 3.4 mEq/L — ABNORMAL LOW (ref 3.5–5.1)
Sodium: 139 mEq/L (ref 135–145)
Total Bilirubin: 0.5 mg/dL (ref 0.2–1.2)
Total Protein: 7.4 g/dL (ref 6.0–8.3)

## 2019-11-29 NOTE — Progress Notes (Signed)
Subjective:    Patient ID: Kathy Acosta, female    DOB: 11-07-1945, 74 y.o.   MRN: [id number]  HPI Here for Medicare wellness visit and follow up of chronic health conditions This visit occurred during the SARS-CoV-2 public health emergency.  Safety protocols were in place, including screening questions prior to the visit, additional usage of staff PPE, and extensive cleaning of exam room while observing appropriate contact time as indicated for disinfecting solutions.   Reviewed form and advanced directives Reviewed other doctors Occasional glass of wine No tobacco Walks occasionally Has very dry eyes and can get inflammation--vision is fine Hearing is good No falls No depression or anhedonia Locked in house due to COVID--but did have her first vaccine Independent with instrumental ADLs No apparent memory problems  Will have some left groin pain at times Goes and comes--will affect her walking Goes away quickly though Not below the knee Sore if pressed on  Ongoing tremor Not really worse--but notices it more Still not ready for Rx Mostly in left hand  Remains on BP meds No problems with this No chest pain or SOB No dizziness or syncope No edema  Current Outpatient Medications on File Prior to Visit  Medication Sig Dispense Refill  . amLODipine (NORVASC) 10 MG tablet TAKE 1 TABLET BY MOUTH  DAILY 90 tablet 3  . ascorbic acid (VITAMIN C) 500 MG tablet Take 500 mg by mouth daily.      . benazepril (LOTENSIN) 20 MG tablet Take 1 tablet (20 mg total) by mouth daily. 90 tablet 3  . calcium-vitamin D (OSCAL WITH D) 500-200 MG-UNIT per tablet Take 1 tablet by mouth daily.      . diclofenac sodium (VOLTAREN) 1 % GEL Apply 2 g topically 4 (four) times daily. Rub into affected area of foot 2 to 4 times daily 100 g 2  . Multiple Vitamins-Minerals (ULTRA MEGA PO) Take 2 capsules by mouth daily.      . valsartan-hydrochlorothiazide (DIOVAN-HCT) 160-12.5 MG tablet TAKE 1 TABLET BY  MOUTH  DAILY 90 tablet 3   No current facility-administered medications on file prior to visit.    Allergies  Allergen Reactions  . Doxazosin     dizziness    Past Medical History:  Diagnosis Date  . Arthritis   . Essential tremor    last 2 years  . Hx of colonic polyps   . Hypertension   . Impaired fasting glucose     Past Surgical History:  Procedure Laterality Date  . ABDOMINAL HYSTERECTOMY    . CERVICAL DISCECTOMY  1992   2 times  . CESAREAN SECTION     2 times  . COLONOSCOPY      Family History  Problem Relation Age of Onset  . Hypertension Mother   . Hypertension Father   . Diabetes Father   . Diabetes Brother   . Cancer Paternal Aunt        colon  . Colon cancer Paternal Aunt   . Diabetes Brother   . Heart disease Sister   . Colon cancer Sister     Social History   Socioeconomic History  . Marital status: Widowed    Spouse name: Not on file  . Number of children: 2  . Years of education: Not on file  . Highest education level: Not on file  Occupational History  . Occupation: retired- Games developer. [employer]  Tobacco Use  . Smoking status: Never Smoker  . Smokeless tobacco: Never Used  Substance and Sexual Activity  . Alcohol use: Yes    Alcohol/week: 0.0 standard drinks    Comment: wine  . Drug use: No  . Sexual activity: Never    Birth control/protection: Surgical  Other Topics Concern  . Not on file  Social History Narrative   No living will   Requests sons to be health care POA   Would like attempts at resuscitation.   Probably wouldn't want tube feeds   Social Determinants of Health   Financial Resource Strain:   . Difficulty of Paying Living Expenses: Not on file  Food Insecurity:   . Worried About Charity fundraiser in the Last Year: Not on file  . Ran Out of Food in the Last Year: Not on file  Transportation Needs:   . Lack of Transportation (Medical): Not on file  . Lack of Transportation (Non-Medical): Not on  file  Physical Activity:   . Days of Exercise per Week: Not on file  . Minutes of Exercise per Session: Not on file  Stress:   . Feeling of Stress : Not on file  Social Connections:   . Frequency of Communication with Friends and Family: Not on file  . Frequency of Social Gatherings with Friends and Family: Not on file  . Attends Religious Services: Not on file  . Active Member of Clubs or Organizations: Not on file  . Attends Archivist Meetings: Not on file  . Marital Status: Not on file  Intimate Partner Violence:   . Fear of Current or Ex-Partner: Not on file  . Emotionally Abused: Not on file  . Physically Abused: Not on file  . Sexually Abused: Not on file   Review of Systems Appetite is good Weight up slightly? Sleep is not great---variable. Some bad nights No sig daytime somnolence Wears seat belt Teeth fine--keeps up with dentist No recent dermatology appointments--no suspicious lesions now Bowels are fine--no blood Frequent nocturia at times. Some urgency--but no incontinence No sig back or joint pains    Objective:   Physical Exam  Constitutional: She is oriented to person, place, and time. She appears well-developed. No distress.  HENT:  Mouth/Throat: Oropharynx is clear and moist. No oropharyngeal exudate.  Neck: No thyromegaly present.  Cardiovascular: Normal rate, regular rhythm, normal heart sounds and intact distal pulses. Exam reveals no gallop.  No murmur heard. Respiratory: Effort normal and breath sounds normal. No respiratory distress. She has no wheezes. She has no rales.  GI: Soft. There is no abdominal tenderness.  Musculoskeletal:        General: No tenderness or edema.     Comments: Normal ROM left hip No back tenderness  Lymphadenopathy:    She has no cervical adenopathy.  Neurological: She is alert and oriented to person, place, and time.  President--- "[name], Obama" [id number] Recall 3/3   Skin: No  rash noted. No erythema.  Psychiatric: She has a normal mood and affect. Her behavior is normal.           Assessment & Plan:

## 2019-11-29 NOTE — Assessment & Plan Note (Signed)
BP Readings from Last 3 Encounters:  11/29/19 124/82  11/22/18 130/80  11/09/18 (!) 156/88   Good control Will check labs Has done well on ACEI and ARB for a long time

## 2019-11-29 NOTE — Assessment & Plan Note (Signed)
Some impairment but still not bad enough for medications

## 2019-11-29 NOTE — Assessment & Plan Note (Signed)
Seems muscular Discussed quad strengthening

## 2019-11-29 NOTE — Assessment & Plan Note (Signed)
I have personally reviewed the Medicare Annual Wellness questionnaire and have noted 1. The patient's medical and social history 2. Their use of alcohol, tobacco or illicit drugs 3. Their current medications and supplements 4. The patient's functional ability including ADL's, fall risks, home safety risks and hearing or visual             impairment. 5. Diet and physical activities 6. Evidence for depression or mood disorders  The patients weight, height, BMI and visual acuity have been recorded in the chart I have made referrals, counseling and provided education to the patient based review of the above and I have provided the pt with a written personalized care plan for preventive services.  I have provided you with a copy of your personalized plan for preventive services. Please take the time to review along with your updated medication list.  Had first COVID vaccine Will get second shingrix later this year One last colon due in September Mammogram every 1-2 years till 4 Discussed adding resistance exercise

## 2019-11-29 NOTE — Assessment & Plan Note (Signed)
See social history 

## 2020-03-04 IMAGING — MG DIGITAL SCREENING BILAT W/ TOMO W/ CAD
8 series · 8 of 24 positions shown · non-contrast
Comparison: Previous exam(s).

CLINICAL DATA: Screening.

EXAM:
DIGITAL SCREENING BILATERAL MAMMOGRAM WITH TOMO AND CAD

[R CC synth-2D]
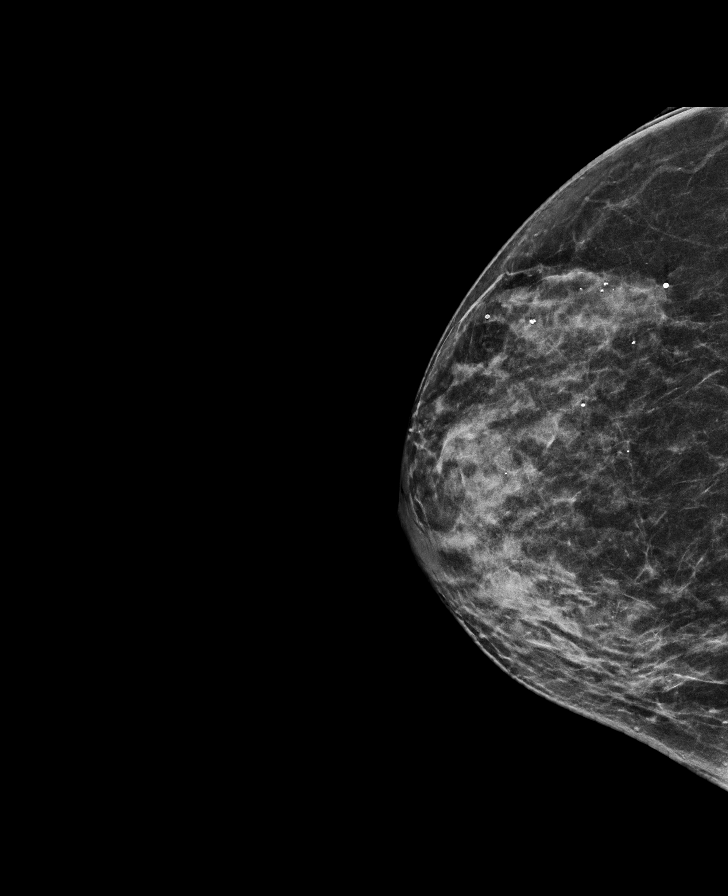

[R MLO synth-2D]
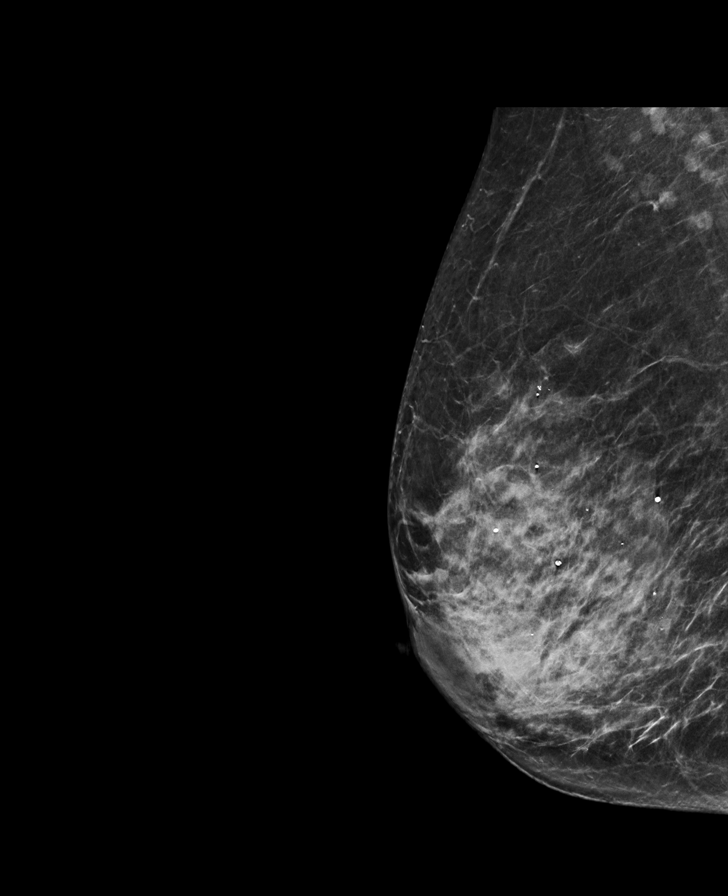

[L CC synth-2D]
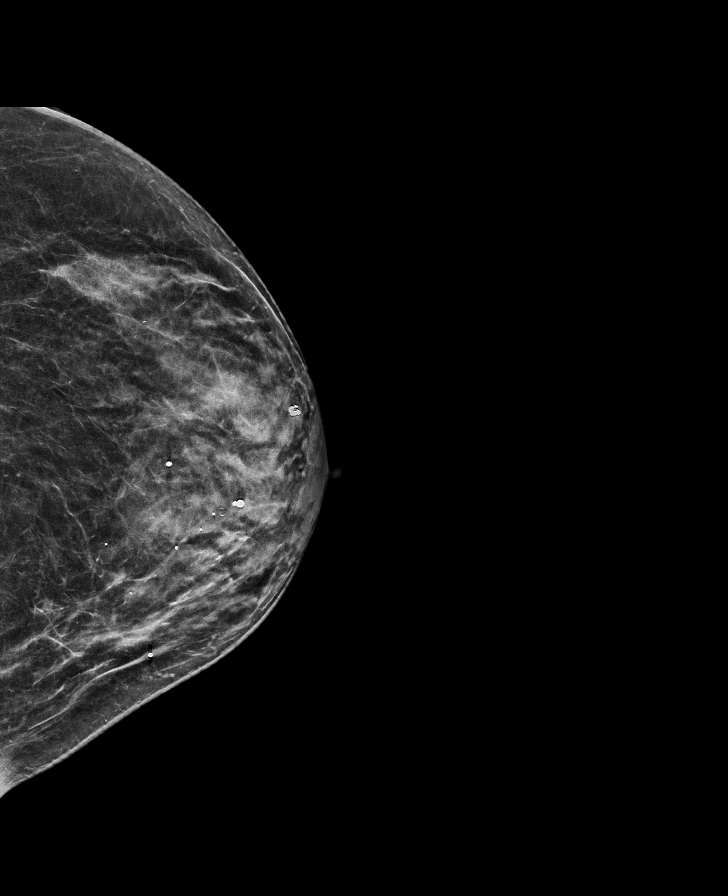

[L MLO synth-2D]
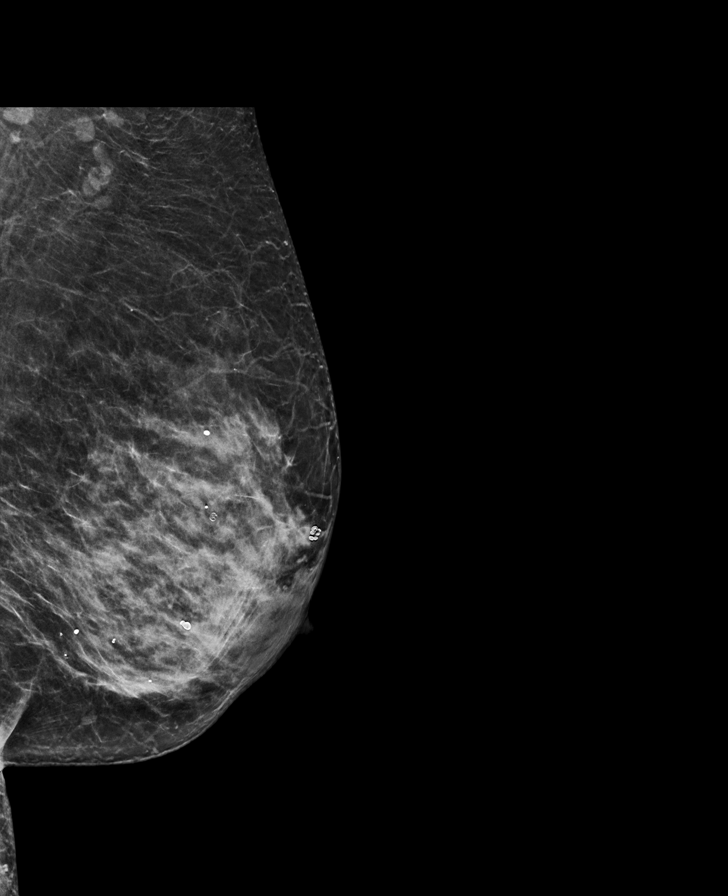

[L CC tomo · tomo slice 33/66.0]
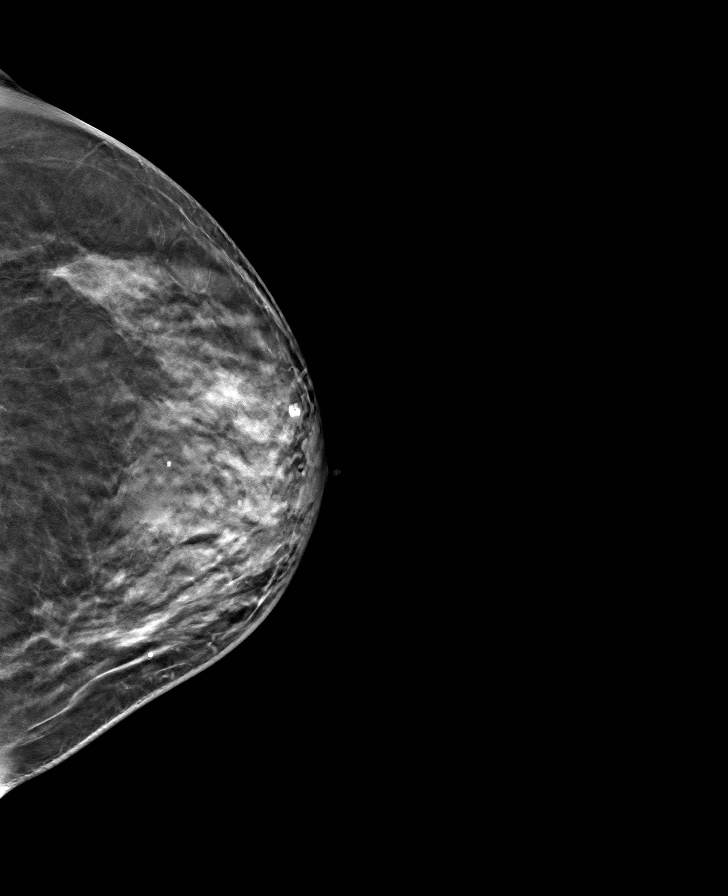

[L MLO tomo · tomo slice 32/63.0]
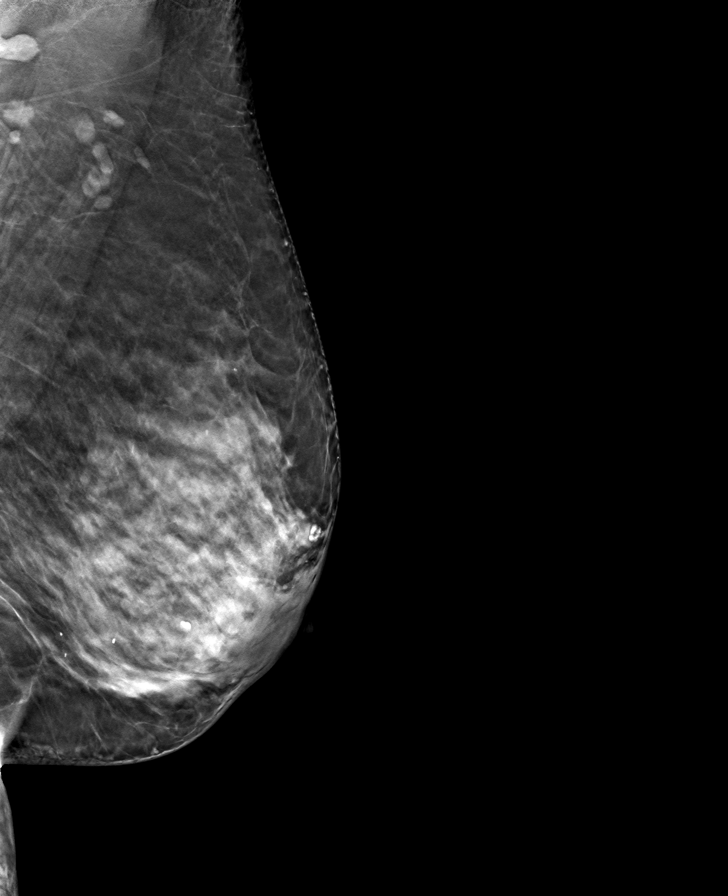

[R CC tomo · tomo slice 36/71.0]
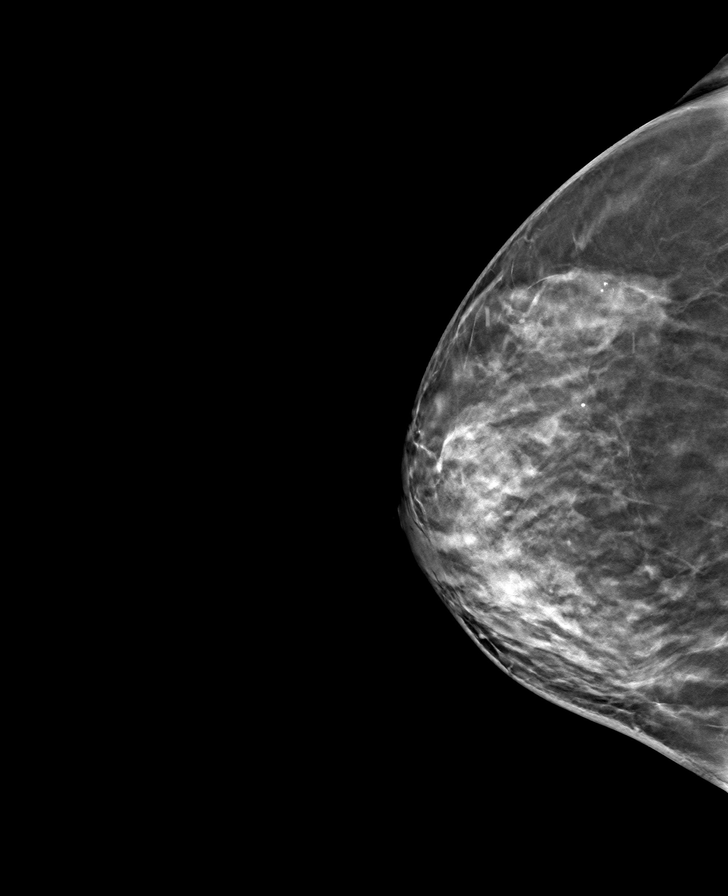

[R MLO tomo · tomo slice 33/66.0]
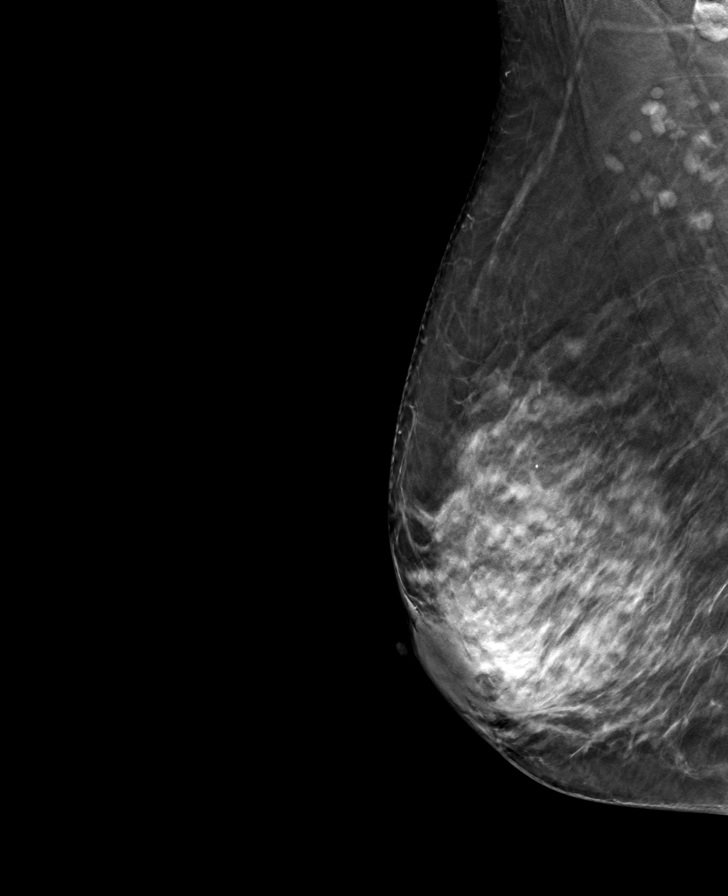

[8 of 24 positions shown; findings below may reference images not displayed]

ACR Breast Density Category c: The breast tissue is heterogeneously
dense, which may obscure small masses.
FINDINGS: There are no findings suspicious for malignancy. Images were
processed with CAD.
IMPRESSION: No mammographic evidence of malignancy. A result letter of this
screening mammogram will be mailed directly to the patient.

RECOMMENDATION:
Screening mammogram in one year. (Code:[id number])

BI-RADS CATEGORY  1: Negative.

## 2020-04-18 DIAGNOSIS — H2513 Age-related nuclear cataract, bilateral: Secondary | ICD-10-CM | POA: Diagnosis not present

## 2020-06-27 DIAGNOSIS — H2511 Age-related nuclear cataract, right eye: Secondary | ICD-10-CM | POA: Diagnosis not present

## 2020-06-27 DIAGNOSIS — M1991 Primary osteoarthritis, unspecified site: Secondary | ICD-10-CM | POA: Diagnosis not present

## 2020-07-09 ENCOUNTER — Other Ambulatory Visit: Payer: Self-pay | Admitting: Internal Medicine

## 2020-07-10 ENCOUNTER — Encounter: Payer: Self-pay | Admitting: Ophthalmology

## 2020-07-10 ENCOUNTER — Other Ambulatory Visit
Admission: RE | Admit: 2020-07-10 | Discharge: 2020-07-10 | Disposition: A | Discharge status: Home / Self Care | Payer: Medicare Other | Source: Ambulatory Visit | Attending: Ophthalmology | Admitting: Ophthalmology

## 2020-07-10 ENCOUNTER — Other Ambulatory Visit: Payer: Self-pay

## 2020-07-10 DIAGNOSIS — Z01812 Encounter for preprocedural laboratory examination: Secondary | ICD-10-CM | POA: Diagnosis not present

## 2020-07-10 DIAGNOSIS — Z20822 Contact with and (suspected) exposure to covid-19: Secondary | ICD-10-CM | POA: Insufficient documentation

## 2020-07-10 NOTE — Discharge Instructions (Signed)

## 2020-07-11 LAB — SARS CORONAVIRUS 2 (TAT 6-24 HRS): SARS Coronavirus 2: NEGATIVE

## 2020-07-12 ENCOUNTER — Encounter
Admission: RE | Disposition: A | Discharge status: Home / Self Care | Payer: Self-pay | Source: Home / Self Care | Attending: Ophthalmology

## 2020-07-12 ENCOUNTER — Other Ambulatory Visit: Payer: Self-pay

## 2020-07-12 ENCOUNTER — Encounter: Payer: Self-pay | Admitting: Ophthalmology

## 2020-07-12 ENCOUNTER — Ambulatory Visit: Payer: Medicare Other | Admitting: Anesthesiology

## 2020-07-12 ENCOUNTER — Ambulatory Visit
Admission: RE | Admit: 2020-07-12 | Discharge: 2020-07-12 | Disposition: A | Discharge status: Home / Self Care | Payer: Medicare Other | Attending: Ophthalmology | Admitting: Ophthalmology

## 2020-07-12 DIAGNOSIS — I10 Essential (primary) hypertension: Secondary | ICD-10-CM | POA: Insufficient documentation

## 2020-07-12 DIAGNOSIS — M1990 Unspecified osteoarthritis, unspecified site: Secondary | ICD-10-CM | POA: Diagnosis not present

## 2020-07-12 DIAGNOSIS — H2511 Age-related nuclear cataract, right eye: Secondary | ICD-10-CM | POA: Diagnosis not present

## 2020-07-12 DIAGNOSIS — H25811 Combined forms of age-related cataract, right eye: Secondary | ICD-10-CM | POA: Diagnosis not present

## 2020-07-12 DIAGNOSIS — Z79899 Other long term (current) drug therapy: Secondary | ICD-10-CM | POA: Diagnosis not present

## 2020-07-12 HISTORY — PX: CATARACT EXTRACTION W/PHACO: SHX586

## 2020-07-12 SURGERY — PHACOEMULSIFICATION, CATARACT, WITH IOL INSERTION
Anesthesia: Monitor Anesthesia Care | Site: Eye | Laterality: Right

## 2020-07-12 MED ORDER — ARMC OPHTHALMIC DILATING DROPS
1.0000 "application " | OPHTHALMIC | Status: DC | PRN
  Administered 2020-07-12 (×3): 1 via OPHTHALMIC

## 2020-07-12 MED ORDER — MOXIFLOXACIN HCL 0.5 % OP SOLN
OPHTHALMIC | Status: DC | PRN
  Administered 2020-07-12: 0.2 mL via OPHTHALMIC

## 2020-07-12 MED ORDER — LACTATED RINGERS IV SOLN: INTRAVENOUS | Status: DC

## 2020-07-12 MED ORDER — EPINEPHRINE PF 1 MG/ML IJ SOLN
INTRAOCULAR | Status: DC | PRN
  Administered 2020-07-12: 61 mL via OPHTHALMIC

## 2020-07-12 MED ORDER — NA CHONDROIT SULF-NA HYALURON 40-17 MG/ML IO SOLN
INTRAOCULAR | Status: DC | PRN
  Administered 2020-07-12: 1 mL via INTRAOCULAR

## 2020-07-12 MED ORDER — BRIMONIDINE TARTRATE-TIMOLOL 0.2-0.5 % OP SOLN
OPHTHALMIC | Status: DC | PRN
  Administered 2020-07-12: 1 [drp] via OPHTHALMIC

## 2020-07-12 MED ORDER — MIDAZOLAM HCL 2 MG/2ML IJ SOLN
INTRAMUSCULAR | Status: DC | PRN
  Administered 2020-07-12: 2 mg via INTRAVENOUS

## 2020-07-12 MED ORDER — TETRACAINE HCL 0.5 % OP SOLN
1.0000 [drp] | OPHTHALMIC | Status: DC | PRN
  Administered 2020-07-12 (×3): 1 [drp] via OPHTHALMIC

## 2020-07-12 MED ORDER — FENTANYL CITRATE (PF) 100 MCG/2ML IJ SOLN
INTRAMUSCULAR | Status: DC | PRN
Start: 2020-07-12 — End: 2020-07-12
  Administered 2020-07-12: 50 ug via INTRAVENOUS

## 2020-07-12 MED ORDER — LIDOCAINE HCL (PF) 2 % IJ SOLN
INTRAOCULAR | Status: DC | PRN
  Administered 2020-07-12: 1 mL

## 2020-07-12 SURGICAL SUPPLY — 19 items
CANNULA ANT/CHMB 27GA (MISCELLANEOUS) ×6 IMPLANT
GLOVE SURG LX 8.0 MICRO (GLOVE) ×4
GLOVE SURG LX STRL 8.0 MICRO (GLOVE) ×2 IMPLANT
GLOVE SURG TRIUMPH 8.0 PF LTX (GLOVE) ×3 IMPLANT
GOWN STRL REUS W/ TWL LRG LVL3 (GOWN DISPOSABLE) ×2 IMPLANT
GOWN STRL REUS W/TWL LRG LVL3 (GOWN DISPOSABLE) ×6
LENS IOL TECNIS EYHANCE 22.5 ×3 IMPLANT
MARKER SKIN DUAL TIP RULER LAB (MISCELLANEOUS) ×3 IMPLANT
NEEDLE FILTER BLUNT 18X 1/2SAF (NEEDLE) ×2
NEEDLE FILTER BLUNT 18X1 1/2 (NEEDLE) ×1 IMPLANT
PACK EYE AFTER SURG (MISCELLANEOUS) ×3 IMPLANT
PACK OPTHALMIC (MISCELLANEOUS) ×3 IMPLANT
PACK PORFILIO (MISCELLANEOUS) ×3 IMPLANT
SUT ETHILON 10-0 CS-B-6CS-B-6 (SUTURE)
SUTURE EHLN 10-0 CS-B-6CS-B-6 (SUTURE) IMPLANT
SYR 3ML LL SCALE MARK (SYRINGE) ×3 IMPLANT
SYR TB 1ML LUER SLIP (SYRINGE) ×3 IMPLANT
WATER STERILE IRR 250ML POUR (IV SOLUTION) ×3 IMPLANT
WIPE NON LINTING 3.25X3.25 (MISCELLANEOUS) ×3 IMPLANT

## 2020-07-12 NOTE — Anesthesia Procedure Notes (Signed)
Procedure Name: MAC Performed by: Palin Tristan, CRNA Pre-anesthesia Checklist: Patient identified, Emergency Drugs available, Suction available, Timeout performed and Patient being monitored Patient Re-evaluated:Patient Re-evaluated prior to induction Oxygen Delivery Method: Nasal cannula Placement Confirmation: positive ETCO2       

## 2020-07-12 NOTE — Transfer of Care (Signed)
Immediate Anesthesia Transfer of Care Note  Patient: Kathy Acosta  Procedure(s) Performed: CATARACT EXTRACTION PHACO AND INTRAOCULAR LENS PLACEMENT (IOC) RIGHT (Right Eye)  Patient Location: PACU  Anesthesia Type: MAC  Level of Consciousness: awake, alert  and patient cooperative  Airway and Oxygen Therapy: Patient Spontanous Breathing and Patient connected to supplemental oxygen  Post-op Assessment: Post-op Vital signs reviewed, Patient's Cardiovascular Status Stable, Respiratory Function Stable, Patent Airway and No signs of Nausea or vomiting  Post-op Vital Signs: Reviewed and stable  Complications: No complications documented.

## 2020-07-12 NOTE — Anesthesia Preprocedure Evaluation (Signed)
Anesthesia Evaluation  Patient identified by MRN, date of birth, ID band Patient awake    Reviewed: Allergy & Precautions, H&P , NPO status , Patient's Chart, lab work & pertinent test results  Airway Mallampati: I  TM Distance: >3 FB Neck ROM: full    Dental no notable dental hx.    Pulmonary neg pulmonary ROS,    Pulmonary exam normal        Cardiovascular hypertension, On Medications Normal cardiovascular exam Rhythm:regular Rate:Normal     Neuro/Psych negative neurological ROS     GI/Hepatic negative GI ROS, Neg liver ROS,   Endo/Other  negative endocrine ROS  Renal/GU negative Renal ROS  negative genitourinary   Musculoskeletal   Abdominal   Peds  Hematology negative hematology ROS (+)   Anesthesia Other Findings   Reproductive/Obstetrics negative OB ROS                             Anesthesia Physical Anesthesia Plan  ASA: II  Anesthesia Plan: MAC   Post-op Pain Management:    Induction:   PONV Risk Score and Plan: 2 and Treatment may vary due to age or medical condition  Airway Management Planned:   Additional Equipment:   Intra-op Plan:   Post-operative Plan:   Informed Consent: I have reviewed the patients History and Physical, chart, labs and discussed the procedure including the risks, benefits and alternatives for the proposed anesthesia with the patient or authorized representative who has indicated his/her understanding and acceptance.       Plan Discussed with:   Anesthesia Plan Comments:         Anesthesia Quick Evaluation

## 2020-07-12 NOTE — Op Note (Signed)
PREOPERATIVE DIAGNOSIS:  Nuclear sclerotic cataract of the right eye.   POSTOPERATIVE DIAGNOSIS:  Cataract   OPERATIVE PROCEDURE:@   SURGEON:  Birder Robson, MD.   ANESTHESIA:  Anesthesiologist: Elgie Collard, MD CRNA: Mayme Genta, CRNA  1.      Managed anesthesia care. 2.      0.22ml of Shugarcaine was instilled in the eye following the paracentesis.   COMPLICATIONS:  None.   TECHNIQUE:   Stop and chop   DESCRIPTION OF PROCEDURE:  The patient was examined and consented in the preoperative holding area where the aforementioned topical anesthesia was applied to the right eye and then brought back to the Operating Room where the right eye was prepped and draped in the usual sterile ophthalmic fashion and a lid speculum was placed. A paracentesis was created with the side port blade and the anterior chamber was filled with viscoelastic. A near clear corneal incision was performed with the steel keratome. A continuous curvilinear capsulorrhexis was performed with a cystotome followed by the capsulorrhexis forceps. Hydrodissection and hydrodelineation were carried out with BSS on a blunt cannula. The lens was removed in a stop and chop  technique and the remaining cortical material was removed with the irrigation-aspiration handpiece. The capsular bag was inflated with viscoelastic and the Technis ZCB00  lens was placed in the capsular bag without complication. The remaining viscoelastic was removed from the eye with the irrigation-aspiration handpiece. The wounds were hydrated. The anterior chamber was flushed with BSS and the eye was inflated to physiologic pressure. 0.29ml of Vigamox was placed in the anterior chamber. The wounds were found to be water tight. The eye was dressed with Combigan. The patient was given protective glasses to wear throughout the day and a shield with which to sleep tonight. The patient was also given drops with which to begin a drop regimen today and will follow-up  with me in one day. Implant Name Type Inv. Item Serial No. Manufacturer Lot No. LRB No. Used Action  TECNIS SIMPLICITY EYHANCE IOL Intraocular Lens  [id number] Wynetta Emery [employer]  Right 1 Implanted   Procedure(s) with comments: CATARACT EXTRACTION PHACO AND INTRAOCULAR LENS PLACEMENT (IOC) RIGHT (Right) - 7.35 0:46.4  Electronically signed: Birder Robson 07/12/2020 11:19 AM

## 2020-07-12 NOTE — H&P (Signed)
All labs reviewed. Abnormal studies sent to patients PCP when indicated.  Previous H&P reviewed, patient examined, there are NO CHANGES.  Kathy Kawabata Porfilio9/9/202110:57 AM

## 2020-07-12 NOTE — Anesthesia Postprocedure Evaluation (Signed)
Anesthesia Post Note  Patient: Kathy Acosta  Procedure(s) Performed: CATARACT EXTRACTION PHACO AND INTRAOCULAR LENS PLACEMENT (IOC) RIGHT (Right Eye)     Patient location during evaluation: PACU Anesthesia Type: MAC Level of consciousness: awake and alert Pain management: pain level controlled Vital Signs Assessment: post-procedure vital signs reviewed and stable Respiratory status: spontaneous breathing Cardiovascular status: stable Postop Assessment: no headache Anesthetic complications: no   No complications documented.  Gillian Scarce

## 2020-07-13 ENCOUNTER — Encounter: Payer: Self-pay | Admitting: Ophthalmology

## 2020-07-23 ENCOUNTER — Encounter: Payer: Self-pay | Admitting: Ophthalmology

## 2020-07-23 DIAGNOSIS — H2512 Age-related nuclear cataract, left eye: Secondary | ICD-10-CM | POA: Diagnosis not present

## 2020-07-23 DIAGNOSIS — I10 Essential (primary) hypertension: Secondary | ICD-10-CM | POA: Diagnosis not present

## 2020-07-25 ENCOUNTER — Ambulatory Visit (INDEPENDENT_AMBULATORY_CARE_PROVIDER_SITE_OTHER): Payer: Medicare Other

## 2020-07-25 DIAGNOSIS — Z23 Encounter for immunization: Secondary | ICD-10-CM | POA: Diagnosis not present

## 2020-07-27 ENCOUNTER — Other Ambulatory Visit: Payer: Self-pay

## 2020-07-27 ENCOUNTER — Other Ambulatory Visit
Admission: RE | Admit: 2020-07-27 | Discharge: 2020-07-27 | Disposition: A | Discharge status: Home / Self Care | Payer: Medicare Other | Source: Ambulatory Visit | Attending: Ophthalmology | Admitting: Ophthalmology

## 2020-07-27 DIAGNOSIS — Z01812 Encounter for preprocedural laboratory examination: Secondary | ICD-10-CM | POA: Insufficient documentation

## 2020-07-27 DIAGNOSIS — Z20822 Contact with and (suspected) exposure to covid-19: Secondary | ICD-10-CM | POA: Diagnosis not present

## 2020-07-27 NOTE — Discharge Instructions (Signed)

## 2020-07-28 LAB — SARS CORONAVIRUS 2 (TAT 6-24 HRS): SARS Coronavirus 2: NEGATIVE

## 2020-07-31 ENCOUNTER — Encounter
Admission: RE | Disposition: A | Discharge status: Home / Self Care | Payer: Self-pay | Source: Home / Self Care | Attending: Ophthalmology

## 2020-07-31 ENCOUNTER — Other Ambulatory Visit: Payer: Self-pay

## 2020-07-31 ENCOUNTER — Encounter: Payer: Self-pay | Admitting: Ophthalmology

## 2020-07-31 ENCOUNTER — Ambulatory Visit: Payer: Medicare Other | Admitting: Anesthesiology

## 2020-07-31 ENCOUNTER — Ambulatory Visit
Admission: RE | Admit: 2020-07-31 | Discharge: 2020-07-31 | Disposition: A | Discharge status: Home / Self Care | Payer: Medicare Other | Attending: Ophthalmology | Admitting: Ophthalmology

## 2020-07-31 DIAGNOSIS — Z961 Presence of intraocular lens: Secondary | ICD-10-CM | POA: Insufficient documentation

## 2020-07-31 DIAGNOSIS — H2512 Age-related nuclear cataract, left eye: Secondary | ICD-10-CM | POA: Insufficient documentation

## 2020-07-31 DIAGNOSIS — R7302 Impaired glucose tolerance (oral): Secondary | ICD-10-CM | POA: Insufficient documentation

## 2020-07-31 DIAGNOSIS — Z86010 Personal history of colonic polyps: Secondary | ICD-10-CM | POA: Insufficient documentation

## 2020-07-31 DIAGNOSIS — Z833 Family history of diabetes mellitus: Secondary | ICD-10-CM | POA: Diagnosis not present

## 2020-07-31 DIAGNOSIS — I10 Essential (primary) hypertension: Secondary | ICD-10-CM | POA: Diagnosis not present

## 2020-07-31 DIAGNOSIS — Z981 Arthrodesis status: Secondary | ICD-10-CM | POA: Insufficient documentation

## 2020-07-31 DIAGNOSIS — Z79899 Other long term (current) drug therapy: Secondary | ICD-10-CM | POA: Insufficient documentation

## 2020-07-31 DIAGNOSIS — Z8249 Family history of ischemic heart disease and other diseases of the circulatory system: Secondary | ICD-10-CM | POA: Diagnosis not present

## 2020-07-31 DIAGNOSIS — R251 Tremor, unspecified: Secondary | ICD-10-CM | POA: Diagnosis not present

## 2020-07-31 DIAGNOSIS — Z888 Allergy status to other drugs, medicaments and biological substances status: Secondary | ICD-10-CM | POA: Insufficient documentation

## 2020-07-31 DIAGNOSIS — M1990 Unspecified osteoarthritis, unspecified site: Secondary | ICD-10-CM | POA: Diagnosis not present

## 2020-07-31 DIAGNOSIS — Z9841 Cataract extraction status, right eye: Secondary | ICD-10-CM | POA: Diagnosis not present

## 2020-07-31 DIAGNOSIS — Z90710 Acquired absence of both cervix and uterus: Secondary | ICD-10-CM | POA: Insufficient documentation

## 2020-07-31 DIAGNOSIS — H25812 Combined forms of age-related cataract, left eye: Secondary | ICD-10-CM | POA: Diagnosis not present

## 2020-07-31 DIAGNOSIS — Z800 Family history of malignant neoplasm of digestive organs: Secondary | ICD-10-CM | POA: Diagnosis not present

## 2020-07-31 HISTORY — PX: CATARACT EXTRACTION W/PHACO: SHX586

## 2020-07-31 SURGERY — PHACOEMULSIFICATION, CATARACT, WITH IOL INSERTION
Anesthesia: Monitor Anesthesia Care | Site: Eye | Laterality: Left

## 2020-07-31 MED ORDER — EPINEPHRINE PF 1 MG/ML IJ SOLN
INTRAOCULAR | Status: DC | PRN
  Administered 2020-07-31: 46 mL via OPHTHALMIC

## 2020-07-31 MED ORDER — LIDOCAINE HCL (PF) 2 % IJ SOLN
INTRAOCULAR | Status: DC | PRN
  Administered 2020-07-31: 1 mL

## 2020-07-31 MED ORDER — TETRACAINE HCL 0.5 % OP SOLN
1.0000 [drp] | OPHTHALMIC | Status: DC | PRN
  Administered 2020-07-31 (×3): 1 [drp] via OPHTHALMIC

## 2020-07-31 MED ORDER — BRIMONIDINE TARTRATE-TIMOLOL 0.2-0.5 % OP SOLN
OPHTHALMIC | Status: DC | PRN
  Administered 2020-07-31: 1 [drp] via OPHTHALMIC

## 2020-07-31 MED ORDER — OXYCODONE HCL 5 MG/5ML PO SOLN: 5.0000 mg | Freq: Once | ORAL | Status: DC | PRN

## 2020-07-31 MED ORDER — NA CHONDROIT SULF-NA HYALURON 40-17 MG/ML IO SOLN
INTRAOCULAR | Status: DC | PRN
  Administered 2020-07-31: 1 mL via INTRAOCULAR

## 2020-07-31 MED ORDER — MOXIFLOXACIN HCL 0.5 % OP SOLN
OPHTHALMIC | Status: DC | PRN
  Administered 2020-07-31: 0.2 mL via OPHTHALMIC

## 2020-07-31 MED ORDER — FENTANYL CITRATE (PF) 100 MCG/2ML IJ SOLN
INTRAMUSCULAR | Status: DC | PRN
Start: 2020-07-31 — End: 2020-07-31
  Administered 2020-07-31: 50 ug via INTRAVENOUS

## 2020-07-31 MED ORDER — MIDAZOLAM HCL 2 MG/2ML IJ SOLN
INTRAMUSCULAR | Status: DC | PRN
  Administered 2020-07-31: 1.5 mg via INTRAVENOUS

## 2020-07-31 MED ORDER — OXYCODONE HCL 5 MG PO TABS: 5.0000 mg | ORAL_TABLET | Freq: Once | ORAL | Status: DC | PRN

## 2020-07-31 MED ORDER — ARMC OPHTHALMIC DILATING DROPS
1.0000 "application " | OPHTHALMIC | Status: DC | PRN
  Administered 2020-07-31 (×3): 1 via OPHTHALMIC

## 2020-07-31 SURGICAL SUPPLY — 21 items
CANNULA ANT/CHMB 27G (MISCELLANEOUS) ×2 IMPLANT
CANNULA ANT/CHMB 27GA (MISCELLANEOUS) ×6 IMPLANT
GLOVE SURG LX 8.0 MICRO (GLOVE) ×4
GLOVE SURG LX STRL 8.0 MICRO (GLOVE) ×1 IMPLANT
GLOVE SURG TRIUMPH 8.0 PF LTX (GLOVE) ×3 IMPLANT
GOWN STRL REUS W/ TWL LRG LVL3 (GOWN DISPOSABLE) ×2 IMPLANT
GOWN STRL REUS W/TWL LRG LVL3 (GOWN DISPOSABLE) ×6
LENS IOL TECNIS EYHANCE 23.0 ×2 IMPLANT
MARKER SKIN DUAL TIP RULER LAB (MISCELLANEOUS) ×3 IMPLANT
NDL FILTER BLUNT 18X1 1/2 (NEEDLE) ×1 IMPLANT
NEEDLE FILTER BLUNT 18X 1/2SAF (NEEDLE) ×2
NEEDLE FILTER BLUNT 18X1 1/2 (NEEDLE) ×1 IMPLANT
PACK EYE AFTER SURG (MISCELLANEOUS) ×3 IMPLANT
PACK OPTHALMIC (MISCELLANEOUS) ×3 IMPLANT
PACK PORFILIO (MISCELLANEOUS) ×3 IMPLANT
SUT ETHILON 10-0 CS-B-6CS-B-6 (SUTURE)
SUTURE EHLN 10-0 CS-B-6CS-B-6 (SUTURE) IMPLANT
SYR 3ML LL SCALE MARK (SYRINGE) ×3 IMPLANT
SYR TB 1ML LUER SLIP (SYRINGE) ×3 IMPLANT
WATER STERILE IRR 250ML POUR (IV SOLUTION) ×3 IMPLANT
WIPE NON LINTING 3.25X3.25 (MISCELLANEOUS) ×3 IMPLANT

## 2020-07-31 NOTE — Transfer of Care (Signed)
Immediate Anesthesia Transfer of Care Note  Patient: Kathy Acosta  Procedure(s) Performed: CATARACT EXTRACTION PHACO AND INTRAOCULAR LENS PLACEMENT (IOC) LEFT (Left Eye)  Patient Location: PACU  Anesthesia Type: MAC  Level of Consciousness: awake, alert  and patient cooperative  Airway and Oxygen Therapy: Patient Spontanous Breathing and Patient connected to supplemental oxygen  Post-op Assessment: Post-op Vital signs reviewed, Patient's Cardiovascular Status Stable, Respiratory Function Stable, Patent Airway and No signs of Nausea or vomiting  Post-op Vital Signs: Reviewed and stable  Complications: No complications documented.

## 2020-07-31 NOTE — Anesthesia Preprocedure Evaluation (Signed)
Anesthesia Evaluation  Patient identified by MRN, date of birth, ID band Patient awake    Airway Mallampati: II  TM Distance: >3 FB Neck ROM: Full    Dental   Pulmonary    Pulmonary exam normal        Cardiovascular hypertension, Normal cardiovascular exam     Neuro/Psych negative neurological ROS     GI/Hepatic negative GI ROS,   Endo/Other    Renal/GU      Musculoskeletal  (+) Arthritis ,   Abdominal   Peds  Hematology   Anesthesia Other Findings   Reproductive/Obstetrics                             Anesthesia Physical Anesthesia Plan  ASA: II  Anesthesia Plan: MAC   Post-op Pain Management:    Induction:   PONV Risk Score and Plan:   Airway Management Planned: Nasal Cannula and Natural Airway  Additional Equipment:   Intra-op Plan:   Post-operative Plan:   Informed Consent: I have reviewed the patients History and Physical, chart, labs and discussed the procedure including the risks, benefits and alternatives for the proposed anesthesia with the patient or authorized representative who has indicated his/her understanding and acceptance.       Plan Discussed with: CRNA, Anesthesiologist and Surgeon  Anesthesia Plan Comments:         Anesthesia Quick Evaluation

## 2020-07-31 NOTE — H&P (Signed)
[facility]   Primary Care Physician:  Venia Carbon, MD Ophthalmologist: Dr. George Ina  Pre-Procedure History & Physical: HPI:  Kathy Acosta is a 74 y.o. female here for cataract surgery.   Past Medical History:  Diagnosis Date  . Arthritis   . Essential tremor    last 2 years  . Hx of colonic polyps   . Hypertension   . Impaired fasting glucose     Past Surgical History:  Procedure Laterality Date  . ABDOMINAL HYSTERECTOMY    . CATARACT EXTRACTION W/PHACO Right 07/12/2020   Procedure: CATARACT EXTRACTION PHACO AND INTRAOCULAR LENS PLACEMENT ([place]) RIGHT;  Surgeon: Birder Robson, MD;  Location: [place];  Service: Ophthalmology;  Laterality: Right;  7.35 0:46.4  . CERVICAL DISCECTOMY  1992   2 times  . CESAREAN SECTION     2 times  . COLONOSCOPY      Prior to Admission medications   Medication Sig Start Date End Date Taking? Authorizing Provider  acetaminophen (TYLENOL) 650 MG CR tablet Take 1,300 mg by mouth every 8 (eight) hours as needed for pain.   Yes [provider]  amLODipine (NORVASC) 10 MG tablet TAKE 1 TABLET BY MOUTH  DAILY Patient taking differently: Take 10 mg by mouth daily.  09/05/19  Yes Venia Carbon, MD  ascorbic acid (VITAMIN C) 500 MG tablet Take 500 mg by mouth daily.     Yes [provider]  benazepril (LOTENSIN) 20 MG tablet TAKE 1 TABLET BY MOUTH  DAILY 07/10/20  Yes Venia Carbon, MD  Calcium-Magnesium-Vitamin D (CALCIUM 1200+D3 PO) Take 1 tablet by mouth daily.   Yes [provider]  Multiple Vitamin (MULTIVITAMIN WITH MINERALS) TABS tablet Take 1 tablet by mouth daily.   Yes [provider]  Polyethyl Glycol-Propyl Glycol (SYSTANE OP) Place 1 drop into both eyes 3 (three) times daily.   Yes [provider]  potassium gluconate 595 (99 K) MG TABS tablet Take 595 mg by mouth daily.   Yes [provider]  valsartan-hydrochlorothiazide (DIOVAN-HCT) 160-12.5 MG tablet  TAKE 1 TABLET BY MOUTH  DAILY 06/07/19  Yes Venia Carbon, MD    Allergies as of 07/16/2020 - Review Complete 07/12/2020  Allergen Reaction Noted  . Doxazosin  08/27/2012    Family History  Problem Relation Age of Onset  . Hypertension Mother   . Hypertension Father   . Diabetes Father   . Diabetes Brother   . Cancer Paternal Aunt        colon  . Colon cancer Paternal Aunt   . Diabetes Brother   . Heart disease Sister   . Colon cancer Sister     Social History   Socioeconomic History  . Marital status: Widowed    Spouse name: Not on file  . Number of children: 2  . Years of education: Not on file  . Highest education level: Not on file  Occupational History  . Occupation: retired- Games developer. [employer]  Tobacco Use  . Smoking status: Never Smoker  . Smokeless tobacco: Never Used  Vaping Use  . Vaping Use: Never used  Substance and Sexual Activity  . Alcohol use: Yes    Alcohol/week: 0.0 standard drinks    Comment: wine - rare  . Drug use: No  . Sexual activity: Never    Birth control/protection: Surgical  Other Topics Concern  . Not on file  Social History Narrative   No living will   Requests sons  to be health care POA   Would like attempts at resuscitation.   Probably wouldn't want tube feeds   Social Determinants of Health   Financial Resource Strain:   . Difficulty of Paying Living Expenses: Not on file  Food Insecurity:   . Worried About Charity fundraiser in the Last Year: Not on file  . Ran Out of Food in the Last Year: Not on file  Transportation Needs:   . Lack of Transportation (Medical): Not on file  . Lack of Transportation (Non-Medical): Not on file  Physical Activity:   . Days of Exercise per Week: Not on file  . Minutes of Exercise per Session: Not on file  Stress:   . Feeling of Stress : Not on file  Social Connections:   . Frequency of Communication with Friends and Family: Not on file  . Frequency of Social Gatherings  with Friends and Family: Not on file  . Attends Religious Services: Not on file  . Active Member of Clubs or Organizations: Not on file  . Attends Archivist Meetings: Not on file  . Marital Status: Not on file  Intimate Partner Violence:   . Fear of Current or Ex-Partner: Not on file  . Emotionally Abused: Not on file  . Physically Abused: Not on file  . Sexually Abused: Not on file    Review of Systems: See HPI, otherwise negative ROS  Physical Exam: BP (!) 153/83   Pulse 72   Temp 97.7 F (36.5 C) (Temporal)   Resp 16   Ht 5\' 2"  (1.575 m)   Wt 72.6 kg   SpO2 97%   BMI 29.26 kg/m  General:   Alert,  pleasant and cooperative in NAD Head:  Normocephalic and atraumatic. Lungs:  Clear to auscultation.    Heart:  Regular rate and rhythm.   Impression/Plan: Kathy Acosta is here for cataract surgery.  Risks, benefits, limitations, and alternatives regarding cataract surgery have been reviewed with the patient.  Questions have been answered.  All parties agreeable.   Birder Robson, MD  07/31/2020, 12:08 PM

## 2020-07-31 NOTE — Op Note (Signed)
PREOPERATIVE DIAGNOSIS:  Nuclear sclerotic cataract of the left eye.   POSTOPERATIVE DIAGNOSIS:  Nuclear sclerotic cataract of the left eye.   OPERATIVE PROCEDURE:@   SURGEON:  Birder Robson, MD.   ANESTHESIA:  Anesthesiologist: Carlos American, MD CRNA: Mayme Genta, CRNA  1.      Managed anesthesia care. 2.     0.26ml of Shugarcaine was instilled following the paracentesis   COMPLICATIONS:  None.   TECHNIQUE:   Stop and chop   DESCRIPTION OF PROCEDURE:  The patient was examined and consented in the preoperative holding area where the aforementioned topical anesthesia was applied to the left eye and then brought back to the Operating Room where the left eye was prepped and draped in the usual sterile ophthalmic fashion and a lid speculum was placed. A paracentesis was created with the side port blade and the anterior chamber was filled with viscoelastic. A near clear corneal incision was performed with the steel keratome. A continuous curvilinear capsulorrhexis was performed with a cystotome followed by the capsulorrhexis forceps. Hydrodissection and hydrodelineation were carried out with BSS on a blunt cannula. The lens was removed in a stop and chop  technique and the remaining cortical material was removed with the irrigation-aspiration handpiece. The capsular bag was inflated with viscoelastic and the Technis [id number] lens was placed in the capsular bag without complication. The remaining viscoelastic was removed from the eye with the irrigation-aspiration handpiece. The wounds were hydrated. The anterior chamber was flushed with BSS and the eye was inflated to physiologic pressure. 0.32ml Vigamox was placed in the anterior chamber. The wounds were found to be water tight. The eye was dressed with Combigan. The patient was given protective glasses to wear throughout the day and a shield with which to sleep tonight. The patient was also given drops with which to begin a drop regimen today  and will follow-up with me in one day. Implant Name Type Inv. Item Serial No. Manufacturer Lot No. LRB No. Used Action  LENS II EYHANCE 23.0 - [id number]  LENS II EYHANCE 23.0 [id number] [employer]   Left 1 Implanted    Procedure(s) with comments: CATARACT EXTRACTION PHACO AND INTRAOCULAR LENS PLACEMENT (IOC) LEFT (Left) - 6.27 0:36.5  Electronically signed: Birder Robson 07/31/2020 12:35 PM

## 2020-07-31 NOTE — Anesthesia Procedure Notes (Signed)
Procedure Name: MAC Performed by: Ivelisse Culverhouse, CRNA Pre-anesthesia Checklist: Patient identified, Emergency Drugs available, Suction available, Timeout performed and Patient being monitored Patient Re-evaluated:Patient Re-evaluated prior to induction Oxygen Delivery Method: Nasal cannula Placement Confirmation: positive ETCO2       

## 2020-07-31 NOTE — Anesthesia Postprocedure Evaluation (Signed)
Anesthesia Post Note  Patient: Kathy Acosta  Procedure(s) Performed: CATARACT EXTRACTION PHACO AND INTRAOCULAR LENS PLACEMENT (IOC) LEFT (Left Eye)     Patient location during evaluation: PACU Anesthesia Type: MAC Level of consciousness: awake and alert Pain management: pain level controlled Vital Signs Assessment: post-procedure vital signs reviewed and stable Respiratory status: spontaneous breathing, nonlabored ventilation, respiratory function stable and patient connected to nasal cannula oxygen Cardiovascular status: stable and blood pressure returned to baseline Postop Assessment: no apparent nausea or vomiting Anesthetic complications: no   No complications documented.  Wanda Plump X[place] Hulet

## 2020-08-01 ENCOUNTER — Other Ambulatory Visit: Payer: Self-pay | Admitting: Internal Medicine

## 2020-08-01 ENCOUNTER — Encounter: Payer: Self-pay | Admitting: Ophthalmology

## 2020-08-01 DIAGNOSIS — Z1231 Encounter for screening mammogram for malignant neoplasm of breast: Secondary | ICD-10-CM

## 2020-09-04 ENCOUNTER — Encounter: Payer: Self-pay | Admitting: Internal Medicine

## 2020-09-05 ENCOUNTER — Other Ambulatory Visit: Payer: Self-pay | Admitting: Internal Medicine

## 2020-09-11 ENCOUNTER — Telehealth: Payer: Self-pay | Admitting: Internal Medicine

## 2020-09-11 NOTE — Telephone Encounter (Signed)
Pt calling to check on rx valsartan Pt stated [facility] stated this was denied  Pt is out of her meds

## 2020-09-12 MED ORDER — VALSARTAN-HYDROCHLOROTHIAZIDE 160-12.5 MG PO TABS: 1.0000 | ORAL_TABLET | Freq: Every day | ORAL | 3 refills | Status: DC

## 2020-09-12 NOTE — Telephone Encounter (Signed)
Spoke to pt. Advised her we never received a refill request for her valsartan. We received a request for amlodipine but not Valsartan-hctz. I have sent the rx to [facility] for her. I called [facility] and voided the rx I sent by mistake.

## 2020-09-18 ENCOUNTER — Ambulatory Visit: Payer: Medicare Other

## 2020-10-03 ENCOUNTER — Other Ambulatory Visit: Payer: Self-pay

## 2020-10-03 ENCOUNTER — Ambulatory Visit (AMBULATORY_SURGERY_CENTER): Payer: Self-pay

## 2020-10-03 VITALS — Ht 62.0 in | Wt 160.0 lb

## 2020-10-03 DIAGNOSIS — Z86010 Personal history of colonic polyps: Secondary | ICD-10-CM

## 2020-10-03 NOTE — Progress Notes (Signed)
No allergies to soy or egg Pt is not on blood thinners or diet pills Denies issues with sedation/intubation Denies atrial flutter/fib Denies constipation   Pt is aware of Covid safety and care partner requirements.      

## 2020-10-04 ENCOUNTER — Encounter: Payer: Self-pay | Admitting: Family Medicine

## 2020-10-04 ENCOUNTER — Ambulatory Visit (INDEPENDENT_AMBULATORY_CARE_PROVIDER_SITE_OTHER): Payer: Medicare Other | Admitting: Family Medicine

## 2020-10-04 VITALS — BP 140/80 | HR 69 | Temp 98.0°F | Ht 62.0 in | Wt 160.5 lb

## 2020-10-04 DIAGNOSIS — M654 Radial styloid tenosynovitis [de Quervain]: Secondary | ICD-10-CM

## 2020-10-04 NOTE — Patient Instructions (Signed)
Voltaren 1% gel. Over the counter You can apply up to 4 times a day Minimal is absorbed in the bloodstream Cost is about 9 dollars   DeQuairvain's Tenosynovitis  Wear Thumb Spica Splint for 3 weeks: wear all the time. (OK to take off to shower)  After 3 weeks, wean out of your splint. (Wear less and less each day)  Get a STRESS ball, practice squeezing it. Use a RUBBER BAND, practice opening it with your fingers and thumb.

## 2020-10-04 NOTE — Progress Notes (Signed)
Wisam Siefring T. Lupe Bonner, MD, [place] at [facility] [place], [address]  Phone: [phone]  FAX: [place] - 74 y.o. female  MRN [id number]  Date of Birth: 1946/06/09  Date: 10/04/2020  PCP: Venia Carbon, MD  Referral: Venia Carbon, MD  Chief Complaint  Patient presents with  . Wrist Pain    Left    This visit occurred during the SARS-CoV-2 public health emergency.  Safety protocols were in place, including screening questions prior to the visit, additional usage of staff PPE, and extensive cleaning of exam room while observing appropriate contact time as indicated for disinfecting solutions.   Subjective:   Kathy Acosta is a 74 y.o. very pleasant female patient with Body mass index is 29.36 kg/m. who presents with the following:  DeQ: Patient complains of 1 mo pain in the first dorsal compartment just proximal to the [facility] joint, but primarily in the extensor aspect of the thumb. There is some fullness in this area. She has not had any occult trauma. There is no ecchymosis. There is a mild amount of swelling. There is no significant CMC pain. No prior history of trauma or surgery in the affected hand.   Review of Systems is noted in the HPI, as appropriate   Objective:   BP 140/80   Pulse 69   Temp 98 F (36.7 C) (Temporal)   Ht 5\' 2"  (1.575 m)   Wt 160 lb 8 oz (72.8 kg)   SpO2 97%   BMI 29.36 kg/m    L hand Ecchymosis or edema: neg ROM wrist/hand/digits: full  Carpals, MCP's, digits: NT Distal Ulna and Radius: NT Ecchymosis or edema: neg No instability Cysts/nodules: neg Digit triggering: neg Finkelstein's test: Pos Pain with ulnar deviation Snuffbox tenderness: neg Scaphoid tubercle: NT Resisted supination: NT Full composite fist, no malrotation Grip, all digits: 5/5 str DIPJT: NT PIP JT: NT MCP JT: NT Axial load test:  neg Atrophy: neg  Hand sensation: intact   Radiology: No results found.  Assessment and Plan:     ICD-10-CM   1. De Quervain's tenosynovitis, left  M65.4    Classic tenosynovitis of the 1st dorsal compartment.  This anatomy was reviewed with the patient.  Ice bid for 2-3 days at a minimum Place the thumb in a thumb spica splint Hand exercises in a week or so - stress ball then opening hand with rubber bands  If continues to do poorly, may need to inject the 1st dorsal sheath   Patient Instructions  Voltaren 1% gel. Over the counter You can apply up to 4 times a day Minimal is absorbed in the bloodstream Cost is about 9 dollars   DeQuairvain's Tenosynovitis  Wear Thumb Spica Splint for 3 weeks: wear all the time. (OK to take off to shower)  After 3 weeks, wean out of your splint. (Wear less and less each day)  Get a STRESS ball, practice squeezing it. Use a RUBBER BAND, practice opening it with your fingers and thumb.     Follow-up: Return in about 1 month (around 11/04/2020).  Signed,  Maud Deed. Ryon Layton, MD   Outpatient Encounter Medications as of 10/04/2020  Medication Sig  . acetaminophen (TYLENOL) 650 MG CR tablet Take 1,300 mg by mouth every 8 (eight) hours as needed for pain.  Marland Kitchen amLODipine (NORVASC) 10 MG tablet TAKE 1 TABLET BY MOUTH  DAILY  .  ascorbic acid (VITAMIN C) 500 MG tablet Take 500 mg by mouth daily.    . benazepril (LOTENSIN) 20 MG tablet TAKE 1 TABLET BY MOUTH  DAILY  . Calcium-Magnesium-Vitamin D (CALCIUM 1200+D3 PO) Take 1 tablet by mouth daily.  . Chromium Picolinate (CHROMIUM PICOLATE PO) Take by mouth.  . Flaxseed, Linseed, (FLAX PO) Take by mouth.  . Multiple Vitamin (MULTIVITAMIN WITH MINERALS) TABS tablet Take 1 tablet by mouth daily.  . Omega-3 Fatty Acids (FISH OIL PO) Take by mouth.  Vladimir Faster Glycol-Propyl Glycol (SYSTANE OP) Place 1 drop into both eyes 3 (three) times daily.  . potassium gluconate 595 (99 K) MG TABS tablet Take  595 mg by mouth daily.  . prednisoLONE acetate (PRED FORTE) 1 % ophthalmic suspension SMARTSIG:In Eye(s)  . valsartan-hydrochlorothiazide (DIOVAN-HCT) 160-12.5 MG tablet Take 1 tablet by mouth daily.   No facility-administered encounter medications on file as of 10/04/2020.

## 2020-10-09 ENCOUNTER — Encounter: Payer: Self-pay | Admitting: Internal Medicine

## 2020-10-17 ENCOUNTER — Encounter: Payer: Medicare Other | Admitting: Internal Medicine

## 2020-10-23 ENCOUNTER — Encounter: Payer: Self-pay | Admitting: Internal Medicine

## 2020-10-23 ENCOUNTER — Ambulatory Visit (AMBULATORY_SURGERY_CENTER): Payer: Medicare Other | Admitting: Internal Medicine

## 2020-10-23 ENCOUNTER — Other Ambulatory Visit: Payer: Self-pay

## 2020-10-23 VITALS — BP 171/89 | HR 63 | Temp 98.9°F | Resp 10 | Ht 62.0 in | Wt 160.0 lb

## 2020-10-23 DIAGNOSIS — Z86010 Personal history of colonic polyps: Secondary | ICD-10-CM

## 2020-10-23 DIAGNOSIS — D123 Benign neoplasm of transverse colon: Secondary | ICD-10-CM

## 2020-10-23 DIAGNOSIS — K635 Polyp of colon: Secondary | ICD-10-CM

## 2020-10-23 DIAGNOSIS — D125 Benign neoplasm of sigmoid colon: Secondary | ICD-10-CM

## 2020-10-23 DIAGNOSIS — Z800 Family history of malignant neoplasm of digestive organs: Secondary | ICD-10-CM | POA: Diagnosis not present

## 2020-10-23 DIAGNOSIS — D124 Benign neoplasm of descending colon: Secondary | ICD-10-CM

## 2020-10-23 MED ORDER — SODIUM CHLORIDE 0.9 % IV SOLN: 500.0000 mL | Freq: Once | INTRAVENOUS | Status: DC

## 2020-10-23 NOTE — Progress Notes (Signed)
No problems noted in the recovery room. maw 

## 2020-10-23 NOTE — Patient Instructions (Addendum)
I found and removed 3 tiny polyps. All look benign. I will get them analyzed and let you know. I will not be recommending another routine colonoscopy.   You also have a condition called diverticulosis - common and not usually a problem. Please read the handout provided.  I appreciate the opportunity to care for you. Gatha Mayer, MD, [facility]   Handouts were given to you on polyps and diverticulosis. You may resume your current medications today. Await biopsy results.  Takes 2-3 weeks to receive pathology results.  You will receive a note on My Chart. Please call if any questions or concerns.     YOU HAD AN ENDOSCOPIC PROCEDURE TODAY AT [facility]:   Refer to the procedure report that was given to you for any specific questions about what was found during the examination.  If the procedure report does not answer your questions, please call your gastroenterologist to clarify.  If you requested that your care partner not be given the details of your procedure findings, then the procedure report has been included in a sealed envelope for you to review at your convenience later.  YOU SHOULD EXPECT: Some feelings of bloating in the abdomen. Passage of more gas than usual.  Walking can help get rid of the air that was put into your GI tract during the procedure and reduce the bloating. If you had a lower endoscopy (such as a colonoscopy or flexible sigmoidoscopy) you may notice spotting of blood in your stool or on the toilet paper. If you underwent a bowel prep for your procedure, you may not have a normal bowel movement for a few days.  Please Note:  You might notice some irritation and congestion in your nose or some drainage.  This is from the oxygen used during your procedure.  There is no need for concern and it should clear up in a day or so.  SYMPTOMS TO REPORT IMMEDIATELY:   Following lower endoscopy (colonoscopy or flexible sigmoidoscopy):  Excessive amounts of blood  in the stool  Significant tenderness or worsening of abdominal pains  Swelling of the abdomen that is new, acute  Fever of 100F or higher   For urgent or emergent issues, a gastroenterologist can be reached at any hour by calling [phone]. Do not use MyChart messaging for urgent concerns.    DIET:  We do recommend a small meal at first, but then you may proceed to your regular diet.  Drink plenty of fluids but you should avoid alcoholic beverages for 24 hours.  ACTIVITY:  You should plan to take it easy for the rest of today and you should NOT DRIVE or use heavy machinery until tomorrow (because of the sedation medicines used during the test).    FOLLOW UP: Our staff will call the number listed on your records 48-72 hours following your procedure to check on you and address any questions or concerns that you may have regarding the information given to you following your procedure. If we do not reach you, we will leave a message.  We will attempt to reach you two times.  During this call, we will ask if you have developed any symptoms of COVID 19. If you develop any symptoms (ie: fever, flu-like symptoms, shortness of breath, cough etc.) before then, please call [phone].  If you test positive for Covid 19 in the 2 weeks post procedure, please call and report this information to [place].    If any biopsies were  taken you will be contacted by phone or by letter within the next 1-3 weeks.  Please call us at [phone] if you have not heard about the biopsies in 3 weeks.    SIGNATURES/CONFIDENTIALITY: You and/or your care partner have signed paperwork which will be entered into your electronic medical record.  These signatures attest to the fact that that the information above on your After Visit Summary has been reviewed and is understood.  Full responsibility of the confidentiality of this discharge information lies with you and/or your care-partner.

## 2020-10-23 NOTE — Progress Notes (Signed)
To PACU, VSS. Report to [email or web address] 

## 2020-10-23 NOTE — Op Note (Signed)
[facility] Patient Name: Kathy Acosta Procedure Date: 10/23/2020 2:27 PM MRN: [id number] Endoscopist: Gatha Mayer , MD Age: 74 Referring MD:  Date of Birth: [birth year] Gender: Female Account #: [id number] Procedure:                Colonoscopy Indications:              Surveillance: Personal history of adenomatous                            polyps on last colonoscopy 5 years ago Medicines:                Propofol per Anesthesia, Monitored Anesthesia Care Procedure:                Pre-Anesthesia Assessment:                           - Prior to the procedure, a History and Physical                            was performed, and patient medications and                            allergies were reviewed. The patient's tolerance of                            previous anesthesia was also reviewed. The risks                            and benefits of the procedure and the sedation                            options and risks were discussed with the patient.                            All questions were answered, and informed consent                            was obtained. Prior Anticoagulants: The patient has                            taken no previous anticoagulant or antiplatelet                            agents. ASA Grade Assessment: II - A patient with                            mild systemic disease. After reviewing the risks                            and benefits, the patient was deemed in                            satisfactory condition to undergo the procedure.  After obtaining informed consent, the colonoscope                            was passed under direct vision. Throughout the                            procedure, the patient's blood pressure, pulse, and                            oxygen saturations were monitored continuously. The                            Olympus [id number]-[id number]) Colonoscope was                             introduced through the anus and advanced to the the                            cecum, identified by appendiceal orifice and                            ileocecal valve. The colonoscopy was performed                            without difficulty. The patient tolerated the                            procedure well. The quality of the bowel                            preparation was good. The bowel preparation used                            was Miralax via split dose instruction. The                            ileocecal valve, appendiceal orifice, and rectum                            were photographed. Scope In: 2:37:58 PM Scope Out: 2:55:32 PM Scope Withdrawal Time: 0 hours 13 minutes 3 seconds  Total Procedure Duration: 0 hours 17 minutes 34 seconds  Findings:                 The perianal and digital rectal examinations were                            normal.                           Two sessile polyps were found in the sigmoid colon                            and descending colon. The polyps were diminutive in  size. These polyps were removed with a cold snare.                            Resection and retrieval were complete. Verification                            of patient identification for the specimen was                            done. Estimated blood loss was minimal.                           A 1 mm polyp was found in the transverse colon. The                            polyp was sessile. The polyp was removed with a                            cold biopsy forceps. Resection and retrieval were                            complete. Verification of patient identification                            for the specimen was done. Estimated blood loss was                            minimal.                           Diverticula were found in the sigmoid colon and                            ascending colon.                           The exam was otherwise  without abnormality on                            direct and retroflexion views. Complications:            No immediate complications. Estimated Blood Loss:     Estimated blood loss was minimal. Impression:               - Two diminutive polyps in the sigmoid colon and in                            the descending colon, removed with a cold snare.                            Resected and retrieved.                           - One 1 mm polyp in the transverse colon, removed  with a cold biopsy forceps. Resected and retrieved.                           - Diverticulosis in the sigmoid colon and in the                            ascending colon.                           - The examination was otherwise normal on direct                            and retroflexion views.                           - Personal history of colonic polyps and FHx CRCA -                            sister Recommendation:           - Patient has a contact number available for                            emergencies. The signs and symptoms of potential                            delayed complications were discussed with the                            patient. Return to normal activities tomorrow.                            Written discharge instructions were provided to the                            patient.                           - Resume previous diet.                           - Continue present medications.                           - No repeat colonoscopy due to age. Gatha Mayer, MD 10/23/2020 3:02:21 PM This report has been signed electronically.

## 2020-10-23 NOTE — Progress Notes (Signed)
Called to room to assist during endoscopic procedure.  Patient ID and intended procedure confirmed with present staff. Received instructions for my participation in the procedure from the performing physician.  

## 2020-10-23 NOTE — Progress Notes (Signed)
VS-CW  Pt's states no medical or surgical changes since previsit or office visit.  

## 2020-10-25 ENCOUNTER — Telehealth: Payer: Self-pay

## 2020-10-25 NOTE — Telephone Encounter (Signed)
  Follow up Call-  Call back number 10/23/2020  Post procedure Call Back phone  # [phone]  Permission to leave phone message Yes  Some recent data might be hidden     Patient questions:  Do you have a fever, pain , or abdominal swelling? No. Pain Score  0 *  Have you tolerated food without any problems? Yes.    Have you been able to return to your normal activities? Yes.    Do you have any questions about your discharge instructions: Diet   No. Medications  No. Follow up visit  No.  Do you have questions or concerns about your Care? No.  Actions: * If pain score is 4 or above: No action needed, pain <4. 1. Have you developed a fever since your procedure? nop  2.   Have you had an respiratory symptoms (SOB or cough) since your procedure? no  3.   Have you tested positive for COVID 19 since your procedure no  4.   Have you had any family members/close contacts diagnosed with the COVID 19 since your procedure?  no   If yes to any of these questions please route to Joylene John, RN and Joella Prince, RN

## 2020-10-31 ENCOUNTER — Other Ambulatory Visit: Payer: Self-pay

## 2020-10-31 ENCOUNTER — Ambulatory Visit
Admission: RE | Admit: 2020-10-31 | Discharge: 2020-10-31 | Disposition: A | Discharge status: Home / Self Care | Payer: Medicare Other | Source: Ambulatory Visit | Attending: Internal Medicine | Admitting: Internal Medicine

## 2020-10-31 DIAGNOSIS — Z1231 Encounter for screening mammogram for malignant neoplasm of breast: Secondary | ICD-10-CM

## 2020-11-01 ENCOUNTER — Encounter: Payer: Self-pay | Admitting: Internal Medicine

## 2020-11-09 DIAGNOSIS — Z20822 Contact with and (suspected) exposure to covid-19: Secondary | ICD-10-CM | POA: Diagnosis not present

## 2020-11-14 ENCOUNTER — Ambulatory Visit: Payer: Medicare Other | Admitting: Family Medicine

## 2020-11-23 DIAGNOSIS — Z20822 Contact with and (suspected) exposure to covid-19: Secondary | ICD-10-CM | POA: Diagnosis not present

## 2020-11-30 ENCOUNTER — Encounter: Payer: Self-pay | Admitting: Internal Medicine

## 2020-11-30 ENCOUNTER — Other Ambulatory Visit: Payer: Self-pay

## 2020-11-30 ENCOUNTER — Ambulatory Visit (INDEPENDENT_AMBULATORY_CARE_PROVIDER_SITE_OTHER): Payer: Medicare Other | Admitting: Internal Medicine

## 2020-11-30 VITALS — BP 138/82 | HR 75 | Temp 97.5°F | Ht 61.5 in | Wt 158.0 lb

## 2020-11-30 DIAGNOSIS — G250 Essential tremor: Secondary | ICD-10-CM

## 2020-11-30 DIAGNOSIS — M159 Polyosteoarthritis, unspecified: Secondary | ICD-10-CM

## 2020-11-30 DIAGNOSIS — Z0000 Encounter for general adult medical examination without abnormal findings: Secondary | ICD-10-CM

## 2020-11-30 DIAGNOSIS — Z7189 Other specified counseling: Secondary | ICD-10-CM

## 2020-11-30 DIAGNOSIS — R7301 Impaired fasting glucose: Secondary | ICD-10-CM

## 2020-11-30 DIAGNOSIS — I10 Essential (primary) hypertension: Secondary | ICD-10-CM

## 2020-11-30 LAB — COMPREHENSIVE METABOLIC PANEL
ALT: 41 U/L — ABNORMAL HIGH (ref 0–35)
AST: 49 U/L — ABNORMAL HIGH (ref 0–37)
Albumin: 4.2 g/dL (ref 3.5–5.2)
Alkaline Phosphatase: 74 U/L (ref 39–117)
BUN: 20 mg/dL (ref 6–23)
CO2: 34 mEq/L — ABNORMAL HIGH (ref 19–32)
Calcium: 10.1 mg/dL (ref 8.4–10.5)
Chloride: 100 mEq/L (ref 96–112)
Creatinine, Ser: 0.86 mg/dL (ref 0.40–1.20)
GFR: 65.6 mL/min (ref >60.00)
Glucose, Bld: 108 mg/dL — ABNORMAL HIGH (ref 70–99)
Potassium: 3.9 mEq/L (ref 3.5–5.1)
Sodium: 139 mEq/L (ref 135–145)
Total Bilirubin: 0.5 mg/dL (ref 0.2–1.2)
Total Protein: 7.5 g/dL (ref 6.0–8.3)

## 2020-11-30 LAB — CBC
HCT: 40.2 % (ref 36.0–46.0)
Hemoglobin: 13.2 g/dL (ref 12.0–15.0)
MCHC: 32.9 g/dL (ref 30.0–36.0)
MCV: 84.7 fl (ref 78.0–100.0)
Platelets: 232 10*3/uL (ref 150.0–400.0)
RBC: 4.75 Mil/uL (ref 3.87–5.11)
RDW: 13.7 % (ref 11.5–15.5)
WBC: 4.7 10*3/uL (ref 4.0–10.5)

## 2020-11-30 LAB — HEMOGLOBIN A1C: Hgb A1c MFr Bld: 5.9 % (ref 4.6–6.5)

## 2020-11-30 NOTE — Progress Notes (Signed)
Subjective:    Patient ID: Kathy Acosta, female    DOB: 1946/01/31, 75 y.o.   MRN: [id number]  HPI Here for Medicare wellness visit and follow up of chronic health conditions This visit occurred during the SARS-CoV-2 public health emergency.  Safety protocols were in place, including screening questions prior to the visit, additional usage of staff PPE, and extensive cleaning of exam room while observing appropriate contact time as indicated for disinfecting solutions.   Reviewed form and advanced directives Reviewed other doctors No alcohol or tobacco Only walks rarely---discussed. Does stay active (stairs, yard, etc) Cataracts removed--vision fine. Dry eye stable Hearing is fine No falls No depression or anhedonia Independent with instrumental ADLs No sig memory problems  Doing okay Doesn't check BP No chest pain or SOB No longer gets heart flutter/palpitatons No edema No dizziness or syncope  Mild tremor is unchanged Doesn't affect any activities  Still with tendonitis in left wrist Uses brace at night Takes tylenol as needed---stiff in AM at times Back can be an issue  Appetite is good Weight fairly stable Past elevated sugar --113  Current Outpatient Medications on File Prior to Visit  Medication Sig Dispense Refill  . acetaminophen (TYLENOL) 650 MG CR tablet Take 1,300 mg by mouth every 8 (eight) hours as needed for pain.    Marland Kitchen amLODipine (NORVASC) 10 MG tablet TAKE 1 TABLET BY MOUTH  DAILY 90 tablet 3  . ascorbic acid (VITAMIN C) 500 MG tablet Take 500 mg by mouth daily.    . benazepril (LOTENSIN) 20 MG tablet TAKE 1 TABLET BY MOUTH  DAILY 90 tablet 3  . Calcium-Magnesium-Vitamin D (CALCIUM 1200+D3 PO) Take 1 tablet by mouth daily.    . Chromium Picolinate (CHROMIUM PICOLATE PO) Take by mouth.    . Flaxseed, Linseed, (FLAX PO) Take by mouth.    . Multiple Vitamin (MULTIVITAMIN WITH MINERALS) TABS tablet Take 1 tablet by mouth daily.    . Omega-3 Fatty Acids  (FISH OIL PO) Take by mouth.    Vladimir Faster Glycol-Propyl Glycol (SYSTANE OP) Place 1 drop into both eyes as needed.    . potassium gluconate 595 (99 K) MG TABS tablet Take 595 mg by mouth daily.    . prednisoLONE acetate (PRED FORTE) 1 % ophthalmic suspension SMARTSIG:In Eye(s)    . valsartan-hydrochlorothiazide (DIOVAN-HCT) 160-12.5 MG tablet Take 1 tablet by mouth daily. 90 tablet 3   No current facility-administered medications on file prior to visit.    Allergies  Allergen Reactions  . Doxazosin     dizziness    Past Medical History:  Diagnosis Date  . Arthritis   . Cataract    bilateral repair  . Essential tremor    last 2 years  . Hx of colonic polyps   . Hypertension   . Impaired fasting glucose     Past Surgical History:  Procedure Laterality Date  . ABDOMINAL HYSTERECTOMY    . CATARACT EXTRACTION W/PHACO Right 07/12/2020   Procedure: CATARACT EXTRACTION PHACO AND INTRAOCULAR LENS PLACEMENT ([place]) RIGHT;  Surgeon: Birder Robson, MD;  Location: [facility];  Service: Ophthalmology;  Laterality: Right;  7.35 0:46.4  . CATARACT EXTRACTION W/PHACO Left 07/31/2020   Procedure: CATARACT EXTRACTION PHACO AND INTRAOCULAR LENS PLACEMENT ([place]) LEFT;  Surgeon: Birder Robson, MD;  Location: [facility];  Service: Ophthalmology;  Laterality: Left;  6.27 0:36.5  . CERVICAL DISCECTOMY  1992   2 times  . CESAREAN SECTION     2 times  .  COLONOSCOPY  2016    Family History  Problem Relation Age of Onset  . Hypertension Mother   . Hypertension Father   . Diabetes Father   . Diabetes Brother   . Cancer Paternal Aunt        colon  . Colon cancer Paternal Aunt   . Diabetes Brother   . Heart disease Sister   . Colon cancer Sister   . Colon polyps Neg Hx   . Esophageal cancer Neg Hx   . Stomach cancer Neg Hx   . Rectal cancer Neg Hx     Social History   Socioeconomic History  . Marital status: Widowed    Spouse name: Not on file  . Number of  children: 2  . Years of education: Not on file  . Highest education level: Not on file  Occupational History  . Occupation: retired- Games developer. [employer]  Tobacco Use  . Smoking status: Never Smoker  . Smokeless tobacco: Never Used  Vaping Use  . Vaping Use: Never used  Substance and Sexual Activity  . Alcohol use: Yes    Alcohol/week: 0.0 standard drinks    Comment: wine - rare  . Drug use: No  . Sexual activity: Never    Birth control/protection: Surgical  Other Topics Concern  . Not on file  Social History Narrative   No living will   Requests sons to be health care POA   Would like attempts at resuscitation.   Probably wouldn't want tube feeds if cognitively unaware   Social Determinants of Health   Financial Resource Strain: Not on file  Food Insecurity: Not on file  Transportation Needs: Not on file  Physical Activity: Not on file  Stress: Not on file  Social Connections: Not on file  Intimate Partner Violence: Not on file   Review of Systems  Sleep is variable--mostly does okay Wears seat belt Using invisiline Rx---teeth okay No suspicious skin lesions Occasional heartburn--uses prilosec prn (tomato sauce). No dysphagia Bowels are fine--no blood.      Objective:   Physical Exam Constitutional:      Appearance: Normal appearance.  HENT:     Mouth/Throat:     Comments: No lesions Eyes:     Conjunctiva/sclera: Conjunctivae normal.     Pupils: Pupils are equal, round, and reactive to light.  Cardiovascular:     Rate and Rhythm: Normal rate and regular rhythm.     Pulses: Normal pulses.     Heart sounds: No murmur heard. No gallop.   Pulmonary:     Effort: Pulmonary effort is normal.     Breath sounds: Normal breath sounds. No wheezing or rales.  Abdominal:     Palpations: Abdomen is soft.     Tenderness: There is no abdominal tenderness.  Musculoskeletal:     Cervical back: Neck supple.     Right lower leg: No edema.     Left lower  leg: No edema.  Lymphadenopathy:     Cervical: No cervical adenopathy.  Skin:    General: Skin is warm.     Findings: No rash.  Neurological:     Mental Status: She is alert and oriented to person, place, and time.     Comments: President--- "[name], Obama" [id number] D-l-[id number]-w Recall--3/3  Psychiatric:        Mood and Affect: Mood normal.        Behavior: Behavior normal.            Assessment &  Plan:

## 2020-11-30 NOTE — Assessment & Plan Note (Signed)
Will check labs

## 2020-11-30 NOTE — Patient Instructions (Signed)
Please stop the benazepril. Take your blood pressure a couple of times in the next 2 months or so to make sure it is still okay off the benazepril

## 2020-11-30 NOTE — Progress Notes (Signed)
Hearing Screening   Method: Audiometry   125Hz 250Hz 500Hz 1000Hz 2000Hz 3000Hz 4000Hz 6000Hz 8000Hz  Right ear:   20 20 20  20    Left ear:   20 20 20  20    Vision Screening Comments: December 2021   

## 2020-11-30 NOTE — Assessment & Plan Note (Signed)
See social history 

## 2020-11-30 NOTE — Assessment & Plan Note (Signed)
Doing okay without any meds

## 2020-11-30 NOTE — Assessment & Plan Note (Signed)
I have personally reviewed the Medicare Annual Wellness questionnaire and have noted 1. The patient's medical and social history 2. Their use of alcohol, tobacco or illicit drugs 3. Their current medications and supplements 4. The patient's functional ability including ADL's, fall risks, home safety risks and hearing or visual             impairment. 5. Diet and physical activities 6. Evidence for depression or mood disorders  The patients weight, height, BMI and visual acuity have been recorded in the chart I have made referrals, counseling and provided education to the patient based review of the above and I have provided the pt with a written personalized care plan for preventive services.  I have provided you with a copy of your personalized plan for preventive services. Please take the time to review along with your updated medication list.  Last colonoscopy just done Recent mammogram--will continue every 2 years till 41 Discussed increasing exercise Had COVID booster and flu vaccine Did have second shingrix

## 2020-11-30 NOTE — Assessment & Plan Note (Signed)
Uses tylenol prn 

## 2020-11-30 NOTE — Assessment & Plan Note (Signed)
BP Readings from Last 3 Encounters:  11/30/20 138/82  10/23/20 (!) 171/89  10/04/20 140/80   Good control Will stop benazepril since on valsartan HCTZ and amlodipine

## 2020-12-05 ENCOUNTER — Other Ambulatory Visit: Payer: Self-pay

## 2020-12-05 ENCOUNTER — Ambulatory Visit (INDEPENDENT_AMBULATORY_CARE_PROVIDER_SITE_OTHER): Payer: Medicare Other | Admitting: Family Medicine

## 2020-12-05 ENCOUNTER — Encounter: Payer: Self-pay | Admitting: Family Medicine

## 2020-12-05 VITALS — BP 140/90 | HR 75 | Temp 98.0°F | Ht 61.5 in | Wt 159.5 lb

## 2020-12-05 DIAGNOSIS — M654 Radial styloid tenosynovitis [de Quervain]: Secondary | ICD-10-CM

## 2020-12-05 NOTE — Patient Instructions (Signed)
1. Let's set you up with some hand therapy  If that does not work, then  1. We can try oral steroids 2. If that does not work, then an injection often works

## 2020-12-05 NOTE — Progress Notes (Signed)
Kathy Acosta T. Kathy Storey, MD, [place] at [facility] [place], [address]  Phone: [phone]  FAX: [place] - 75 y.o. female  MRN [id number]  Date of Birth: 09/04/1946  Date: 12/05/2020  PCP: Kathy Carbon, MD  Referral: Kathy Carbon, MD  Chief Complaint  Patient presents with  . Wrist Pain    Left    This visit occurred during the SARS-CoV-2 public health emergency.  Safety protocols were in place, including screening questions prior to the visit, additional usage of staff PPE, and extensive cleaning of exam room while observing appropriate contact time as indicated for disinfecting solutions.   Subjective:   Kathy Acosta is a 75 y.o. very pleasant female patient with Body mass index is 29.65 kg/m. who presents with the following:  H/o DeQ on the L: I saw her last on October 04, 2020.  At that point she was having some significant de Quervain's without any other involvement at the hand or wrist.  She has been compliant and wore her thumb spica splint for about 3 weeks, now she is wearing this at nighttime only.  She is also been using some Voltaren gel.  Her symptoms continue.  She has had a recent cataract Does not want to do anything  Review of Systems is noted in the HPI, as appropriate   Objective:   BP 140/90   Pulse 75   Temp 98 F (36.7 C) (Temporal)   Ht 5' 1.5" (1.562 m)   Wt 159 lb 8 oz (72.3 kg)   SpO2 99%   BMI 29.65 kg/m    EXTR: No clubbing/cyanosis/edema Normal gait  Hand: L Ecchymosis or edema: neg ROM wrist/hand/digits/elbow: full  Carpals, MCP's, digits: NT Distal Ulna and Radius: NT Supination lift test: neg Ecchymosis or edema: neg Cysts/nodules: neg Finkelstein's test: POS Snuffbox tenderness: neg Scaphoid tubercle: NT Hook of Hamate: NT Resisted supination: NT Full composite fist Grip, all digits: 5/5  str No tenosynovitis Axial load test: neg Phalen's: neg Tinel's: neg Atrophy: neg  Hand sensation: intact   Radiology: No results found.  Assessment and Plan:     ICD-10-CM   1. De Quervain's tenosynovitis, left  M65.4 Ambulatory referral to Occupational Therapy   I am going to have the patient do some hand therapy with some Decadron iontophoresis in addition to in therapy for her de Quervain's.  We discussed other options including oral steroids and de Quervain's injection, but for now she would like to hold off on anything.  Orders Placed This Encounter  Procedures  . Ambulatory referral to Occupational Therapy    Follow-up: If no improvement after hand therapy, recommended that she call me and I had be happy to put her on some oral steroids.  Signed,  Maud Deed. Jlen Wintle, MD   Outpatient Encounter Medications as of 12/05/2020  Medication Sig  . acetaminophen (TYLENOL) 650 MG CR tablet Take 1,300 mg by mouth every 8 (eight) hours as needed for pain.  Marland Kitchen amLODipine (NORVASC) 10 MG tablet TAKE 1 TABLET BY MOUTH  DAILY  . ascorbic acid (VITAMIN C) 500 MG tablet Take 500 mg by mouth daily.  . Calcium-Magnesium-Vitamin D (CALCIUM 1200+D3 PO) Take 1 tablet by mouth daily.  . Chromium Picolinate (CHROMIUM PICOLATE PO) Take by mouth.  . Flaxseed, Linseed, (FLAX PO) Take by mouth.  . Multiple Vitamin (MULTIVITAMIN WITH MINERALS) TABS tablet Take  1 tablet by mouth daily.  . Omega-3 Fatty Acids (FISH OIL PO) Take by mouth.  Vladimir Faster Glycol-Propyl Glycol (SYSTANE OP) Place 1 drop into both eyes as needed.  . potassium gluconate 595 (99 K) MG TABS tablet Take 595 mg by mouth daily.  . valsartan-hydrochlorothiazide (DIOVAN-HCT) 160-12.5 MG tablet Take 1 tablet by mouth daily.  . [DISCONTINUED] prednisoLONE acetate (PRED FORTE) 1 % ophthalmic suspension SMARTSIG:In Eye(s)   No facility-administered encounter medications on file as of 12/05/2020.

## 2021-01-02 ENCOUNTER — Other Ambulatory Visit: Payer: Self-pay

## 2021-01-02 ENCOUNTER — Ambulatory Visit: Payer: Medicare Other | Attending: Family Medicine | Admitting: Occupational Therapy

## 2021-01-02 DIAGNOSIS — M25542 Pain in joints of left hand: Secondary | ICD-10-CM | POA: Diagnosis not present

## 2021-01-02 DIAGNOSIS — M6281 Muscle weakness (generalized): Secondary | ICD-10-CM | POA: Insufficient documentation

## 2021-01-02 DIAGNOSIS — M25532 Pain in left wrist: Secondary | ICD-10-CM | POA: Insufficient documentation

## 2021-01-02 NOTE — Therapy (Signed)
[facility] [address] [place], [place], [address] Phone: [phone]   Fax:  [phone]  Occupational Therapy Evaluation  Patient Details  Name: Kathy Acosta MRN: [id number] Date of Birth: 23-Jul-1946 Referring Provider (OT): Dr. Frederico Hamman Copland   Encounter Date: 01/02/2021   OT End of Session - 01/02/21 1320    Visit Number 1    Number of Visits 13    Date for OT Re-Evaluation 02/20/21    Authorization Type UHC MCR    Progress Note Due on Visit 10    OT Start Time 1230    OT Stop Time 1315    OT Time Calculation (min) 45 min    Activity Tolerance Patient tolerated treatment well    Behavior During Therapy [facility] for tasks assessed/performed           Past Medical History:  Diagnosis Date   Arthritis    Cataract    bilateral repair   Essential tremor    last 2 years   Hx of colonic polyps    Hypertension    Impaired fasting glucose     Past Surgical History:  Procedure Laterality Date   ABDOMINAL HYSTERECTOMY     CATARACT EXTRACTION W/PHACO Right 07/12/2020   Procedure: CATARACT EXTRACTION PHACO AND INTRAOCULAR LENS PLACEMENT ([place]) RIGHT;  Surgeon: Birder Robson, MD;  Location: [facility];  Service: Ophthalmology;  Laterality: Right;  7.35 0:46.4   CATARACT EXTRACTION W/PHACO Left 07/31/2020   Procedure: CATARACT EXTRACTION PHACO AND INTRAOCULAR LENS PLACEMENT ([place]) LEFT;  Surgeon: Birder Robson, MD;  Location: [facility];  Service: Ophthalmology;  Laterality: Left;  6.27 0:36.5   CERVICAL DISCECTOMY  1992   2 times   CESAREAN SECTION     2 times   COLONOSCOPY  2016    There were no vitals filed for this visit.   Subjective Assessment - 01/02/21 1231    Pertinent History DeQuervains Lt hand approx 4 months ago. PMH: OA, HTN, essential tremor    Special Tests + Finklesteins    Currently in Pain? Yes    Pain Score 9     Pain Location --   Along thumb and  radial wrist   Pain Orientation Left    Pain Descriptors / Indicators Sharp    Pain Onset More than a month ago    Pain Frequency Intermittent    Aggravating Factors  certain movements, certain tasks    Pain Relieving Factors rest             [facility] OT Assessment - 01/02/21 0001      Assessment   Medical Diagnosis DeQuervains tenosynovitis Lt hand    Referring Provider (OT) Dr. Frederico Hamman Copland    Onset Date/Surgical Date --   approx 4 months ago   Hand Dominance Right    Prior Therapy none      Precautions   Precautions None      Balance Screen   Has the patient fallen in the past 6 months No    Has the patient had a decrease in activity level because of a fear of falling?  No      Home  Environment   Lives With Spouse      Prior Function   Level of Independence Independent    Vocation Retired    Facilities manager, swimming      ADL   Eating/Feeding Independent    Grooming Modified independent    Cabin crew  independent    Lower Body Bathing Modified independent    Upper Body Dressing Increased time    Lower Body Dressing Increased time    Toilet Transfer Independent    Toileting - Social research officer, government -  Product/process development scientist Independent      IADL   Shopping --   difficulty holding groceries   Light Housekeeping --   Does all housekeeping but painful   Meal Prep Plans, prepares and serves adequate meals independently   painful w/ certain activties   Community Mobility Drives own vehicle    Medication Management Is responsible for taking medication in correct dosages at correct time      Mobility   Mobility Status Independent      Written Expression   Dominant Hand Right    Handwriting --   no changes     Vision - History   Baseline Vision Wears glasses only for reading    Additional Comments cataract surgery      Observation/Other Assessments   Observations positive Finklesteins test,  pain along thumb and radial wrist and w/ thumb radial abduction and ext      Sensation   Light Touch Appears Intact      Coordination   9 Hole Peg Test Right;Left    Right 9 Hole Peg Test 28.90 sec    Left 9 Hole Peg Test 34.47 sec      Edema   Edema mild  dorsal Rt wrist      ROM / Strength   AROM / PROM / Strength AROM      AROM   Overall AROM Comments BUE AROM WFL's except compensations Rt shoulder d/t pain      Left Hand AROM   L Thumb MCP 0-60 60 Degrees    L Thumb Radial ADduction/ABduction 0-55 75    L Thumb Palmar ADduction/ABduction 0-45 60      Hand Function   Right Hand Grip (lbs) 45 lbs    Right Hand Lateral Pinch 14 lbs    Right Hand 3 Point Pinch 11 lbs    Left Hand Grip (lbs) 32 lbs    Left Hand Lateral Pinch 10 lbs    Left 3 point pinch 10 lbs                    OT Treatments/Exercises (OP) - 01/02/21 0001      Splinting   Splinting Pt already has pre-fab thumb spica brace. Fitted for neoprene thumb/wrist brace today to allow more use w/ functional tasks but still provide some support to Lt thumb/wrist. Reviewed wear and care and issued.                   OT Short Term Goals - 01/02/21 1327      OT SHORT TERM GOAL #1   Title Independent with initial HEP (wrist movement seperate from thumb)    Time 3    Period Weeks    Status New      OT SHORT TERM GOAL #2   Title Pt to verbalize understanding with task modifications, potential A/E needs, and brace/splints that will increase independence and decrease pain    Time 3    Period Weeks    Status New      OT SHORT TERM GOAL #3   Title Pain will consistently be 5/10 or under    Time 3    Period Weeks  Status New      OT SHORT TERM GOAL #4   Title Independent w/ splint wear and care prn    Time 3    Period Weeks    Status On-going             OT Long Term Goals - 01/02/21 1329      OT LONG TERM GOAL #1   Title Independent with updated HEP    Time 6    Period  Weeks    Status New      OT LONG TERM GOAL #2   Title Grip strength Lt hand to be 38 lbs or greater    Baseline 32 lbs (Rt = 45 lbs)    Time 6    Period Weeks    Status New      OT LONG TERM GOAL #3   Title Lateral pinch Lt hand to be 12 lbs or greater    Baseline 10 lbs (Rt = 14 lbs)    Time 6    Period Weeks    Status New                 Plan - 01/02/21 1322    Clinical Impression Statement Pt is a 75 y.o. female who presents to [place] with DeQuervains tenosynovitis Lt hand. Pt reports she has had pain for approx 4 months now. Pt with pain at dorsal radial thumb and wrist Lt non dominant hand, decreased strength (especially lateral pinch) and decreased functional use of Lt hand. Pt would benefit from O.T. to address these deficits, reduce pain, and return pt to normal use of Lt hand.    OT Occupational Profile and History Problem Focused Assessment - Including review of records relating to presenting problem    Occupational performance deficits (Please refer to evaluation for details): IADL's;ADL's;Social Participation;Leisure    Body Structure / Function / Physical Skills ADL;Pain;Strength;Edema;UE functional use;ROM;IADL;Coordination    Rehab Potential Good    Clinical Decision Making Limited treatment options, no task modification necessary    Comorbidities Affecting Occupational Performance: May have comorbidities impacting occupational performance    Modification or Assistance to Complete Evaluation  No modification of tasks or assist necessary to complete eval    OT Frequency 2x / week    OT Duration 6 weeks   plus eval   OT Treatment/Interventions Self-care/ADL training;Moist Heat;Fluidtherapy;DME and/or AE instruction;Splinting;Therapeutic activities;Ultrasound;Therapeutic exercise;Cryotherapy;Iontophoresis;Passive range of motion;Paraffin;Manual Therapy;Patient/family education    Plan begin iontophoresis, task modifications/strategies    Consulted and Agree with Plan  of Care Patient           Patient will benefit from skilled therapeutic intervention in order to improve the following deficits and impairments:   Body Structure / Function / Physical Skills: ADL,Pain,Strength,Edema,UE functional use,ROM,IADL,Coordination       Visit Diagnosis: Pain in left wrist  Pain in thumb joint with movement of left hand  Muscle weakness (generalized)    Problem List Patient Active Problem List   Diagnosis Date Noted   Left thigh pain 11/29/2019   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

## 2021-01-09 ENCOUNTER — Other Ambulatory Visit: Payer: Self-pay

## 2021-01-09 ENCOUNTER — Ambulatory Visit: Payer: Medicare Other | Admitting: Occupational Therapy

## 2021-01-09 DIAGNOSIS — M25542 Pain in joints of left hand: Secondary | ICD-10-CM

## 2021-01-09 DIAGNOSIS — M6281 Muscle weakness (generalized): Secondary | ICD-10-CM | POA: Diagnosis not present

## 2021-01-09 DIAGNOSIS — M25532 Pain in left wrist: Secondary | ICD-10-CM

## 2021-01-09 NOTE — Patient Instructions (Signed)
  MAKE SURE TO KEEP WRIST STABLE AND IN NEUTRAL POSITION (pinky side of hand/wrist resting on table) FOR BELOW THUMB EXERCISES:   MP Flexion (Active)   Bend thumb to touch base of little finger, keeping tip joint straight. Repeat __10__ times. Do _4-6___ sessions per day.    Composite Extension (Active)   Bring thumb up and out in hitchhiker position.  Repeat __10__ times. Do _4-6___ sessions per day.    KEEP THUMB AND FINGERS RELAXED WHEN DOING WRIST EXERCISES  Wrist Flexion / Extension    Hold elbows at 90 and close to body, with palms down. Bend both wrists so fingers point up. Then bend wrist so fingers point down. Repeat sequence __10__ times per session. Do _4-6___ sessions per day. Hand Variation: Thumbs up

## 2021-01-09 NOTE — Therapy (Signed)
[facility] [address] [place], [place], [address] Phone: [phone]   Fax:  [phone]  Occupational Therapy Treatment  Patient Details  Name: Kathy Acosta MRN: [id number] Date of Birth: 02-Oct-1946 Referring Provider (OT): Dr. Frederico Hamman Copland   Encounter Date: 01/09/2021   OT End of Session - 01/09/21 1149    Visit Number 2    Number of Visits 13    Date for OT Re-Evaluation 02/20/21    Authorization Type UHC MCR    Progress Note Due on Visit 10    OT Start Time 1100    OT Stop Time 1145    OT Time Calculation (min) 45 min    Activity Tolerance Patient tolerated treatment well    Behavior During Therapy [facility] for tasks assessed/performed           Past Medical History:  Diagnosis Date  . Arthritis   . Cataract    bilateral repair  . Essential tremor    last 2 years  . Hx of colonic polyps   . Hypertension   . Impaired fasting glucose     Past Surgical History:  Procedure Laterality Date  . ABDOMINAL HYSTERECTOMY    . CATARACT EXTRACTION W/PHACO Right 07/12/2020   Procedure: CATARACT EXTRACTION PHACO AND INTRAOCULAR LENS PLACEMENT ([place]) RIGHT;  Surgeon: Birder Robson, MD;  Location: [facility];  Service: Ophthalmology;  Laterality: Right;  7.35 0:46.4  . CATARACT EXTRACTION W/PHACO Left 07/31/2020   Procedure: CATARACT EXTRACTION PHACO AND INTRAOCULAR LENS PLACEMENT ([place]) LEFT;  Surgeon: Birder Robson, MD;  Location: [facility];  Service: Ophthalmology;  Laterality: Left;  6.27 0:36.5  . CERVICAL DISCECTOMY  1992   2 times  . CESAREAN SECTION     2 times  . COLONOSCOPY  2016    There were no vitals filed for this visit.   Subjective Assessment - 01/09/21 1104    Subjective  Pain is worse today (especially in the morning). The brace you gave me is more comfortable and I sleep with that one    Pertinent History DeQuervains Lt hand approx 4 months ago. PMH: OA, HTN,  essential tremor    Special Tests + Finklesteins    Currently in Pain? Yes    Pain Score 9     Pain Location --   base of thumb (radially)   Pain Orientation Left    Pain Descriptors / Indicators Sharp    Pain Onset More than a month ago    Pain Frequency Intermittent    Aggravating Factors  certain movements, certain tasks    Pain Relieving Factors rest                        OT Treatments/Exercises (OP) - 01/09/21 0001      ADLs   ADL Comments Reviewed iontophroresis precautions/contraindications prior to ionto treatment. Pt had no contraindications. Also discussed task modifications/adaptations to reduce pain including: electric can opener, alternative ways to wring out washcloth and hold pots/pans. Also discussed using ice to reduce pain when needed (avoid heat especially after ionto)      Exercises   Exercises --   Pt issued thumb flex/radial abduction A/ROM with wrist stable/neutral position, and wrist flex/ext A/ROM w/ thumb/fingers relaxed - see pt instructions for details     Modalities   Modalities Iontophoresis      Iontophoresis   Type of Iontophoresis Dexamethasone    Location Base of thumb (radially)  Dose 2.0 mA, 4 mg/mL   Dose #1   Time 20 minutes on + 5 minutes set up = 25 minutes total                  OT Education - 01/09/21 1130    Education Details thumb ex's (keeping wrist neutral) and wrist ex's (keeping thumb relaxed), iontophoresis precautions/contraindications    Person(s) Educated Patient    Methods Explanation;Demonstration;Handout    Comprehension Verbalized understanding;Returned demonstration            OT Short Term Goals - 01/09/21 1149      OT SHORT TERM GOAL #1   Title Independent with initial HEP (wrist movement seperate from thumb)    Time 3    Period Weeks    Status On-going      OT SHORT TERM GOAL #2   Title Pt to verbalize understanding with task modifications, potential A/E needs, and brace/splints  that will increase independence and decrease pain    Time 3    Period Weeks    Status On-going      OT SHORT TERM GOAL #3   Title Pain will consistently be 5/10 or under    Time 3    Period Weeks    Status New      OT SHORT TERM GOAL #4   Title Independent w/ splint wear and care prn    Time 3    Period Weeks    Status On-going             OT Long Term Goals - 01/02/21 1329      OT LONG TERM GOAL #1   Title Independent with updated HEP    Time 6    Period Weeks    Status New      OT LONG TERM GOAL #2   Title Grip strength Lt hand to be 38 lbs or greater    Baseline 32 lbs (Rt = 45 lbs)    Time 6    Period Weeks    Status New      OT LONG TERM GOAL #3   Title Lateral pinch Lt hand to be 12 lbs or greater    Baseline 10 lbs (Rt = 14 lbs)    Time 6    Period Weeks    Status New                 Plan - 01/09/21 1150    Clinical Impression Statement Pt progressing towards STG's. Pt with greater understandng of pain reduction strategies.    OT Occupational Profile and History Problem Focused Assessment - Including review of records relating to presenting problem    Occupational performance deficits (Please refer to evaluation for details): IADL's;ADL's;Social Participation;Leisure    Body Structure / Function / Physical Skills ADL;Pain;Strength;Edema;UE functional use;ROM;IADL;Coordination    Rehab Potential Good    Clinical Decision Making Limited treatment options, no task modification necessary    Comorbidities Affecting Occupational Performance: May have comorbidities impacting occupational performance    Modification or Assistance to Complete Evaluation  No modification of tasks or assist necessary to complete eval    OT Frequency 2x / week    OT Duration 6 weeks   plus eval   OT Treatment/Interventions Self-care/ADL training;Moist Heat;Fluidtherapy;DME and/or AE instruction;Splinting;Therapeutic activities;Ultrasound;Therapeutic  exercise;Cryotherapy;Iontophoresis;Passive range of motion;Paraffin;Manual Therapy;Patient/family education    Plan continue iontophoresis #2    Consulted and Agree with Plan of Care Patient  Patient will benefit from skilled therapeutic intervention in order to improve the following deficits and impairments:   Body Structure / Function / Physical Skills: ADL,Pain,Strength,Edema,UE functional use,ROM,IADL,Coordination       Visit Diagnosis: Pain in left wrist  Pain in thumb joint with movement of left hand    Problem List Patient Active Problem List   Diagnosis Date Noted  . Left thigh pain 11/29/2019  . 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 Family history of colon cancer 05/21/2015  . Advance directive discussed with patient 11/21/2014  . Palpitations 05/16/2014  . Essential tremor   . Routine general medical examination at a health care facility 10/07/2011  . IMPAIRED FASTING GLUCOSE 08/25/2008  . Essential hypertension, benign 03/16/2007  . Generalized osteoarthrosis, involving multiple sites 03/16/2007      Carey Bullocks, OTR/L 01/09/2021, 11:57 AM  [facility] [address] [place], [place], [address] Phone: [phone]   Fax:  [phone]  Name: JONQUIL STUBBE MRN: [id number] Date of Birth: [birth year]

## 2021-01-14 ENCOUNTER — Ambulatory Visit: Payer: Medicare Other | Admitting: Occupational Therapy

## 2021-01-14 ENCOUNTER — Other Ambulatory Visit: Payer: Self-pay

## 2021-01-14 DIAGNOSIS — M25542 Pain in joints of left hand: Secondary | ICD-10-CM

## 2021-01-14 DIAGNOSIS — M25532 Pain in left wrist: Secondary | ICD-10-CM

## 2021-01-14 DIAGNOSIS — M6281 Muscle weakness (generalized): Secondary | ICD-10-CM | POA: Diagnosis not present

## 2021-01-14 NOTE — Therapy (Signed)
[facility] [address] [place], [place], [address] Phone: [phone]   Fax:  [phone]  Occupational Therapy Treatment  Patient Details  Name: Kathy Acosta MRN: [id number] Date of Birth: [birth year] Referring Provider (OT): Dr. Frederico Hamman Copland   Encounter Date: 01/14/2021   OT End of Session - 01/14/21 1235    Visit Number 3    Number of Visits 13    Date for OT Re-Evaluation 02/20/21    Authorization Type UHC MCR    Progress Note Due on Visit 10    OT Start Time 1230    OT Stop Time 1310    OT Time Calculation (min) 40 min    Activity Tolerance Patient tolerated treatment well    Behavior During Therapy [facility] for tasks assessed/performed           Past Medical History:  Diagnosis Date  . Arthritis   . Cataract    bilateral repair  . Essential tremor    last 2 years  . Hx of colonic polyps   . Hypertension   . Impaired fasting glucose     Past Surgical History:  Procedure Laterality Date  . ABDOMINAL HYSTERECTOMY    . CATARACT EXTRACTION W/PHACO Right 07/12/2020   Procedure: CATARACT EXTRACTION PHACO AND INTRAOCULAR LENS PLACEMENT ([place]) RIGHT;  Surgeon: Birder Robson, MD;  Location: [facility];  Service: Ophthalmology;  Laterality: Right;  7.35 0:46.4  . CATARACT EXTRACTION W/PHACO Left 07/31/2020   Procedure: CATARACT EXTRACTION PHACO AND INTRAOCULAR LENS PLACEMENT ([place]) LEFT;  Surgeon: Birder Robson, MD;  Location: [facility];  Service: Ophthalmology;  Laterality: Left;  6.27 0:36.5  . CERVICAL DISCECTOMY  1992   2 times  . CESAREAN SECTION     2 times  . COLONOSCOPY  2016    There were no vitals filed for this visit.   Subjective Assessment - 01/14/21 1232    Subjective  It's a little better    Pertinent History DeQuervains Lt hand approx 4 months ago. PMH: OA, HTN, essential tremor    Special Tests + Finklesteins    Currently in Pain? Yes    Pain Score 6      Pain Location --   base of thumb   Pain Orientation Left    Pain Descriptors / Indicators Sharp;Sore    Pain Type Acute pain    Pain Onset More than a month ago    Pain Frequency Intermittent    Aggravating Factors  certain movements    Pain Relieving Factors rest                        OT Treatments/Exercises (OP) - 01/14/21 0001      ADLs   ADL Comments Discussed further modifications including how to hold plate w/ less strain on thumb/wrist. Reviewed previous modifications      Iontophoresis   Type of Iontophoresis Dexamethasone    Location Base of thumb (radially)    Dose 2.0 mA, 4 mg/mL    Time 20 minutes on + 5 minutes set up = 25 minutes total                    OT Short Term Goals - 01/14/21 1313      OT SHORT TERM GOAL #1   Title Independent with initial HEP (wrist movement seperate from thumb)    Time 3    Period Weeks    Status Achieved  OT SHORT TERM GOAL #2   Title Pt to verbalize understanding with task modifications, potential A/E needs, and brace/splints that will increase independence and decrease pain    Time 3    Period Weeks    Status On-going      OT SHORT TERM GOAL #3   Title Pain will consistently be 5/10 or under    Time 3    Period Weeks    Status On-going      OT SHORT TERM GOAL #4   Title Independent w/ splint wear and care prn    Time 3    Period Weeks    Status Achieved             OT Long Term Goals - 01/02/21 1329      OT LONG TERM GOAL #1   Title Independent with updated HEP    Time 6    Period Weeks    Status New      OT LONG TERM GOAL #2   Title Grip strength Lt hand to be 38 lbs or greater    Baseline 32 lbs (Rt = 45 lbs)    Time 6    Period Weeks    Status New      OT LONG TERM GOAL #3   Title Lateral pinch Lt hand to be 12 lbs or greater    Baseline 10 lbs (Rt = 14 lbs)    Time 6    Period Weeks    Status New                 Plan - 01/14/21 1313    Clinical  Impression Statement Pt has met 2/4 STG's and progressing towards remaining STG's.    OT Occupational Profile and History Problem Focused Assessment - Including review of records relating to presenting problem    Occupational performance deficits (Please refer to evaluation for details): IADL's;ADL's;Social Participation;Leisure    Body Structure / Function / Physical Skills ADL;Pain;Strength;Edema;UE functional use;ROM;IADL;Coordination    Rehab Potential Good    Clinical Decision Making Limited treatment options, no task modification necessary    Comorbidities Affecting Occupational Performance: May have comorbidities impacting occupational performance    Modification or Assistance to Complete Evaluation  No modification of tasks or assist necessary to complete eval    OT Frequency 2x / week    OT Duration 6 weeks   plus eval   OT Treatment/Interventions Self-care/ADL training;Moist Heat;Fluidtherapy;DME and/or AE instruction;Splinting;Therapeutic activities;Ultrasound;Therapeutic exercise;Cryotherapy;Iontophoresis;Passive range of motion;Paraffin;Manual Therapy;Patient/family education    Plan continue iontophoresis #3    Consulted and Agree with Plan of Care Patient           Patient will benefit from skilled therapeutic intervention in order to improve the following deficits and impairments:   Body Structure / Function / Physical Skills: ADL,Pain,Strength,Edema,UE functional use,ROM,IADL,Coordination       Visit Diagnosis: Pain in left wrist  Pain in thumb joint with movement of left hand    Problem List Patient Active Problem List   Diagnosis Date Noted  . Left thigh pain 11/29/2019  . 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 Family history of colon cancer 05/21/2015  . Advance directive discussed with patient 11/21/2014  . Palpitations 05/16/2014  . Essential tremor   . Routine general medical examination at a health care facility 10/07/2011  . IMPAIRED FASTING GLUCOSE 08/25/2008   . Essential hypertension, benign 03/16/2007  . Generalized osteoarthrosis, involving multiple sites 03/16/2007    Kathy Acosta, OTR/L 01/14/2021, 1:14  PM  [facility] [address] [place], [place], [address] Phone: [phone]   Fax:  [phone]  Name: Kathy Acosta MRN: [id number] Date of Birth: [birth year]

## 2021-01-16 ENCOUNTER — Other Ambulatory Visit: Payer: Self-pay

## 2021-01-16 ENCOUNTER — Ambulatory Visit: Payer: Medicare Other | Admitting: Occupational Therapy

## 2021-01-16 DIAGNOSIS — M25542 Pain in joints of left hand: Secondary | ICD-10-CM | POA: Diagnosis not present

## 2021-01-16 DIAGNOSIS — M25532 Pain in left wrist: Secondary | ICD-10-CM | POA: Diagnosis not present

## 2021-01-16 DIAGNOSIS — M6281 Muscle weakness (generalized): Secondary | ICD-10-CM

## 2021-01-16 NOTE — Therapy (Signed)
[facility] [address] [place], [place], [address] Phone: [phone]   Fax:  [phone]  Occupational Therapy Treatment  Patient Details  Name: Kathy Acosta MRN: [id number] Date of Birth: Dec 04, 1945 Referring Provider (OT): Dr. Frederico Hamman Copland   Encounter Date: 01/16/2021   OT End of Session - 01/16/21 1225    Visit Number 4    Number of Visits 13    Date for OT Re-Evaluation 02/20/21    Authorization Type UHC MCR    Progress Note Due on Visit 10    OT Start Time 1100    OT Stop Time 1145    OT Time Calculation (min) 45 min    Activity Tolerance Patient tolerated treatment well    Behavior During Therapy [facility] for tasks assessed/performed           Past Medical History:  Diagnosis Date  . Arthritis   . Cataract    bilateral repair  . Essential tremor    last 2 years  . Hx of colonic polyps   . Hypertension   . Impaired fasting glucose     Past Surgical History:  Procedure Laterality Date  . ABDOMINAL HYSTERECTOMY    . CATARACT EXTRACTION W/PHACO Right 07/12/2020   Procedure: CATARACT EXTRACTION PHACO AND INTRAOCULAR LENS PLACEMENT ([place]) RIGHT;  Surgeon: Birder Robson, MD;  Location: [facility];  Service: Ophthalmology;  Laterality: Right;  7.35 0:46.4  . CATARACT EXTRACTION W/PHACO Left 07/31/2020   Procedure: CATARACT EXTRACTION PHACO AND INTRAOCULAR LENS PLACEMENT ([place]) LEFT;  Surgeon: Birder Robson, MD;  Location: [facility];  Service: Ophthalmology;  Laterality: Left;  6.27 0:36.5  . CERVICAL DISCECTOMY  1992   2 times  . CESAREAN SECTION     2 times  . COLONOSCOPY  2016    There were no vitals filed for this visit.   Subjective Assessment - 01/16/21 1115    Subjective  It's getting better and those modifications you suggested are helping    Pertinent History DeQuervains Lt hand approx 4 months ago. PMH: OA, HTN, essential tremor    Special Tests + Finklesteins     Currently in Pain? Yes    Pain Score 5     Pain Location --   base of thumb   Pain Orientation Left    Pain Descriptors / Indicators Sore;Sharp    Pain Type Acute pain    Pain Onset More than a month ago    Aggravating Factors  thumb radial abduction    Pain Relieving Factors rest, ionto                        OT Treatments/Exercises (OP) - 01/16/21 0001      ADLs   ADL Comments Pt shown various jar openers and made copy of trio jar opener      Wrist Exercises   Other wrist exercises continued wrist and thumb ex's separate from one another after iontophoresis      Modalities   Modalities Cryotherapy      Cryotherapy   Number Minutes Cryotherapy 5 Minutes after ex's above   Cryotherapy Location --   base of thumb/radial wrist   Type of Cryotherapy Ice massage - discussed proper techniques if pt were to do ice massage vs. Ice pack at home     Iontophoresis   Type of Iontophoresis Dexamethasone   #3   Location Base of thumb (radially)    Dose 2.0  mA, 4 mg/mL    Time 20 minutes on + 5 minutes set up = 25 minutes total                    OT Short Term Goals - 01/16/21 1225      OT SHORT TERM GOAL #1   Title Independent with initial HEP (wrist movement seperate from thumb)    Time 3    Period Weeks    Status Achieved      OT SHORT TERM GOAL #2   Title Pt to verbalize understanding with task modifications, potential A/E needs, and brace/splints that will increase independence and decrease pain    Time 3    Period Weeks    Status On-going      OT SHORT TERM GOAL #3   Title Pain will consistently be 5/10 or under    Time 3    Period Weeks    Status On-going      OT SHORT TERM GOAL #4   Title Independent w/ splint wear and care prn    Time 3    Period Weeks    Status Achieved             OT Long Term Goals - 01/02/21 1329      OT LONG TERM GOAL #1   Title Independent with updated HEP    Time 6    Period Weeks    Status New       OT LONG TERM GOAL #2   Title Grip strength Lt hand to be 38 lbs or greater    Baseline 32 lbs (Rt = 45 lbs)    Time 6    Period Weeks    Status New      OT LONG TERM GOAL #3   Title Lateral pinch Lt hand to be 12 lbs or greater    Baseline 10 lbs (Rt = 14 lbs)    Time 6    Period Weeks    Status New                 Plan - 01/16/21 1225    Clinical Impression Statement Pt has met 2/4 STG's and progressing towards remaining STG's. Pt reports decrease in symptoms and pain    OT Occupational Profile and History Problem Focused Assessment - Including review of records relating to presenting problem    Occupational performance deficits (Please refer to evaluation for details): IADL's;ADL's;Social Participation;Leisure    Body Structure / Function / Physical Skills ADL;Pain;Strength;Edema;UE functional use;ROM;IADL;Coordination    Rehab Potential Good    Clinical Decision Making Limited treatment options, no task modification necessary    Comorbidities Affecting Occupational Performance: May have comorbidities impacting occupational performance    Modification or Assistance to Complete Evaluation  No modification of tasks or assist necessary to complete eval    OT Frequency 2x / week    OT Duration 6 weeks   plus eval   OT Treatment/Interventions Self-care/ADL training;Moist Heat;Fluidtherapy;DME and/or AE instruction;Splinting;Therapeutic activities;Ultrasound;Therapeutic exercise;Cryotherapy;Iontophoresis;Passive range of motion;Paraffin;Manual Therapy;Patient/family education    Plan continue iontophoresis dose #4, schedule 3 more appointments following last ionto treatment (#6)    Consulted and Agree with Plan of Care Patient           Patient will benefit from skilled therapeutic intervention in order to improve the following deficits and impairments:   Body Structure / Function / Physical Skills: ADL,Pain,Strength,Edema,UE functional use,ROM,IADL,Coordination        Visit Diagnosis:  Pain in left wrist  Pain in thumb joint with movement of left hand  Muscle weakness (generalized)    Problem List Patient Active Problem List   Diagnosis Date Noted  . Left thigh pain 11/29/2019  . 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 Family history of colon cancer 05/21/2015  . Advance directive discussed with patient 11/21/2014  . Palpitations 05/16/2014  . Essential tremor   . Routine general medical examination at a health care facility 10/07/2011  . IMPAIRED FASTING GLUCOSE 08/25/2008  . Essential hypertension, benign 03/16/2007  . Generalized osteoarthrosis, involving multiple sites 03/16/2007    Kathy Acosta, Kathy Acosta 01/16/2021, 12:27 PM  [facility] [address] [place], [place], [address] Phone: [phone]   Fax:  [phone]  Name: Kathy Acosta MRN: [id number] Date of Birth: 14-Jul-1946

## 2021-01-21 ENCOUNTER — Other Ambulatory Visit: Payer: Self-pay

## 2021-01-21 ENCOUNTER — Ambulatory Visit: Payer: Medicare Other | Admitting: Occupational Therapy

## 2021-01-21 DIAGNOSIS — M25532 Pain in left wrist: Secondary | ICD-10-CM

## 2021-01-21 DIAGNOSIS — M25542 Pain in joints of left hand: Secondary | ICD-10-CM

## 2021-01-21 DIAGNOSIS — M6281 Muscle weakness (generalized): Secondary | ICD-10-CM | POA: Diagnosis not present

## 2021-01-21 NOTE — Patient Instructions (Signed)
Wrist Radial Deviation: Isometric    With left forearm resting on thigh, thumb up, use other hand to resist upward movement of hand at wrist. Hold __10__ seconds. Relax. Repeat __5__ times per set. Do _2-3___ sessions per day.  Wrist Extension: Isometric    With left forearm resting palm down on thigh, resist upward movement of hand with other hand. Hold _10___ seconds. Relax. Repeat __5__ times per set.  Do _2-3___ sessions per day.  Wrist Ulnar Deviation: Isometric    With left forearm resting on thigh, thumb up, use other hand to resist downward movement of hand at wrist. Hold __10__ seconds. Relax. Repeat _5___ times per set. Do __2-3__ sessions per day.

## 2021-01-21 NOTE — Therapy (Signed)
[facility] [address] [place], [place], [address] Phone: [phone]   Fax:  [phone]  Occupational Therapy Treatment  Patient Details  Name: Kathy Acosta MRN: [id number] Date of Birth: 11/13/1945 Referring Provider (OT): Dr. Frederico Hamman Copland   Encounter Date: 01/21/2021   OT End of Session - 01/21/21 1343    Visit Number 5    Number of Visits 13    Date for OT Re-Evaluation 02/20/21    Authorization Type UHC MCR    Progress Note Due on Visit 10    OT Start Time 1230    OT Stop Time 1320    OT Time Calculation (min) 50 min    Activity Tolerance Patient tolerated treatment well    Behavior During Therapy [facility] for tasks assessed/performed           Past Medical History:  Diagnosis Date  . Arthritis   . Cataract    bilateral repair  . Essential tremor    last 2 years  . Hx of colonic polyps   . Hypertension   . Impaired fasting glucose     Past Surgical History:  Procedure Laterality Date  . ABDOMINAL HYSTERECTOMY    . CATARACT EXTRACTION W/PHACO Right 07/12/2020   Procedure: CATARACT EXTRACTION PHACO AND INTRAOCULAR LENS PLACEMENT ([place]) RIGHT;  Surgeon: Birder Robson, MD;  Location: [facility];  Service: Ophthalmology;  Laterality: Right;  7.35 0:46.4  . CATARACT EXTRACTION W/PHACO Left 07/31/2020   Procedure: CATARACT EXTRACTION PHACO AND INTRAOCULAR LENS PLACEMENT ([place]) LEFT;  Surgeon: Birder Robson, MD;  Location: [facility];  Service: Ophthalmology;  Laterality: Left;  6.27 0:36.5  . CERVICAL DISCECTOMY  1992   2 times  . CESAREAN SECTION     2 times  . COLONOSCOPY  2016    There were no vitals filed for this visit.   Subjective Assessment - 01/21/21 1237    Subjective  It hurts more when I move my wrist this way (re: RD)    Pertinent History DeQuervains Lt hand approx 4 months ago. PMH: OA, HTN, essential tremor    Special Tests + Finklesteins    Currently in  Pain? Yes    Pain Score 5     Pain Location --   base of thumb   Pain Orientation Left    Pain Descriptors / Indicators Sharp    Pain Type Acute pain    Pain Onset More than a month ago                        OT Treatments/Exercises (OP) - 01/21/21 0001      Wrist Exercises   Other wrist exercises Pt shown RD w/ thumb relaxed which greatly reduced pain - pt instructed to continue but to avoid forced or end range motion    Other wrist exercises Pt issued isometric wrist HEP - see pt instructions for details.      Iontophoresis   Type of Iontophoresis Dexamethasone    Location Base of thumb (radially)    Dose 2.0 mA, 4 mg/mL (dose #4)   Time 20 minutes on + 5 minutes set up = 25 minutes total                  OT Education - 01/21/21 1249    Education Details Wrist isometric HEP    Person(s) Educated Patient    Methods Explanation;Demonstration;Handout    Comprehension Verbalized understanding;Returned demonstration  OT Short Term Goals - 01/21/21 1343      OT SHORT TERM GOAL #1   Title Independent with initial HEP (wrist movement seperate from thumb)    Time 3    Period Weeks    Status Achieved      OT SHORT TERM GOAL #2   Title Pt to verbalize understanding with task modifications, potential A/E needs, and brace/splints that will increase independence and decrease pain    Time 3    Period Weeks    Status Achieved      OT SHORT TERM GOAL #3   Title Pain will consistently be 5/10 or under    Time 3    Period Weeks    Status On-going      OT SHORT TERM GOAL #4   Title Independent w/ splint wear and care prn    Time 3    Period Weeks    Status Achieved             OT Long Term Goals - 01/02/21 1329      OT LONG TERM GOAL #1   Title Independent with updated HEP    Time 6    Period Weeks    Status New      OT LONG TERM GOAL #2   Title Grip strength Lt hand to be 38 lbs or greater    Baseline 32 lbs (Rt = 45 lbs)     Time 6    Period Weeks    Status New      OT LONG TERM GOAL #3   Title Lateral pinch Lt hand to be 12 lbs or greater    Baseline 10 lbs (Rt = 14 lbs)    Time 6    Period Weeks    Status New                 Plan - 01/21/21 1344    Clinical Impression Statement Pt has met 3/4 STG's and progressing towards remaining STG. Pt reports decrease in symptoms and pain except w/ RD however can reduce pain when thumb relaxed    OT Occupational Profile and History Problem Focused Assessment - Including review of records relating to presenting problem    Occupational performance deficits (Please refer to evaluation for details): IADL's;ADL's;Social Participation;Leisure    Body Structure / Function / Physical Skills ADL;Pain;Strength;Edema;UE functional use;ROM;IADL;Coordination    Rehab Potential Good    Clinical Decision Making Limited treatment options, no task modification necessary    Comorbidities Affecting Occupational Performance: May have comorbidities impacting occupational performance    Modification or Assistance to Complete Evaluation  No modification of tasks or assist necessary to complete eval    OT Frequency 2x / week    OT Duration 6 weeks   plus eval   OT Treatment/Interventions Self-care/ADL training;Moist Heat;Fluidtherapy;DME and/or AE instruction;Splinting;Therapeutic activities;Ultrasound;Therapeutic exercise;Cryotherapy;Iontophoresis;Passive range of motion;Paraffin;Manual Therapy;Patient/family education    Plan continue iontophoresis dose #5, review isometric HEP prn, try light resistance clothespins as able    Consulted and Agree with Plan of Care Patient           Patient will benefit from skilled therapeutic intervention in order to improve the following deficits and impairments:   Body Structure / Function / Physical Skills: ADL,Pain,Strength,Edema,UE functional use,ROM,IADL,Coordination       Visit Diagnosis: Pain in left wrist  Pain in thumb joint  with movement of left hand  Muscle weakness (generalized)    Problem List Patient Active Problem List  Diagnosis Date Noted  . Left thigh pain 11/29/2019  . 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 Family history of colon cancer 05/21/2015  . Advance directive discussed with patient 11/21/2014  . Palpitations 05/16/2014  . Essential tremor   . Routine general medical examination at a health care facility 10/07/2011  . IMPAIRED FASTING GLUCOSE 08/25/2008  . Essential hypertension, benign 03/16/2007  . Generalized osteoarthrosis, involving multiple sites 03/16/2007    Carey Bullocks, OTR/L 01/21/2021, 1:47 PM  [facility] [address] [place], [place], [address] Phone: [phone]   Fax:  [phone]  Name: Kathy Acosta MRN: [id number] Date of Birth: [birth year]

## 2021-01-23 ENCOUNTER — Other Ambulatory Visit: Payer: Self-pay

## 2021-01-23 ENCOUNTER — Encounter: Payer: Self-pay | Admitting: Occupational Therapy

## 2021-01-23 ENCOUNTER — Ambulatory Visit: Payer: Medicare Other | Admitting: Occupational Therapy

## 2021-01-23 DIAGNOSIS — M6281 Muscle weakness (generalized): Secondary | ICD-10-CM

## 2021-01-23 DIAGNOSIS — M25542 Pain in joints of left hand: Secondary | ICD-10-CM

## 2021-01-23 DIAGNOSIS — M25532 Pain in left wrist: Secondary | ICD-10-CM

## 2021-01-23 NOTE — Therapy (Signed)
[facility] [address] [place], [place], [address] Phone: [phone]   Fax:  [phone]  Occupational Therapy Treatment  Patient Details  Name: Kathy Acosta MRN: [id number] Date of Birth: 12-23-1945 Referring Provider (OT): Dr. Frederico Hamman Copland   Encounter Date: 01/23/2021   OT End of Session - 01/23/21 1236    Visit Number 6    Number of Visits 13    Date for OT Re-Evaluation 02/20/21    Authorization Type U[employer] MCR    OT Start Time 1234    OT Stop Time 1315    OT Time Calculation (min) 41 min           Past Medical History:  Diagnosis Date  . Arthritis   . Cataract    bilateral repair  . Essential tremor    last 2 years  . Hx of colonic polyps   . Hypertension   . Impaired fasting glucose     Past Surgical History:  Procedure Laterality Date  . ABDOMINAL HYSTERECTOMY    . CATARACT EXTRACTION W/PHACO Right 07/12/2020   Procedure: CATARACT EXTRACTION PHACO AND INTRAOCULAR LENS PLACEMENT ([place]) RIGHT;  Surgeon: Birder Robson, MD;  Location: [facility];  Service: Ophthalmology;  Laterality: Right;  7.35 0:46.4  . CATARACT EXTRACTION W/PHACO Left 07/31/2020   Procedure: CATARACT EXTRACTION PHACO AND INTRAOCULAR LENS PLACEMENT ([place]) LEFT;  Surgeon: Birder Robson, MD;  Location: [facility];  Service: Ophthalmology;  Laterality: Left;  6.27 0:36.5  . CERVICAL DISCECTOMY  1992   2 times  . CESAREAN SECTION     2 times  . COLONOSCOPY  2016    There were no vitals filed for this visit.   Subjective Assessment - 01/23/21 1235    Subjective  Pt reports painis better    Pertinent History DeQuervains Lt hand approx 4 months ago. PMH: OA, HTN, essential tremor    Special Tests + Finklesteins    Currently in Pain? Yes    Pain Score 4     Pain Location Wrist    Pain Orientation Left    Pain Descriptors / Indicators Sharp    Pain Type Acute pain    Pain Onset More than a month ago     Pain Frequency Intermittent    Aggravating Factors  radial thumb abduction    Pain Relieving Factors rest ionto                 OT Treatments/Exercises (OP) - 01/23/21     Wrist Exercises   Other wrist exercises Reviewed previously issued  A/ROM HEP for wrist and thumb motion   Other wrist exercises Pt issued isometric wrist HEP - see pt instructions for details.      Iontophoresis   Type of Iontophoresis Dexamethasone    Location Base of thumb (radially)    Dose 2.0 mA, 4 mg/mL (dose #5)   Time 20 minutes on + 5 minutes set up = 25 minutes total  No adverse reactions, pt has 1 small scab from previous ionto treatment that is healing.                               OT Short Term Goals - 01/21/21 1343      OT SHORT TERM GOAL #1   Title Independent with initial HEP (wrist movement seperate from thumb)    Time 3    Period Weeks  Status Achieved      OT SHORT TERM GOAL #2   Title Pt to verbalize understanding with task modifications, potential A/E needs, and brace/splints that will increase independence and decrease pain    Time 3    Period Weeks    Status Achieved      OT SHORT TERM GOAL #3   Title Pain will consistently be 5/10 or under    Time 3    Period Weeks    Status On-going      OT SHORT TERM GOAL #4   Title Independent w/ splint wear and care prn    Time 3    Period Weeks    Status Achieved             OT Long Term Goals - 01/02/21 1329      OT LONG TERM GOAL #1   Title Independent with updated HEP    Time 6    Period Weeks    Status New      OT LONG TERM GOAL #2   Title Grip strength Lt hand to be 38 lbs or greater    Baseline 32 lbs (Rt = 45 lbs)    Time 6    Period Weeks    Status New      OT LONG TERM GOAL #3   Title Lateral pinch Lt hand to be 12 lbs or greater    Baseline 10 lbs (Rt = 14 lbs)    Time 6    Period Weeks    Status New                 Plan - 01/23/21 1239    Clinical  Impression Statement Pt reports her pain is improved overall. Pt was seen for iotontophoresis treatment #5 today.    OT Occupational Profile and History Problem Focused Assessment - Including review of records relating to presenting problem    Occupational performance deficits (Please refer to evaluation for details): IADL's;ADL's;Social Participation;Leisure    Body Structure / Function / Physical Skills ADL;Pain;Strength;Edema;UE functional use;ROM;IADL;Coordination    Rehab Potential Good    Clinical Decision Making Limited treatment options, no task modification necessary    Comorbidities Affecting Occupational Performance: May have comorbidities impacting occupational performance    Modification or Assistance to Complete Evaluation  No modification of tasks or assist necessary to complete eval    OT Frequency 2x / week    OT Duration 6 weeks   plus eval   OT Treatment/Interventions Self-care/ADL training;Moist Heat;Fluidtherapy;DME and/or AE instruction;Splinting;Therapeutic activities;Ultrasound;Therapeutic exercise;Cryotherapy;Iontophoresis;Passive range of motion;Paraffin;Manual Therapy;Patient/family education    Plan continue with iontophoresis dose #6, continue isometrics and light resistive activity    Consulted and Agree with Plan of Care Patient           Patient will benefit from skilled therapeutic intervention in order to improve the following deficits and impairments:   Body Structure / Function / Physical Skills: ADL,Pain,Strength,Edema,UE functional use,ROM,IADL,Coordination       Visit Diagnosis: Pain in left wrist  Pain in thumb joint with movement of left hand  Muscle weakness (generalized)    Problem List Patient Active Problem List   Diagnosis Date Noted  . Left thigh pain 11/29/2019  . 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 Family history of colon cancer 05/21/2015  . Advance directive discussed with patient 11/21/2014  . Palpitations 05/16/2014  . Essential  tremor   . Routine general medical examination at a health care facility 10/07/2011  . IMPAIRED FASTING GLUCOSE  08/25/2008  . Essential hypertension, benign 03/16/2007  . Generalized osteoarthrosis, involving multiple sites 03/16/2007    Zach Tietje 01/23/2021, 1:04 PM Theone Murdoch, OTR/L [phone] Phone: [phone] 1:07 PM 01/23/21 [facility] [address] [place], [place], [address] Phone: [phone]   Fax:  [phone]  Name: Kathy Acosta MRN: [id number] Date of Birth: 1946-05-06

## 2021-01-28 ENCOUNTER — Other Ambulatory Visit: Payer: Self-pay

## 2021-01-28 ENCOUNTER — Ambulatory Visit: Payer: Medicare Other | Admitting: Occupational Therapy

## 2021-01-28 DIAGNOSIS — M25542 Pain in joints of left hand: Secondary | ICD-10-CM | POA: Diagnosis not present

## 2021-01-28 DIAGNOSIS — M25532 Pain in left wrist: Secondary | ICD-10-CM | POA: Diagnosis not present

## 2021-01-28 DIAGNOSIS — M6281 Muscle weakness (generalized): Secondary | ICD-10-CM | POA: Diagnosis not present

## 2021-01-28 NOTE — Therapy (Signed)
[facility] [address] [place], [place], [address] Phone: [phone]   Fax:  [phone]  Occupational Therapy Treatment  Patient Details  Name: Kathy Acosta MRN: [id number] Date of Birth: 1946-07-11 Referring Provider (OT): Dr. Frederico Hamman Copland   Encounter Date: 01/28/2021   OT End of Session - 01/28/21 1236    Visit Number 7    Number of Visits 13    Date for OT Re-Evaluation 02/20/21    Authorization Type UHC MCR    Progress Note Due on Visit 10    OT Start Time 1230    OT Stop Time 1315    OT Time Calculation (min) 45 min    Activity Tolerance Patient tolerated treatment well    Behavior During Therapy [facility] for tasks assessed/performed           Past Medical History:  Diagnosis Date  . Arthritis   . Cataract    bilateral repair  . Essential tremor    last 2 years  . Hx of colonic polyps   . Hypertension   . Impaired fasting glucose     Past Surgical History:  Procedure Laterality Date  . ABDOMINAL HYSTERECTOMY    . CATARACT EXTRACTION W/PHACO Right 07/12/2020   Procedure: CATARACT EXTRACTION PHACO AND INTRAOCULAR LENS PLACEMENT ([place]) RIGHT;  Surgeon: Birder Robson, MD;  Location: [facility];  Service: Ophthalmology;  Laterality: Right;  7.35 0:46.4  . CATARACT EXTRACTION W/PHACO Left 07/31/2020   Procedure: CATARACT EXTRACTION PHACO AND INTRAOCULAR LENS PLACEMENT ([employer]) LEFT;  Surgeon: Birder Robson, MD;  Location: [facility];  Service: Ophthalmology;  Laterality: Left;  6.27 0:36.5  . CERVICAL DISCECTOMY  1992   2 times  . CESAREAN SECTION     2 times  . COLONOSCOPY  2016    There were no vitals filed for this visit.   Subjective Assessment - 01/28/21 1234    Subjective  Pt reports painis better    Pertinent History DeQuervains Lt hand approx 4 months ago. PMH: OA, HTN, essential tremor    Special Tests + Finklesteins    Currently in Pain? Yes    Pain Score 4      Pain Location Wrist    Pain Orientation Left    Pain Descriptors / Indicators Sharp    Pain Type Acute pain    Pain Onset More than a month ago    Pain Frequency Intermittent    Aggravating Factors  radial thumb abduction    Pain Relieving Factors rest, ionto                        OT Treatments/Exercises (OP) - 01/28/21 0001      Exercises   Exercises Hand      Hand Exercises   Other Hand Exercises Gripper set at level 1 to pick up 1/2 blocks Lt hand with 1 rest break    Other Hand Exercises Clothespin activity for pinch strength - yellow to red resistance - to place on antenna and remove      Iontophoresis   Type of Iontophoresis Dexamethasone    Location Base of thumb (radially)    Dose 2.0 mA, 4 mg/mL - dose #6    Time 20 minutes on + 5 minutes set up = 25 minutes total                    OT Short Term Goals - 01/21/21 1343  OT SHORT TERM GOAL #1   Title Independent with initial HEP (wrist movement seperate from thumb)    Time 3    Period Weeks    Status Achieved      OT SHORT TERM GOAL #2   Title Pt to verbalize understanding with task modifications, potential A/E needs, and brace/splints that will increase independence and decrease pain    Time 3    Period Weeks    Status Achieved      OT SHORT TERM GOAL #3   Title Pain will consistently be 5/10 or under    Time 3    Period Weeks    Status On-going      OT SHORT TERM GOAL #4   Title Independent w/ splint wear and care prn    Time 3    Period Weeks    Status Achieved             OT Long Term Goals - 01/02/21 1329      OT LONG TERM GOAL #1   Title Independent with updated HEP    Time 6    Period Weeks    Status New      OT LONG TERM GOAL #2   Title Grip strength Lt hand to be 38 lbs or greater    Baseline 32 lbs (Rt = 45 lbs)    Time 6    Period Weeks    Status New      OT LONG TERM GOAL #3   Title Lateral pinch Lt hand to be 12 lbs or greater    Baseline  10 lbs (Rt = 14 lbs)    Time 6    Period Weeks    Status New                 Plan - 01/28/21 1517    Clinical Impression Statement Pt reports her pain is improved overall, but slightly increased w/ gripper activity. Pt finished last ionto treatment today    OT Occupational Profile and History Problem Focused Assessment - Including review of records relating to presenting problem    Occupational performance deficits (Please refer to evaluation for details): IADL's;ADL's;Social Participation;Leisure    Body Structure / Function / Physical Skills ADL;Pain;Strength;Edema;UE functional use;ROM;IADL;Coordination    Rehab Potential Good    Clinical Decision Making Limited treatment options, no task modification necessary    Comorbidities Affecting Occupational Performance: May have comorbidities impacting occupational performance    Modification or Assistance to Complete Evaluation  No modification of tasks or assist necessary to complete eval    OT Frequency 2x / week    OT Duration 6 weeks   plus eval   OT Treatment/Interventions Self-care/ADL training;Moist Heat;Fluidtherapy;DME and/or AE instruction;Splinting;Therapeutic activities;Ultrasound;Therapeutic exercise;Cryotherapy;Iontophoresis;Passive range of motion;Paraffin;Manual Therapy;Patient/family education    Plan continue with light strengthening as tolerated, ice at end of session    Consulted and Agree with Plan of Care Patient           Patient will benefit from skilled therapeutic intervention in order to improve the following deficits and impairments:   Body Structure / Function / Physical Skills: ADL,Pain,Strength,Edema,UE functional use,ROM,IADL,Coordination       Visit Diagnosis: Pain in left wrist  Pain in thumb joint with movement of left hand    Problem List Patient Active Problem List   Diagnosis Date Noted  . Left thigh pain 11/29/2019  . 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 Family history of colon cancer  05/21/2015  . Advance directive  discussed with patient 11/21/2014  . Palpitations 05/16/2014  . Essential tremor   . Routine general medical examination at a health care facility 10/07/2011  . IMPAIRED FASTING GLUCOSE 08/25/2008  . Essential hypertension, benign 03/16/2007  . Generalized osteoarthrosis, involving multiple sites 03/16/2007    Carey Bullocks, OTR/L 01/28/2021, 3:20 PM  [facility] [address] [place], [place], [address] Phone: [phone]   Fax:  [phone]  Name: Kathy Acosta MRN: [id number] Date of Birth: [birth year]

## 2021-01-30 ENCOUNTER — Other Ambulatory Visit: Payer: Self-pay

## 2021-01-30 ENCOUNTER — Ambulatory Visit: Payer: Medicare Other | Admitting: Occupational Therapy

## 2021-01-30 ENCOUNTER — Encounter: Payer: Self-pay | Admitting: Occupational Therapy

## 2021-01-30 DIAGNOSIS — M25542 Pain in joints of left hand: Secondary | ICD-10-CM

## 2021-01-30 DIAGNOSIS — M6281 Muscle weakness (generalized): Secondary | ICD-10-CM | POA: Diagnosis not present

## 2021-01-30 DIAGNOSIS — M25532 Pain in left wrist: Secondary | ICD-10-CM

## 2021-01-30 NOTE — Patient Instructions (Signed)
1. Grip Strengthening (Resistive Putty)   Squeeze putty using thumb and all fingers. Repeat _10-20___ times. Do __1__ sessions per day.   2. Roll putty into tube on table and pinch between each finger and thumb x 10 reps each. (can do ring and small finger together)     Copyright  [employer]. All rights reserved.

## 2021-01-30 NOTE — Therapy (Signed)
[facility] [address] [place], [place], [address] Phone: [phone]   Fax:  [phone]  Occupational Therapy Treatment  Patient Details  Name: Kathy Acosta MRN: [id number] Date of Birth: [birth year] Referring Provider (OT): Dr. Frederico Hamman Copland   Encounter Date: 01/30/2021   OT End of Session - 01/30/21 1251    Visit Number 8    Number of Visits 13    Date for OT Re-Evaluation 02/20/21    Authorization Type UHC MCR    OT Start Time 9622    OT Stop Time 1315    OT Time Calculation (min) 40 min           Past Medical History:  Diagnosis Date  . Arthritis   . Cataract    bilateral repair  . Essential tremor    last 2 years  . Hx of colonic polyps   . Hypertension   . Impaired fasting glucose     Past Surgical History:  Procedure Laterality Date  . ABDOMINAL HYSTERECTOMY    . CATARACT EXTRACTION W/PHACO Right 07/12/2020   Procedure: CATARACT EXTRACTION PHACO AND INTRAOCULAR LENS PLACEMENT ([facility]) RIGHT;  Surgeon: Birder Robson, MD;  Location: [facility];  Service: Ophthalmology;  Laterality: Right;  7.35 0:46.4  . CATARACT EXTRACTION W/PHACO Left 07/31/2020   Procedure: CATARACT EXTRACTION PHACO AND INTRAOCULAR LENS PLACEMENT ([facility]) LEFT;  Surgeon: Birder Robson, MD;  Location: [facility];  Service: Ophthalmology;  Laterality: Left;  6.27 0:36.5  . CERVICAL DISCECTOMY  1992   2 times  . CESAREAN SECTION     2 times  . COLONOSCOPY  2016    There were no vitals filed for this visit.   Subjective Assessment - 01/30/21 1309    Subjective  Pt reports mild wrist pain today    Pertinent History DeQuervains Lt hand approx 4 months ago. PMH: OA, HTN, essential tremor    Special Tests + Finklesteins    Currently in Pain? Yes    Pain Score 3     Pain Location Wrist    Pain Orientation Left    Pain Descriptors / Indicators Sharp    Pain Type Acute pain    Pain Onset More than a  month ago    Pain Frequency Intermittent    Aggravating Factors  wrist and thumb movement    Pain Relieving Factors rest, ionto             Treatment:US 3 mhz, 0.8 w/cm 2, 20% x 8 mins, to left radial wrist , no adverse reactions. Reviewed previously issued HEP's  and added putty exercises, min v.c . Ice pack x 8 mins to left wrist and hand, no adverse reactions.  Pt was pain free at end of session.                   OT Education - 01/30/21 1306    Education Details Reviewed A/ROM wrist and thumb exercises from HEP, reveiwed isometric HEP, issued yellow theraputty HEP.    Person(s) Educated Patient    Methods Explanation;Demonstration;Handout    Comprehension Verbalized understanding;Returned demonstration;Verbal cues required            OT Short Term Goals - 01/30/21 1302      OT SHORT TERM GOAL #1   Title Independent with initial HEP (wrist movement seperate from thumb)    Time 3    Period Weeks    Status Achieved      OT SHORT  TERM GOAL #2   Title Pt to verbalize understanding with task modifications, potential A/E needs, and brace/splints that will increase independence and decrease pain    Time 3    Period Weeks    Status Achieved      OT SHORT TERM GOAL #3   Title Pain will consistently be 5/10 or under    Time 3    Period Weeks    Status Achieved      OT SHORT TERM GOAL #4   Title Independent w/ splint wear and care prn    Time 3    Period Weeks    Status Achieved             OT Long Term Goals - 01/30/21 1302      OT LONG TERM GOAL #1   Title Independent with updated HEP    Time 6    Period Weeks    Status On-going      OT LONG TERM GOAL #2   Title Grip strength Lt hand to be 38 lbs or greater    Baseline 32 lbs (Rt = 45 lbs)    Time 6    Period Weeks    Status On-going      OT LONG TERM GOAL #3   Title Lateral pinch Lt hand to be 12 lbs or greater    Baseline 10 lbs (Rt = 14 lbs)    Time 6    Period Weeks    Status  New                 Plan - 01/30/21 1300    Clinical Impression Statement Pt reports her pain is improved overall. Pt was issued gentle putty HEP.    OT Occupational Profile and History Problem Focused Assessment - Including review of records relating to presenting problem    Occupational performance deficits (Please refer to evaluation for details): IADL's;ADL's;Social Participation;Leisure    Body Structure / Function / Physical Skills ADL;Pain;Strength;Edema;UE functional use;ROM;IADL;Coordination    Rehab Potential Good    Clinical Decision Making Limited treatment options, no task modification necessary    Comorbidities Affecting Occupational Performance: May have comorbidities impacting occupational performance    Modification or Assistance to Complete Evaluation  No modification of tasks or assist necessary to complete eval    OT Frequency 2x / week    OT Duration 6 weeks   plus eval   OT Treatment/Interventions Self-care/ADL training;Moist Heat;Fluidtherapy;DME and/or AE instruction;Splinting;Therapeutic activities;Ultrasound;Therapeutic exercise;Cryotherapy;Iontophoresis;Passive range of motion;Paraffin;Manual Therapy;Patient/family education    Plan continue with light strengthening as tolerated, ice at end of session    Consulted and Agree with Plan of Care Patient           Patient will benefit from skilled therapeutic intervention in order to improve the following deficits and impairments:   Body Structure / Function / Physical Skills: ADL,Pain,Strength,Edema,UE functional use,ROM,IADL,Coordination       Visit Diagnosis: Pain in left wrist  Pain in thumb joint with movement of left hand  Muscle weakness (generalized)    Problem List Patient Active Problem List   Diagnosis Date Noted  . Left thigh pain 11/29/2019  . 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 Family history of colon cancer 05/21/2015  . Advance directive discussed with patient 11/21/2014  .  Palpitations 05/16/2014  . Essential tremor   . Routine general medical examination at a health care facility 10/07/2011  . IMPAIRED FASTING GLUCOSE 08/25/2008  . Essential hypertension, benign 03/16/2007  . Generalized osteoarthrosis,  involving multiple sites 03/16/2007    [name] 01/30/2021, 1:10 PM Theone Murdoch, OTR/L [phone] Phone: [phone] 1:14 PM 03/30/22[facility] [address] [place], [place], [address] Phone: [phone]   Fax:  [phone]  Name: Kathy Acosta MRN: [id number] Date of Birth: 1946/07/24

## 2021-02-04 ENCOUNTER — Ambulatory Visit: Payer: Medicare Other | Admitting: Occupational Therapy

## 2021-02-06 ENCOUNTER — Other Ambulatory Visit: Payer: Self-pay

## 2021-02-06 ENCOUNTER — Ambulatory Visit: Payer: Medicare Other | Attending: Family Medicine | Admitting: Occupational Therapy

## 2021-02-06 DIAGNOSIS — M25532 Pain in left wrist: Secondary | ICD-10-CM

## 2021-02-06 DIAGNOSIS — M25542 Pain in joints of left hand: Secondary | ICD-10-CM | POA: Diagnosis not present

## 2021-02-06 DIAGNOSIS — M6281 Muscle weakness (generalized): Secondary | ICD-10-CM

## 2021-02-06 NOTE — Patient Instructions (Signed)
Lateral Pinch Strengthening (Resistive Putty)    Squeeze between thumb and side of index finger and turn. "Paw print" Repeat _10___ times. Do __2__ sessions per day.

## 2021-02-06 NOTE — Therapy (Signed)
[facility] [address] [place], [place], [address] Phone: [phone]   Fax:  [phone]  Occupational Therapy Treatment  Patient Details  Name: Kathy Acosta MRN: [id number] Date of Birth: 1946/01/10 Referring Provider (OT): Dr. Frederico Hamman Copland   Encounter Date: 02/06/2021   OT End of Session - 02/06/21 1351    Visit Number 9    Number of Visits 13    Date for OT Re-Evaluation 02/20/21    Authorization Type UHC MCR    OT Start Time 1315    OT Stop Time 1400   last 10 min applied ice - unbillable   OT Time Calculation (min) 45 min    Activity Tolerance Patient tolerated treatment well    Behavior During Therapy [facility] for tasks assessed/performed           Past Medical History:  Diagnosis Date  . Arthritis   . Cataract    bilateral repair  . Essential tremor    last 2 years  . Hx of colonic polyps   . Hypertension   . Impaired fasting glucose     Past Surgical History:  Procedure Laterality Date  . ABDOMINAL HYSTERECTOMY    . CATARACT EXTRACTION W/PHACO Right 07/12/2020   Procedure: CATARACT EXTRACTION PHACO AND INTRAOCULAR LENS PLACEMENT ([place]) RIGHT;  Surgeon: Birder Robson, MD;  Location: [facility];  Service: Ophthalmology;  Laterality: Right;  7.35 0:46.4  . CATARACT EXTRACTION W/PHACO Left 07/31/2020   Procedure: CATARACT EXTRACTION PHACO AND INTRAOCULAR LENS PLACEMENT ([place]) LEFT;  Surgeon: Birder Robson, MD;  [facility];  Service: Ophthalmology;  Laterality: Left;  6.27 0:36.5  . CERVICAL DISCECTOMY  1992   2 times  . CESAREAN SECTION     2 times  . COLONOSCOPY  2016    There were no vitals filed for this visit.    Ultrasound x 8 minutes along radial wrist at 3 Mhz, 20% pulsed, 0.8 wts/cm2 Reviewed putty HEP and cued for correct positioning of thumb for pinch strength. Added lateral pinch strength to putty HEP and extensively reviewed. Clothespin activity  for pinch strength (yellow to red resistance) .  Ice applied to radial wrist/foream last 10 min of session                     OT Education - 02/06/21 1341    Education Details Lateral pinch putty ex    Person(s) Educated Patient    Methods Explanation;Demonstration;Handout    Comprehension Verbalized understanding;Returned demonstration            OT Short Term Goals - 01/30/21 1302      OT SHORT TERM GOAL #1   Title Independent with initial HEP (wrist movement seperate from thumb)    Time 3    Period Weeks    Status Achieved      OT SHORT TERM GOAL #2   Title Pt to verbalize understanding with task modifications, potential A/E needs, and brace/splints that will increase independence and decrease pain    Time 3    Period Weeks    Status Achieved      OT SHORT TERM GOAL #3   Title Pain will consistently be 5/10 or under    Time 3    Period Weeks    Status Achieved      OT SHORT TERM GOAL #4   Title Independent w/ splint wear and care prn    Time 3    Period  Weeks    Status Achieved             OT Long Term Goals - 01/30/21 1302      OT LONG TERM GOAL #1   Title Independent with updated HEP    Time 6    Period Weeks    Status On-going      OT LONG TERM GOAL #2   Title Grip strength Lt hand to be 38 lbs or greater    Baseline 32 lbs (Rt = 45 lbs)    Time 6    Period Weeks    Status On-going      OT LONG TERM GOAL #3   Title Lateral pinch Lt hand to be 12 lbs or greater    Baseline 10 lbs (Rt = 14 lbs)    Time 6    Period Weeks    Status New                 Plan - 02/06/21 1352    Clinical Impression Statement Pt progressing with pinch strengthening and wrist strengthening. Pain overall much better    OT Occupational Profile and History Problem Focused Assessment - Including review of records relating to presenting problem    Occupational performance deficits (Please refer to evaluation for details): IADL's;ADL's;Social  Participation;Leisure    Body Structure / Function / Physical Skills ADL;Pain;Strength;Edema;UE functional use;ROM;IADL;Coordination    Rehab Potential Good    Clinical Decision Making Limited treatment options, no task modification necessary    Comorbidities Affecting Occupational Performance: May have comorbidities impacting occupational performance    Modification or Assistance to Complete Evaluation  No modification of tasks or assist necessary to complete eval    OT Frequency 2x / week    OT Duration 6 weeks   plus eval   OT Treatment/Interventions Self-care/ADL training;Moist Heat;Fluidtherapy;DME and/or AE instruction;Splinting;Therapeutic activities;Ultrasound;Therapeutic exercise;Cryotherapy;Iontophoresis;Passive range of motion;Paraffin;Manual Therapy;Patient/family education    Plan continue with light strengthening as tolerated, ice at end of session    Consulted and Agree with Plan of Care Patient           Patient will benefit from skilled therapeutic intervention in order to improve the following deficits and impairments:   Body Structure / Function / Physical Skills: ADL,Pain,Strength,Edema,UE functional use,ROM,IADL,Coordination       Visit Diagnosis: Pain in left wrist  Pain in thumb joint with movement of left hand  Muscle weakness (generalized)    Problem List Patient Active Problem List   Diagnosis Date Noted  . Left thigh pain 11/29/2019  . 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 Family history of colon cancer 05/21/2015  . Advance directive discussed with patient 11/21/2014  . Palpitations 05/16/2014  . Essential tremor   . Routine general medical examination at a health care facility 10/07/2011  . IMPAIRED FASTING GLUCOSE 08/25/2008  . Essential hypertension, benign 03/16/2007  . Generalized osteoarthrosis, involving multiple sites 03/16/2007    Carey Bullocks, OTR/L 02/06/2021, 2:54 PM  [facility] [address] [place], [place], [address] Phone: [phone]   Fax:  [phone]  Name: Kathy Acosta MRN: [id number] Date of Birth: [birth year]

## 2021-02-11 ENCOUNTER — Ambulatory Visit: Payer: Medicare Other | Admitting: Occupational Therapy

## 2021-02-11 ENCOUNTER — Other Ambulatory Visit: Payer: Self-pay

## 2021-02-11 DIAGNOSIS — M6281 Muscle weakness (generalized): Secondary | ICD-10-CM | POA: Diagnosis not present

## 2021-02-11 DIAGNOSIS — M25542 Pain in joints of left hand: Secondary | ICD-10-CM

## 2021-02-11 DIAGNOSIS — M25532 Pain in left wrist: Secondary | ICD-10-CM | POA: Diagnosis not present

## 2021-02-11 NOTE — Therapy (Signed)
[facility] [address] [place], [place], [address] Phone: [phone]   Fax:  [phone]  Occupational Therapy Treatment  Patient Details  Name: Kathy Acosta MRN: [id number] Date of Birth: 23-Jun-1946 Referring Provider (OT): Dr. Frederico Hamman Copland   Encounter Date: 02/11/2021   OT End of Session - 02/11/21 1240    Visit Number 10    Number of Visits 13    Date for OT Re-Evaluation 02/20/21    Authorization Type UHC MCR    Progress Note Due on Visit 10    OT Start Time 1230    OT Stop Time 1315   10 min of ice - unbillable   OT Time Calculation (min) 45 min    Activity Tolerance Patient tolerated treatment well    Behavior During Therapy [facility] for tasks assessed/performed           Past Medical History:  Diagnosis Date  . Arthritis   . Cataract    bilateral repair  . Essential tremor    last 2 years  . Hx of colonic polyps   . Hypertension   . Impaired fasting glucose     Past Surgical History:  Procedure Laterality Date  . ABDOMINAL HYSTERECTOMY    . CATARACT EXTRACTION W/PHACO Right 07/12/2020   Procedure: CATARACT EXTRACTION PHACO AND INTRAOCULAR LENS PLACEMENT ([place]) RIGHT;  Surgeon: Birder Robson, MD;  Location: [facility];  Service: Ophthalmology;  Laterality: Right;  7.35 0:46.4  . CATARACT EXTRACTION W/PHACO Left 07/31/2020   Procedure: CATARACT EXTRACTION PHACO AND INTRAOCULAR LENS PLACEMENT ([place]) LEFT;  Surgeon: Birder Robson, MD;  Location: [facility];  Service: Ophthalmology;  Laterality: Left;  6.27 0:36.5  . CERVICAL DISCECTOMY  1992   2 times  . CESAREAN SECTION     2 times  . COLONOSCOPY  2016    There were no vitals filed for this visit.   Subjective Assessment - 02/11/21 1235    Subjective  Pt reports mild wrist pain today    Pertinent History DeQuervains Lt hand approx 4 months ago. PMH: OA, HTN, essential tremor    Special Tests + Finklesteins     Currently in Pain? Yes    Pain Score 3     Pain Location Wrist    Pain Orientation Left    Pain Descriptors / Indicators Sharp    Pain Type Acute pain    Pain Onset More than a month ago    Pain Frequency Intermittent    Aggravating Factors  wrist and thumb movement    Pain Relieving Factors rest, ice           Reviewed putty HEP for correct thumb positioning. Issued strengthening HEP for wrist - see pt instructions. Pt told to d/c if pain increases, otherwise to do every other day. Pt agrees.  Assessed goals and progress to date - see goal section.  Ice to radial wrist/base of thumb x 10 min at end of session                      OT Education - 02/11/21 1300    Education Details wrist strengthening HEP (Pt instructed to alternate b/t isometric and dumbbells every other day)    Person(s) Educated Patient    Methods Explanation;Demonstration;Handout;Verbal cues    Comprehension Verbalized understanding;Returned demonstration            OT Short Term Goals - 01/30/21 1302      OT  SHORT TERM GOAL #1   Title Independent with initial HEP (wrist movement seperate from thumb)    Time 3    Period Weeks    Status Achieved      OT SHORT TERM GOAL #2   Title Pt to verbalize understanding with task modifications, potential A/E needs, and brace/splints that will increase independence and decrease pain    Time 3    Period Weeks    Status Achieved      OT SHORT TERM GOAL #3   Title Pain will consistently be 5/10 or under    Time 3    Period Weeks    Status Achieved      OT SHORT TERM GOAL #4   Title Independent w/ splint wear and care prn    Time 3    Period Weeks    Status Achieved             OT Long Term Goals - 02/11/21 1255      OT LONG TERM GOAL #1   Title Independent with updated HEP    Time 6    Period Weeks    Status Achieved      OT LONG TERM GOAL #2   Title Grip strength Lt hand to be 38 lbs or greater    Baseline 32 lbs (Rt = 45  lbs)    Time 6    Period Weeks    Status Achieved   39 lbs average     OT LONG TERM GOAL #3   Title Lateral pinch Lt hand to be 12 lbs or greater    Baseline 10 lbs (Rt = 14 lbs)    Time 6    Period Weeks    Status On-going   10 lbs                Plan - 02/11/21 1308    Clinical Impression Statement This 10th progress note is from 01/02/21 to 02/11/21: Pt has improved significantly in pain and has met all STG's and 2/3 LTG's at this time. Pt has improved function and grip strength as well.    Occupational performance deficits (Please refer to evaluation for details): IADL's;ADL's;Social Participation;Leisure    Body Structure / Function / Physical Skills ADL;Pain;Strength;Edema;UE functional use;ROM;IADL;Coordination    Rehab Potential Good    OT Frequency 2x / week    OT Duration 6 weeks    OT Treatment/Interventions Self-care/ADL training;Moist Heat;Fluidtherapy;DME and/or AE instruction;Splinting;Therapeutic activities;Ultrasound;Therapeutic exercise;Cryotherapy;Iontophoresis;Passive range of motion;Paraffin;Manual Therapy;Patient/family education    Plan continue strengthening, reassess pinch strength, d/c next session    Consulted and Agree with Plan of Care Patient           Patient will benefit from skilled therapeutic intervention in order to improve the following deficits and impairments:   Body Structure / Function / Physical Skills: ADL,Pain,Strength,Edema,UE functional use,ROM,IADL,Coordination       Visit Diagnosis: Pain in left wrist  Pain in thumb joint with movement of left hand  Muscle weakness (generalized)    Problem List Patient Active Problem List   Diagnosis Date Noted  . Left thigh pain 11/29/2019  . 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 Family history of colon cancer 05/21/2015  . Advance directive discussed with patient 11/21/2014  . Palpitations 05/16/2014  . Essential tremor   . Routine general medical examination at a health care facility  10/07/2011  . IMPAIRED FASTING GLUCOSE 08/25/2008  . Essential hypertension, benign 03/16/2007  . Generalized osteoarthrosis, involving multiple sites 03/16/2007  Carey Bullocks, OTR/L 02/11/2021, 3:03 PM  [facility] [address] [place], [place], [address] Phone: [phone]   Fax:  [phone]  Name: ALESHKA CORNEY MRN: [id number] Date of Birth: 1946-09-27

## 2021-02-11 NOTE — Patient Instructions (Signed)
Wrist Extension: Resisted    With left palm down, __1__ pound weight in hand, bend wrist up. Return slowly. Repeat _10___ times per set.  Do _2___ sessions per day, every other day.  Wrist Radial Deviation: Resisted    With left thumb up, _1___ pound weight in hand, bend wrist up. Return slowly. Repeat __10__ times per set. Do __2__ sessions per day, every other day.  Flexion (Resistive)    With hand palm-up and holding 1 pound weight, bend hand toward you at wrist.Relax slowly. Repeat _10___ times. Do __2__ sessions per day, every other day.

## 2021-02-13 ENCOUNTER — Other Ambulatory Visit: Payer: Self-pay

## 2021-02-13 ENCOUNTER — Ambulatory Visit: Payer: Medicare Other | Admitting: Occupational Therapy

## 2021-02-13 DIAGNOSIS — M25542 Pain in joints of left hand: Secondary | ICD-10-CM | POA: Diagnosis not present

## 2021-02-13 DIAGNOSIS — M6281 Muscle weakness (generalized): Secondary | ICD-10-CM | POA: Diagnosis not present

## 2021-02-13 DIAGNOSIS — M25532 Pain in left wrist: Secondary | ICD-10-CM

## 2021-02-13 NOTE — Therapy (Signed)
[facility] [place], [place], [address] Phone: [phone]   Fax:  [phone]  Occupational Therapy Treatment  Patient Details  Name: Kathy Acosta MRN: [id number] Date of Birth: 04/06/1946 Referring Provider (OT): Dr. Frederico Hamman Copland   Encounter Date: 02/13/2021   OT End of Session - 02/13/21 1323    Visit Number 11    Number of Visits 13    Date for OT Re-Evaluation 02/20/21    Authorization Type UHC MCR    OT Start Time 2409    OT Stop Time 1400    OT Time Calculation (min) 43 min    Activity Tolerance Patient tolerated treatment well           Past Medical History:  Diagnosis Date  . Arthritis   . Cataract    bilateral repair  . Essential tremor    last 2 years  . Hx of colonic polyps   . Hypertension   . Impaired fasting glucose     Past Surgical History:  Procedure Laterality Date  . ABDOMINAL HYSTERECTOMY    . CATARACT EXTRACTION W/PHACO Right 07/12/2020   Procedure: CATARACT EXTRACTION PHACO AND INTRAOCULAR LENS PLACEMENT ([place]) RIGHT;  Surgeon: Birder Robson, MD;  Location: [facility];  Service: Ophthalmology;  Laterality: Right;  7.35 0:46.4  . CATARACT EXTRACTION W/PHACO Left 07/31/2020   Procedure: CATARACT EXTRACTION PHACO AND INTRAOCULAR LENS PLACEMENT ([place]) LEFT;  Surgeon: Birder Robson, MD;  Location: [facility];  Service: Ophthalmology;  Laterality: Left;  6.27 0:36.5  . CERVICAL DISCECTOMY  1992   2 times  . CESAREAN SECTION     2 times  . COLONOSCOPY  2016    There were no vitals filed for this visit.   Subjective Assessment - 02/13/21 1322    Subjective  Pt reports mild wrist pain today    Pertinent History DeQuervains Lt hand approx 4 months ago. PMH: OA, HTN, essential tremor    Special Tests + Finklesteins    Currently in Pain? Yes    Pain Score 2     Pain Location Wrist    Pain Orientation Left    Pain Descriptors / Indicators  Sharp    Pain Type Acute pain    Pain Onset More than a month ago    Pain Frequency Intermittent    Aggravating Factors  wrist and thumb movement    Pain Relieving Factors rest, ice           Pt reports overall symptoms/pain have decreased significantly. Pt still has very localized pain at base of wrist but mild at this point. Reviewed HEP's and how to adjust/grade ex's if pain does exacerbate in the future. Pt instructed to stop strengthening in RD if pain increases and do isometrics for this instead.  Ultrasound x 5 min. Pulsed 20%, 3 Mhz, 0.8 wts/cm2 along radial base of thumb.  Clothespin activity yellow to green resistance for lateral pinch strength.  Ice massage x 7 min. Along area at end of session                       OT Short Term Goals - 01/30/21 1302      OT SHORT TERM GOAL #1   Title Independent with initial HEP (wrist movement seperate from thumb)    Time 3    Period Weeks    Status Achieved      OT SHORT TERM GOAL #2   Title  Pt to verbalize understanding with task modifications, potential A/E needs, and brace/splints that will increase independence and decrease pain    Time 3    Period Weeks    Status Achieved      OT SHORT TERM GOAL #3   Title Pain will consistently be 5/10 or under    Time 3    Period Weeks    Status Achieved      OT SHORT TERM GOAL #4   Title Independent w/ splint wear and care prn    Time 3    Period Weeks    Status Achieved             OT Long Term Goals - 02/13/21 1408      OT LONG TERM GOAL #1   Title Independent with updated HEP    Time 6    Period Weeks    Status Achieved      OT LONG TERM GOAL #2   Title Grip strength Lt hand to be 38 lbs or greater    Baseline 32 lbs (Rt = 45 lbs)    Time 6    Period Weeks    Status Achieved   39 lbs average     OT LONG TERM GOAL #3   Title Lateral pinch Lt hand to be 12 lbs or greater    Baseline 10 lbs (Rt = 14 lbs)    Time 6    Period Weeks    Status  Not Met   10 lbs                Plan - 02/13/21 1408    Clinical Impression Statement Pt has improved significantly in pain and has met all STG's and 2/3 LTG's at this time. Pt has improved function and grip strength as well.    Occupational performance deficits (Please refer to evaluation for details): IADL's;ADL's;Social Participation;Leisure    Body Structure / Function / Physical Skills ADL;Pain;Strength;Edema;UE functional use;ROM;IADL;Coordination    Rehab Potential Good    OT Frequency 2x / week    OT Duration 6 weeks    OT Treatment/Interventions Self-care/ADL training;Moist Heat;Fluidtherapy;DME and/or AE instruction;Splinting;Therapeutic activities;Ultrasound;Therapeutic exercise;Cryotherapy;Iontophoresis;Passive range of motion;Paraffin;Manual Therapy;Patient/family education    Plan D/C O.T.    Consulted and Agree with Plan of Care Patient           Patient will benefit from skilled therapeutic intervention in order to improve the following deficits and impairments:   Body Structure / Function / Physical Skills: ADL,Pain,Strength,Edema,UE functional use,ROM,IADL,Coordination       Visit Diagnosis: Pain in left wrist  Pain in thumb joint with movement of left hand  Muscle weakness (generalized)    Problem List Patient Active Problem List   Diagnosis Date Noted  . Left thigh pain 11/29/2019  . 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 Family history of colon cancer 05/21/2015  . Advance directive discussed with patient 11/21/2014  . Palpitations 05/16/2014  . Essential tremor   . Routine general medical examination at a health care facility 10/07/2011  . IMPAIRED FASTING GLUCOSE 08/25/2008  . Essential hypertension, benign 03/16/2007  . Generalized osteoarthrosis, involving multiple sites 03/16/2007   OCCUPATIONAL THERAPY DISCHARGE SUMMARY  Visits from Start of Care: 11  Current functional level related to goals / functional outcomes: See above   Remaining  deficits: Mild pain Mild deficits in strength (particularly pinch strength)   Education / Equipment: Graded HEP's, brace wear/care, pain management strategies including task modifications/adaptations and potential A/E needs.  Plan: Patient agrees to discharge.  Patient goals were met. Patient is being discharged due to meeting the stated rehab goals.  ?????        Carey Bullocks, OTR/L 02/13/2021, 2:10 PM  [facility] [address] [place], [place], [address] Phone: [phone]   Fax:  [phone]  Name: ENSLEE BIBBINS MRN: [id number] Date of Birth: 1946/09/16

## 2021-03-11 DIAGNOSIS — M3501 Sicca syndrome with keratoconjunctivitis: Secondary | ICD-10-CM | POA: Diagnosis not present

## 2021-07-15 ENCOUNTER — Other Ambulatory Visit: Payer: Self-pay | Admitting: Internal Medicine

## 2021-08-23 ENCOUNTER — Ambulatory Visit (INDEPENDENT_AMBULATORY_CARE_PROVIDER_SITE_OTHER): Payer: Medicare Other

## 2021-08-23 ENCOUNTER — Other Ambulatory Visit: Payer: Self-pay

## 2021-08-23 DIAGNOSIS — Z23 Encounter for immunization: Secondary | ICD-10-CM | POA: Diagnosis not present

## 2021-09-17 ENCOUNTER — Other Ambulatory Visit: Payer: Self-pay | Admitting: Internal Medicine

## 2021-09-17 DIAGNOSIS — Z1231 Encounter for screening mammogram for malignant neoplasm of breast: Secondary | ICD-10-CM

## 2021-11-07 ENCOUNTER — Other Ambulatory Visit: Payer: Self-pay

## 2021-11-07 ENCOUNTER — Ambulatory Visit
Admission: RE | Admit: 2021-11-07 | Discharge: 2021-11-07 | Disposition: A | Discharge status: Home / Self Care | Payer: Medicare Other | Source: Ambulatory Visit | Attending: Internal Medicine | Admitting: Internal Medicine

## 2021-11-07 DIAGNOSIS — Z1231 Encounter for screening mammogram for malignant neoplasm of breast: Secondary | ICD-10-CM | POA: Diagnosis not present

## 2021-12-03 ENCOUNTER — Encounter: Payer: Self-pay | Admitting: Internal Medicine

## 2021-12-03 ENCOUNTER — Other Ambulatory Visit: Payer: Self-pay

## 2021-12-03 ENCOUNTER — Ambulatory Visit (INDEPENDENT_AMBULATORY_CARE_PROVIDER_SITE_OTHER): Payer: Medicare Other | Admitting: Internal Medicine

## 2021-12-03 VITALS — BP 140/82 | HR 41 | Temp 97.3°F | Ht 61.5 in | Wt 165.0 lb

## 2021-12-03 DIAGNOSIS — G250 Essential tremor: Secondary | ICD-10-CM | POA: Diagnosis not present

## 2021-12-03 DIAGNOSIS — Z0000 Encounter for general adult medical examination without abnormal findings: Secondary | ICD-10-CM

## 2021-12-03 DIAGNOSIS — K2100 Gastro-esophageal reflux disease with esophagitis, without bleeding: Secondary | ICD-10-CM | POA: Diagnosis not present

## 2021-12-03 DIAGNOSIS — R0609 Other forms of dyspnea: Secondary | ICD-10-CM

## 2021-12-03 DIAGNOSIS — R7301 Impaired fasting glucose: Secondary | ICD-10-CM

## 2021-12-03 DIAGNOSIS — I10 Essential (primary) hypertension: Secondary | ICD-10-CM

## 2021-12-03 DIAGNOSIS — M159 Polyosteoarthritis, unspecified: Secondary | ICD-10-CM | POA: Diagnosis not present

## 2021-12-03 DIAGNOSIS — K219 Gastro-esophageal reflux disease without esophagitis: Secondary | ICD-10-CM | POA: Insufficient documentation

## 2021-12-03 LAB — LIPID PANEL
Cholesterol: 159 mg/dL (ref 0–200)
HDL: 43 mg/dL (ref >39.00)
LDL Cholesterol: 81 mg/dL (ref 0–99)
NonHDL: 115.56
Total CHOL/HDL Ratio: 4
Triglycerides: 175 mg/dL — ABNORMAL HIGH (ref 0.0–149.0)
VLDL: 35 mg/dL (ref 0.0–40.0)

## 2021-12-03 LAB — COMPREHENSIVE METABOLIC PANEL
ALT: 35 U/L (ref 0–35)
AST: 35 U/L (ref 0–37)
Albumin: 4.1 g/dL (ref 3.5–5.2)
Alkaline Phosphatase: 63 U/L (ref 39–117)
BUN: 24 mg/dL — ABNORMAL HIGH (ref 6–23)
CO2: 31 mEq/L (ref 19–32)
Calcium: 9.7 mg/dL (ref 8.4–10.5)
Chloride: 101 mEq/L (ref 96–112)
Creatinine, Ser: 0.79 mg/dL (ref 0.40–1.20)
GFR: 72.12 mL/min (ref >60.00)
Glucose, Bld: 133 mg/dL — ABNORMAL HIGH (ref 70–99)
Potassium: 3.6 mEq/L (ref 3.5–5.1)
Sodium: 140 mEq/L (ref 135–145)
Total Bilirubin: 0.5 mg/dL (ref 0.2–1.2)
Total Protein: 6.9 g/dL (ref 6.0–8.3)

## 2021-12-03 LAB — CBC
HCT: 38.7 % (ref 36.0–46.0)
Hemoglobin: 12.4 g/dL (ref 12.0–15.0)
MCHC: 31.9 g/dL (ref 30.0–36.0)
MCV: 85.5 fl (ref 78.0–100.0)
Platelets: 221 10*3/uL (ref 150.0–400.0)
RBC: 4.53 Mil/uL (ref 3.87–5.11)
RDW: 13.9 % (ref 11.5–15.5)
WBC: 5.3 10*3/uL (ref 4.0–10.5)

## 2021-12-03 LAB — T4, FREE: Free T4: 0.81 ng/dL (ref 0.60–1.60)

## 2021-12-03 LAB — HEMOGLOBIN A1C: Hgb A1c MFr Bld: 6.1 % (ref 4.6–6.5)

## 2021-12-03 MED ORDER — OMEPRAZOLE 20 MG PO CPDR: 20.0000 mg | DELAYED_RELEASE_CAPSULE | Freq: Every day | ORAL | 3 refills | Status: DC

## 2021-12-03 NOTE — Assessment & Plan Note (Signed)
BP Readings from Last 3 Encounters:  12/03/21 140/82  12/05/20 140/90  11/30/20 138/82   BP okay on amlodipine 10, valsartan 160 and HCTZ 12.5 daily Will check labs

## 2021-12-03 NOTE — Progress Notes (Signed)
Hearing Screening - Comments:: Passed Whisper test Vision Screening - Comments:: April 2022

## 2021-12-03 NOTE — Assessment & Plan Note (Signed)
Still mild No meds

## 2021-12-03 NOTE — Assessment & Plan Note (Signed)
Could be chronotropic (as heart rate low), CHF, coronary ischemia or deconditioning Will refer to cardiology for further evaluation

## 2021-12-03 NOTE — Assessment & Plan Note (Signed)
Mostly hands Okay with prn tylenol 650 and topical diclofenac

## 2021-12-03 NOTE — Assessment & Plan Note (Signed)
Mild Will check labs

## 2021-12-03 NOTE — Assessment & Plan Note (Signed)
Has significant symptoms Could be part of her breathing thing Will start omeprazole daily at bedtime(20mg )

## 2021-12-03 NOTE — Assessment & Plan Note (Signed)
I have personally reviewed the Medicare Annual Wellness questionnaire and have noted 1. The patient's medical and social history 2. Their use of alcohol, tobacco or illicit drugs 3. Their current medications and supplements 4. The patient's functional ability including ADL's, fall risks, home safety risks and hearing or visual             impairment. 5. Diet and physical activities 6. Evidence for depression or mood disorders  The patients weight, height, BMI and visual acuity have been recorded in the chart I have made referrals, counseling and provided education to the patient based review of the above and I have provided the pt with a written personalized care plan for preventive services.  I have provided you with a copy of your personalized plan for preventive services. Please take the time to review along with your updated medication list.  Done with colonoscopies Recent mammogram fine---recommend 1 last one in 2 years No pap ---hyster and age UTD on imms---will need flu in the fall and COVID if new one out Discussed more exercise

## 2021-12-03 NOTE — Progress Notes (Signed)
Subjective:    Patient ID: Kathy Acosta, female    DOB: Apr 29, 1946, 76 y.o.   MRN: [id number]  HPI Here for Medicare wellness visit and follow up of chronic health conditions Reviewed advanced directives Reviewed other doctors---Dr Porfilio---ophthal, Dr Dorothea Ogle No hospitalizations or surgery this year Rare glass of wine No tobacco Vision is okay---drops for dry eyes Hearing is good Not really exercising much--walks a mile sometimes No falls No depression or anhedonia Independent with instrumental ADLs No memory problems  Is concerned about easy exhaustion Tries to walk short distances---but gets DOE with household tasks, going up steps No chest pain--but gets tinge up near left shoulder No palpitations No syncope. Will have vague dizzy feelings No edema Sleeps flat mostly. No PND  Has some mid back pain upon first getting up Does go away after an hour or so Does get heartburn also Takes prilosec but not regularly No dysphagia Does burp  BP seems to be okay Hasn't been checking lately Careful with diet No headaches  Mild tremor ---mostly in hands No problems eating or drinking Still not bad enough to use meds  Does have some pain in hands Uses the tylenol prn Has used diclofenac gel at night  Last sugars 113--108 Tries to be careful with eating  Current Outpatient Medications on File Prior to Visit  Medication Sig Dispense Refill   acetaminophen (TYLENOL) 650 MG CR tablet Take 1,300 mg by mouth every 8 (eight) hours as needed for pain.     amLODipine (NORVASC) 10 MG tablet TAKE 1 TABLET BY MOUTH  DAILY 90 tablet 3   ascorbic acid (VITAMIN C) 500 MG tablet Take 500 mg by mouth daily.     Calcium-Magnesium-Vitamin D (CALCIUM 1200+D3 PO) Take 1 tablet by mouth daily.     Chromium Picolinate (CHROMIUM PICOLATE PO) Take by mouth.     Flaxseed, Linseed, (FLAX PO) Take by mouth.     Multiple Vitamin (MULTIVITAMIN WITH MINERALS) TABS tablet Take 1 tablet  by mouth daily.     Omega-3 Fatty Acids (FISH OIL PO) Take by mouth.     Polyethyl Glycol-Propyl Glycol (SYSTANE OP) Place 1 drop into both eyes as needed.     potassium gluconate 595 (99 K) MG TABS tablet Take 595 mg by mouth daily.     valsartan-hydrochlorothiazide (DIOVAN-HCT) 160-12.5 MG tablet TAKE 1 TABLET BY MOUTH  DAILY 90 tablet 3   No current facility-administered medications on file prior to visit.    Allergies  Allergen Reactions   Doxazosin     dizziness    Past Medical History:  Diagnosis Date   Arthritis    Cataract    bilateral repair   Essential tremor    last 2 years   Hx of colonic polyps    Hypertension    Impaired fasting glucose     Past Surgical History:  Procedure Laterality Date   ABDOMINAL HYSTERECTOMY     CATARACT EXTRACTION W/PHACO Right 07/12/2020   Procedure: CATARACT EXTRACTION PHACO AND INTRAOCULAR LENS PLACEMENT ([place]) RIGHT;  Surgeon: Birder Robson, MD;  Location: [facility];  Service: Ophthalmology;  Laterality: Right;  7.35 0:46.4   CATARACT EXTRACTION W/PHACO Left 07/31/2020   Procedure: CATARACT EXTRACTION PHACO AND INTRAOCULAR LENS PLACEMENT ([place]) LEFT;  Surgeon: Birder Robson, MD;  Location: [facility];  Service: Ophthalmology;  Laterality: Left;  6.27 0:36.5   CERVICAL DISCECTOMY  1992   2 times   CESAREAN SECTION     2 times  COLONOSCOPY  2016    Family History  Problem Relation Age of Onset   Hypertension Mother    Hypertension Father    Diabetes Father    Diabetes Brother    Cancer Paternal Aunt        colon   Colon cancer Paternal Aunt    Diabetes Brother    Heart disease Sister    Colon cancer Sister    Colon polyps Neg Hx    Esophageal cancer Neg Hx    Stomach cancer Neg Hx    Rectal cancer Neg Hx     Social History   Socioeconomic History   Marital status: Married    Spouse name: Not on file   Number of children: 2   Years of education: Not on file   Highest education level: Not  on file  Occupational History   Occupation: retired- Games developer. [employer]  Tobacco Use   Smoking status: Never   Smokeless tobacco: Never  Vaping Use   Vaping Use: Never used  Substance and Sexual Activity   Alcohol use: Yes    Alcohol/week: 0.0 standard drinks    Comment: wine - rare   Drug use: No   Sexual activity: Never    Birth control/protection: Surgical  Other Topics Concern   Not on file  Social History Narrative   No living will   Requests sons to be health care POA   Would like attempts at resuscitation.   Probably wouldn't want tube feeds if cognitively unaware   Social Determinants of Health   Financial Resource Strain: Not on file  Food Insecurity: Not on file  Transportation Needs: Not on file  Physical Activity: Not on file  Stress: Not on file  Social Connections: Not on file  Intimate Partner Violence: Not on file   Review of Systems Appetite is good Weight is up slightly Sleep is not restful at times---generally okay Wears seat belt Teeth are okay---keeps up with dentist Some itchy ankles--dry skin. Plans to see derm. No suspicious lesions Bowels move fine--no blood Voids fine----occ mild leakage (pad for protection if going out)     Objective:   Physical Exam Constitutional:      Appearance: Normal appearance.  HENT:     Mouth/Throat:     Comments: No lesions Eyes:     Conjunctiva/sclera: Conjunctivae normal.     Pupils: Pupils are equal, round, and reactive to light.  Cardiovascular:     Rate and Rhythm: Regular rhythm. Bradycardia present.     Pulses: Normal pulses.     Heart sounds:    No gallop.     Comments: HR in 53'Z Soft systolic murmur at base Pulmonary:     Effort: Pulmonary effort is normal.     Breath sounds: Normal breath sounds. No wheezing or rales.  Abdominal:     Palpations: Abdomen is soft.     Tenderness: There is no abdominal tenderness.  Musculoskeletal:     Cervical back: Neck supple.     Right  lower leg: No edema.     Left lower leg: No edema.  Lymphadenopathy:     Cervical: No cervical adenopathy.  Skin:    Findings: No lesion or rash.  Neurological:     General: No focal deficit present.     Mental Status: She is alert and oriented to person, place, and time.     Comments: Mini-cog normal  Mild tremor in hands  Psychiatric:  Mood and Affect: Mood normal.        Behavior: Behavior normal.           Assessment & Plan:

## 2021-12-04 ENCOUNTER — Other Ambulatory Visit: Payer: Self-pay

## 2021-12-04 MED ORDER — METFORMIN HCL ER 500 MG PO TB24: 500.0000 mg | ORAL_TABLET | Freq: Every day | ORAL | 3 refills | Status: DC

## 2021-12-05 ENCOUNTER — Telehealth: Payer: Self-pay | Admitting: Internal Medicine

## 2021-12-05 NOTE — Telephone Encounter (Signed)
Kathy Acosta called in and stated that she was recently diagonsed with diabetes and wanted to know about getting the meter and strips and how to check her levels. And wanted to know type she has.   Pharmacy- [facility] mail order

## 2021-12-10 ENCOUNTER — Encounter: Payer: Self-pay | Admitting: Internal Medicine

## 2021-12-11 ENCOUNTER — Ambulatory Visit: Payer: Medicare Other | Admitting: Internal Medicine

## 2021-12-11 ENCOUNTER — Encounter: Payer: Self-pay | Admitting: Internal Medicine

## 2021-12-11 ENCOUNTER — Ambulatory Visit (INDEPENDENT_AMBULATORY_CARE_PROVIDER_SITE_OTHER): Payer: Medicare Other | Admitting: Cardiology

## 2021-12-11 ENCOUNTER — Encounter: Payer: Self-pay | Admitting: *Deleted

## 2021-12-11 ENCOUNTER — Other Ambulatory Visit: Payer: Self-pay

## 2021-12-11 ENCOUNTER — Ambulatory Visit (INDEPENDENT_AMBULATORY_CARE_PROVIDER_SITE_OTHER): Payer: Medicare Other

## 2021-12-11 VITALS — BP 180/84 | HR 43 | Ht 60.05 in | Wt 166.0 lb

## 2021-12-11 DIAGNOSIS — Z01818 Encounter for other preprocedural examination: Secondary | ICD-10-CM | POA: Diagnosis not present

## 2021-12-11 DIAGNOSIS — I459 Conduction disorder, unspecified: Secondary | ICD-10-CM

## 2021-12-11 DIAGNOSIS — R001 Bradycardia, unspecified: Secondary | ICD-10-CM | POA: Diagnosis not present

## 2021-12-11 DIAGNOSIS — I441 Atrioventricular block, second degree: Secondary | ICD-10-CM

## 2021-12-11 DIAGNOSIS — I10 Essential (primary) hypertension: Secondary | ICD-10-CM | POA: Diagnosis not present

## 2021-12-11 LAB — ECHOCARDIOGRAM COMPLETE
AR max vel: 2.55 cm2
AV Area VTI: 2.75 cm2
AV Area mean vel: 2.55 cm2
AV Mean grad: 7 mmHg
AV Peak grad: 14.6 mmHg
Ao pk vel: 1.91 m/s
Calc EF: 77.4 %
Height: 60.05 in
Radius: 0.4 cm
S' Lateral: 2 cm
Single Plane A2C EF: 79.6 %
Single Plane A4C EF: 76.8 %
Weight: 2656 oz

## 2021-12-11 NOTE — H&P (View-Only) (Signed)
Electrophysiology Office Note:    Date:  12/11/2021   ID:  Kathy Acosta, DOB April 08, 1946, MRN [id number]  PCP:  Venia Carbon, MD  [facility] Cardiologist:  None  [facility] Electrophysiologist:  Vickie Epley, MD   Referring MD: Nelva Bush, MD   Chief Complaint: Heart block, symptomatic bradycardia  History of Present Illness:    Kathy Acosta is a 76 y.o. female who presents for an evaluation of symptomatic bradycardia at the request of Dr. Saunders Revel. Their medical history includes hypertension.  The patient saw Dr. Saunders Revel today in clinic.  The patient tells me that for the last several weeks she has felt very short of breath and tired with minimal exertion.  She is typically someone who is very active but starting around Thanksgiving and Christmas she has been fatigued which is a new symptom for her.  No syncope.  She tells me that she feels relatively okay at rest but when she tries to exert herself she loses steam quickly and has to rest.  When she presented to Dr. Robb Matar clinic today, she was noted to be bradycardic and EKG revealed 2-1 heart block.  During ambulation around the clinic she had very poor chronotropic competence with heart rate staying in the 40s.     Past Medical History:  Diagnosis Date   Arthritis    Cataract    bilateral repair   Essential tremor    last 2 years   Hx of colonic polyps    Hypertension    Impaired fasting glucose     Past Surgical History:  Procedure Laterality Date   ABDOMINAL HYSTERECTOMY     CATARACT EXTRACTION W/PHACO Right 07/12/2020   Procedure: CATARACT EXTRACTION PHACO AND INTRAOCULAR LENS PLACEMENT ([place]) RIGHT;  Surgeon: Birder Robson, MD;  Location: [facility];  Service: Ophthalmology;  Laterality: Right;  7.35 0:46.4   CATARACT EXTRACTION W/PHACO Left 07/31/2020   Procedure: CATARACT EXTRACTION PHACO AND INTRAOCULAR LENS PLACEMENT ([place]) LEFT;  Surgeon: Birder Robson, MD;  Location: [facility];  Service: Ophthalmology;  Laterality: Left;  6.27 0:36.5   CERVICAL DISCECTOMY  1992   2 times   CESAREAN SECTION     2 times   COLONOSCOPY  2016    Current Medications: No outpatient medications have been marked as taking for the 12/11/21 encounter (Office Visit) with Vickie Epley, MD.     Allergies:   Doxazosin   Social History   Socioeconomic History   Marital status: Married    Spouse name: Not on file   Number of children: 2   Years of education: Not on file   Highest education level: Not on file  Occupational History   Occupation: retired- Games developer. [employer]  Tobacco Use   Smoking status: Never   Smokeless tobacco: Never  Vaping Use   Vaping Use: Never used  Substance and Sexual Activity   Alcohol use: Yes    Alcohol/week: 0.0 standard drinks    Comment: wine - rare   Drug use: No   Sexual activity: Never    Birth control/protection: Surgical  Other Topics Concern   Not on file  Social History Narrative   No living will   Requests sons to be health care POA   Would like attempts at resuscitation.   Probably wouldn't want tube feeds if cognitively unaware   Social Determinants of Health   Financial Resource Strain: Not on file  Food Insecurity: Not on file  Transportation  Needs: Not on file  Physical Activity: Not on file  Stress: Not on file  Social Connections: Not on file     Family History: The patient's family history includes Cancer in her paternal aunt; Colon cancer in her paternal aunt and sister; Diabetes in her brother, brother, and father; Heart disease in her sister; Hypertension in her father and mother. There is no history of Colon polyps, Esophageal cancer, Stomach cancer, or Rectal cancer.  ROS:   Please see the history of present illness.    All other systems reviewed and are negative.  EKGs/Labs/Other Studies Reviewed:    The following studies were reviewed today:  Echo today in clinic personally reviewed  (formal read pending) LV function normal Variable MR and TR likely because of variable heart block  EKG:  The ekg ordered today demonstrates 2 1 AV block, narrow QRS   Recent Labs: 12/03/2021: ALT 35; BUN 24; Creatinine, Ser 0.79; Hemoglobin 12.4; Platelets 221.0; Potassium 3.6; Sodium 140  Recent Lipid Panel    Component Value Date/Time   CHOL 159 12/03/2021 1115   TRIG 175.0 (H) 12/03/2021 1115   HDL 43.00 12/03/2021 1115   CHOLHDL 4 12/03/2021 1115   VLDL 35.0 12/03/2021 1115   LDLCALC 81 12/03/2021 1115   LDLDIRECT 83.0 11/22/2018 1602    Physical Exam:    VS:  BP (!) 180/84 (BP Location: Right Arm, Patient Position: Sitting, Cuff Size: Large)    Pulse (!) 43    Ht 5' 0.05" (1.525 m)    Wt 166 lb (75.3 kg)    SpO2 98%    BMI 32.37 kg/m   Wt Readings from Last 3 Encounters:  12/11/21 166 lb (75.3 kg)  12/03/21 165 lb (74.8 kg)  12/05/20 159 lb 8 oz (72.3 kg)     GEN:  Well nourished, well developed in no acute distress HEENT: Normal NECK: No JVD; No carotid bruits LYMPHATICS: No lymphadenopathy CARDIAC: Bradycardic, regular rhythm, no murmurs, rubs, gallops RESPIRATORY:  Clear to auscultation without rales, wheezing or rhonchi  ABDOMEN: Soft, non-tender, non-distended MUSCULOSKELETAL:  No edema; No deformity  SKIN: Warm and dry NEUROLOGIC:  Alert and oriented x 3 PSYCHIATRIC:  Normal affect       ASSESSMENT:    1. Heart block   2. Pre-op evaluation   3. Symptomatic bradycardia   4. 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  Her QRS complex is narrow.  Normal left ventricular function.  She will require permanent pacemaker.  I discussed the procedure in detail with the patient including the risks and recovery and she wishes to proceed.  Given the likelihood that she will pace a high percentage of the  time, favor a conduction system pacemaker system.  Plan for left bundle area lead.  Risks, benefits, alternatives to PPM implantation were discussed in detail with the patient today. The patient understands that the risks include but are not limited to bleeding, infection, pneumothorax, perforation, tamponade, vascular damage, renal failure, MI, stroke, death, and lead dislodgement and wishes to proceed.  We will therefore schedule device implantation at the next available time.   Total time spent with patient today 45 minutes. This includes reviewing records, evaluating the patient and coordinating care.  Medication Adjustments/Labs and Tests Ordered: Current medicines are reviewed at length with the patient today.  Concerns regarding medicines are outlined above.  Orders Placed This Encounter  Procedures   CBC w/Diff   Basic Metabolic Panel (BMET)   No orders of the defined types were placed in this encounter.    Signed, Hilton Cork. Quentin Ore, MD, [facility], [facility] 12/11/2021 5:34 PM    Electrophysiology [facility]

## 2021-12-11 NOTE — Progress Notes (Signed)
New Outpatient Visit Date: 12/11/2021  Referring Provider: Venia Carbon, MD [place],  [place] [address]  Chief Complaint: Fatigue and shortness of breath  HPI:  Ms. Breach is a 76 y.o. female who is being seen today for the evaluation of dyspnea on exertion at the request of Dr. Silvio Pate. She has a history of hypertension, impaired glucose tolerance, and arthritis.  Ms. Ohlendorf reports that she has felt extremely fatigued and short of breath with minimal activity for the last several weeks.  She felt relatively well around Thanksgiving but was already feeling poorly at Christmas.  At times, she feels lightheaded though she has not actually passed out.  She notes some occasional pressure-like pain in the upper chest just below both clavicles, which is most pronounced when she is lying in bed.  She denies exertional pain but notes that she has not been very active due to her profound fatigue.  She denies a history of prior heart disease.  She has not had any palpitations, orthopnea, PND, or edema.  --------------------------------------------------------------------------------------------------  Cardiovascular History & Procedures: Cardiovascular Problems: Heart block  Risk Factors: Hypertension and age > 75  Cath/PCI: None  CV Surgery: None  EP Procedures and Devices: None  Non-Invasive Evaluation(s): None  Recent CV Pertinent Labs: Lab Results  Component Value Date   CHOL 159 12/03/2021   HDL 43.00 12/03/2021   LDLCALC 81 12/03/2021   LDLDIRECT 83.0 11/22/2018   TRIG 175.0 (H) 12/03/2021   CHOLHDL 4 12/03/2021   K 3.6 12/03/2021   BUN 24 (H) 12/03/2021   CREATININE 0.79 12/03/2021    --------------------------------------------------------------------------------------------------  Past Medical History:  Diagnosis Date   Arthritis    Cataract    bilateral repair   Essential tremor    last 2 years   Hx of colonic polyps    Hypertension     Impaired fasting glucose     Past Surgical History:  Procedure Laterality Date   ABDOMINAL HYSTERECTOMY     CATARACT EXTRACTION W/PHACO Right 07/12/2020   Procedure: CATARACT EXTRACTION PHACO AND INTRAOCULAR LENS PLACEMENT ([place]) RIGHT;  Surgeon: Birder Robson, MD;  Location: [facility];  Service: Ophthalmology;  Laterality: Right;  7.35 0:46.4   CATARACT EXTRACTION W/PHACO Left 07/31/2020   Procedure: CATARACT EXTRACTION PHACO AND INTRAOCULAR LENS PLACEMENT ([place]) LEFT;  Surgeon: Birder Robson, MD;  Location: [facility];  Service: Ophthalmology;  Laterality: Left;  6.27 0:36.5   CERVICAL DISCECTOMY  1992   2 times   CESAREAN SECTION     2 times   COLONOSCOPY  2016    Current Meds  Medication Sig   acetaminophen (TYLENOL) 650 MG CR tablet Take 1,300 mg by mouth every 8 (eight) hours as needed for pain.   amLODipine (NORVASC) 10 MG tablet TAKE 1 TABLET BY MOUTH  DAILY   ascorbic acid (VITAMIN C) 500 MG tablet Take 500 mg by mouth daily.   Calcium-Magnesium-Vitamin D (CALCIUM 1200+D3 PO) Take 1 tablet by mouth daily.   Chromium Picolinate (CHROMIUM PICOLATE PO) Take by mouth.   metFORMIN (GLUCOPHAGE-XR) 500 MG 24 hr tablet Take 1 tablet (500 mg total) by mouth daily with breakfast.   Multiple Vitamin (MULTIVITAMIN WITH MINERALS) TABS tablet Take 1 tablet by mouth daily.   Omega-3 Fatty Acids (FISH OIL PO) Take by mouth.   omeprazole (PRILOSEC) 20 MG capsule Take 1 capsule (20 mg total) by mouth daily.   Polyethyl Glycol-Propyl Glycol (SYSTANE OP) Place 1 drop into both eyes  as needed.   potassium gluconate 595 (99 K) MG TABS tablet Take 595 mg by mouth daily.   valsartan-hydrochlorothiazide (DIOVAN-HCT) 160-12.5 MG tablet TAKE 1 TABLET BY MOUTH  DAILY    Allergies: Doxazosin  Social History   Tobacco Use   Smoking status: Never   Smokeless tobacco: Never  Vaping Use   Vaping Use: Never used  Substance Use Topics   Alcohol use: Yes    Alcohol/week:  0.0 standard drinks    Comment: wine - rare   Drug use: No    Family History  Problem Relation Age of Onset   Hypertension Mother    Hypertension Father    Diabetes Father    Diabetes Brother    Cancer Paternal Aunt        colon   Colon cancer Paternal Aunt    Diabetes Brother    Heart disease Sister    Colon cancer Sister    Colon polyps Neg Hx    Esophageal cancer Neg Hx    Stomach cancer Neg Hx    Rectal cancer Neg Hx     Review of Systems: A 12-system review of systems was performed and was negative except as noted in the HPI.  --------------------------------------------------------------------------------------------------  Physical Exam: BP (!) 180/84 (BP Location: Right Arm, Patient Position: Sitting, Cuff Size: Large)    Pulse (!) 43    Ht 5' 0.05" (1.525 m)    Wt 166 lb (75.3 kg)    SpO2 98%    BMI 32.37 kg/m   General: NAD HEENT: No conjunctival pallor or scleral icterus. Facemask in place. Neck: Supple without lymphadenopathy, thyromegaly, JVD, or HJR. No carotid bruit. Lungs: Normal work of breathing. Clear to auscultation bilaterally without wheezes or crackles. Heart: Bradycardic but regular without murmurs, rubs, or gallops. Non-displaced PMI. Abd: Bowel sounds present. Soft, NT/ND without hepatosplenomegaly Ext: No lower extremity edema. Radial, PT, and DP pulses are 2+ bilaterally Skin: Warm and dry without rash. Neuro: CNIII-XII intact. Strength and fine-touch sensation intact in upper and lower extremities bilaterally. Psych: Normal mood and affect.  EKG: Normal sinus rhythm with 2:1 AV block.  Lab Results  Component Value Date   WBC 5.3 12/03/2021   HGB 12.4 12/03/2021   HCT 38.7 12/03/2021   MCV 85.5 12/03/2021   PLT 221.0 12/03/2021    Lab Results  Component Value Date   NA 140 12/03/2021   K 3.6 12/03/2021   CL 101 12/03/2021   CO2 31 12/03/2021   BUN 24 (H) 12/03/2021   CREATININE 0.79 12/03/2021   GLUCOSE 133 (H) 12/03/2021    ALT 35 12/03/2021    Lab Results  Component Value Date   CHOL 159 12/03/2021   HDL 43.00 12/03/2021   LDLCALC 81 12/03/2021   LDLDIRECT 83.0 11/22/2018   TRIG 175.0 (H) 12/03/2021   CHOLHDL 4 12/03/2021     --------------------------------------------------------------------------------------------------  ASSESSMENT AND PLAN: High-grade AV block: I suspect Ms. Hayduk fatigue and exertional dyspnea is related to poor heart rate response in the setting of high-grade AV block (2:1 block).  We had her walk in the office while monitoring her heart rate, which only increased to 48 bpm with activity.  She was out of breath.  She has not had any signs of heart failure on exam.  Her EKG does not show any ischemic changes.  Ms. Tarry is not on any AV nodal blocking agents.  I have reviewed her case with Dr. Quentin Ore, who has also seen Ms. Gunnerson today  and is planning for permanent pacemaker placement tomorrow.  We will obtain an echocardiogram today.  Based on results of echocardiogram and symptoms following pacing, we will need to consider ischemia evaluation at some point.  Hypertension: Blood pressure quite elevated today but in the setting of her bradycardia related to AV block as well as stress associated with today's office visit, we will defer making any medication changes today.  Follow-up: Return to clinic as directed by Dr. Quentin Ore following PPM placement.  Nelva Bush, MD 12/11/2021 9:56 AM

## 2021-12-11 NOTE — Progress Notes (Signed)
Electrophysiology Office Note:    Date:  12/11/2021   ID:  Kathy Acosta, DOB 1946/05/02, MRN [id number]  PCP:  Kathy Carbon, MD  [facility] Cardiologist:  None  [facility] Electrophysiologist:  Vickie Epley, MD   Referring MD: Nelva Bush, MD   Chief Complaint: Heart block, symptomatic bradycardia  History of Present Illness:    Kathy Acosta is a 76 y.o. female who presents for an evaluation of symptomatic bradycardia at the request of Dr. Saunders Revel. Their medical history includes hypertension.  The patient saw Dr. Saunders Revel today in clinic.  The patient tells me that for the last several weeks she has felt very short of breath and tired with minimal exertion.  She is typically someone who is very active but starting around Thanksgiving and Christmas she has been fatigued which is a new symptom for her.  No syncope.  She tells me that she feels relatively okay at rest but when she tries to exert herself she loses steam quickly and has to rest.  When she presented to Dr. Robb Matar clinic today, she was noted to be bradycardic and EKG revealed 2-1 heart block.  During ambulation around the clinic she had very poor chronotropic competence with heart rate staying in the 40s.     Past Medical History:  Diagnosis Date   Arthritis    Cataract    bilateral repair   Essential tremor    last 2 years   Hx of colonic polyps    Hypertension    Impaired fasting glucose     Past Surgical History:  Procedure Laterality Date   ABDOMINAL HYSTERECTOMY     CATARACT EXTRACTION W/PHACO Right 07/12/2020   Procedure: CATARACT EXTRACTION PHACO AND INTRAOCULAR LENS PLACEMENT ([place]) RIGHT;  Surgeon: Birder Robson, MD;  Location: [place];  Service: Ophthalmology;  Laterality: Right;  7.35 0:46.4   CATARACT EXTRACTION W/PHACO Left 07/31/2020   Procedure: CATARACT EXTRACTION PHACO AND INTRAOCULAR LENS PLACEMENT ([place]) LEFT;  Surgeon: Birder Robson, MD;  Location: [place];  Service: Ophthalmology;  Laterality: Left;  6.27 0:36.5   CERVICAL DISCECTOMY  1992   2 times   CESAREAN SECTION     2 times   COLONOSCOPY  2016    Current Medications: No outpatient medications have been marked as taking for the 12/11/21 encounter (Office Visit) with Vickie Epley, MD.     Allergies:   Doxazosin   Social History   Socioeconomic History   Marital status: Married    Spouse name: Not on file   Number of children: 2   Years of education: Not on file   Highest education level: Not on file  Occupational History   Occupation: retired- Games developer[employer]  Tobacco Use   Smoking status: Never   Smokeless tobacco: Never  Vaping Use   Vaping Use: Never used  Substance and Sexual Activity   Alcohol use: Yes    Alcohol/week: 0.0 standard drinks    Comment: wine - rare   Drug use: No   Sexual activity: Never    Birth control/protection: Surgical  Other Topics Concern   Not on file  Social History Narrative   No living will   Requests sons to be health care POA   Would like attempts at resuscitation.   Probably wouldn't want tube feeds if cognitively unaware   Social Determinants of Health   Financial Resource Strain: Not on file  Food Insecurity: Not on file  Transportation  Needs: Not on file  Physical Activity: Not on file  Stress: Not on file  Social Connections: Not on file     Family History: The patient's family history includes Cancer in her paternal aunt; Colon cancer in her paternal aunt and sister; Diabetes in her brother, brother, and father; Heart disease in her sister; Hypertension in her father and mother. There is no history of Colon polyps, Esophageal cancer, Stomach cancer, or Rectal cancer.  ROS:   Please see the history of present illness.    All other systems reviewed and are negative.  EKGs/Labs/Other Studies Reviewed:    The following studies were reviewed today:  Echo today in clinic personally reviewed  (formal read pending) LV function normal Variable MR and TR likely because of variable heart block  EKG:  The ekg ordered today demonstrates 2 1 AV block, narrow QRS   Recent Labs: 12/03/2021: ALT 35; BUN 24; Creatinine, Ser 0.79; Hemoglobin 12.4; Platelets 221.0; Potassium 3.6; Sodium 140  Recent Lipid Panel    Component Value Date/Time   CHOL 159 12/03/2021 1115   TRIG 175.0 (H) 12/03/2021 1115   HDL 43.00 12/03/2021 1115   CHOLHDL 4 12/03/2021 1115   VLDL 35.0 12/03/2021 1115   LDLCALC 81 12/03/2021 1115   LDLDIRECT 83.0 11/22/2018 1602    Physical Exam:    VS:  BP (!) 180/84 (BP Location: Right Arm, Patient Position: Sitting, Cuff Size: Large)    Pulse (!) 43    Ht 5' 0.05" (1.525 m)    Wt 166 lb (75.3 kg)    SpO2 98%    BMI 32.37 kg/m   Wt Readings from Last 3 Encounters:  12/11/21 166 lb (75.3 kg)  12/03/21 165 lb (74.8 kg)  12/05/20 159 lb 8 oz (72.3 kg)     GEN:  Well nourished, well developed in no acute distress HEENT: Normal NECK: No JVD; No carotid bruits LYMPHATICS: No lymphadenopathy CARDIAC: Bradycardic, regular rhythm, no murmurs, rubs, gallops RESPIRATORY:  Clear to auscultation without rales, wheezing or rhonchi  ABDOMEN: Soft, non-tender, non-distended MUSCULOSKELETAL:  No edema; No deformity  SKIN: Warm and dry NEUROLOGIC:  Alert and oriented x 3 PSYCHIATRIC:  Normal affect       ASSESSMENT:    1. Heart block   2. Pre-op evaluation   3. Symptomatic bradycardia   4. 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  Her QRS complex is narrow.  Normal left ventricular function.  She will require permanent pacemaker.  I discussed the procedure in detail with the patient including the risks and recovery and she wishes to proceed.  Given the likelihood that she will pace a high percentage of the  time, favor a conduction system pacemaker system.  Plan for left bundle area lead.  Risks, benefits, alternatives to PPM implantation were discussed in detail with the patient today. The patient understands that the risks include but are not limited to bleeding, infection, pneumothorax, perforation, tamponade, vascular damage, renal failure, MI, stroke, death, and lead dislodgement and wishes to proceed.  We will therefore schedule device implantation at the next available time.   Total time spent with patient today 45 minutes. This includes reviewing records, evaluating the patient and coordinating care.  Medication Adjustments/Labs and Tests Ordered: Current medicines are reviewed at length with the patient today.  Concerns regarding medicines are outlined above.  Orders Placed This Encounter  Procedures   CBC w/Diff   Basic Metabolic Panel (BMET)   No orders of the defined types were placed in this encounter.    Signed, Hilton Cork. Quentin Ore, MD, [facility], [facility] 12/11/2021 5:34 PM    Electrophysiology [facility]

## 2021-12-11 NOTE — Patient Instructions (Signed)
Medications: Your physician recommends that you continue on your current medications as directed. Please refer to the Current Medication list given to you today. *If you need a refill on your cardiac medications before your next appointment, please call your pharmacy*  Lab Work: CBC, BMP  If you have labs (blood work) drawn today and your tests are completely normal, you will receive your results only by: [place] (if you have MyChart) OR A paper copy in the mail If you have any lab test that is abnormal or we need to change your treatment, we will call you to review the results.  Testing/Procedures: Your physician has recommended that you have a pacemaker inserted. A pacemaker is a small device that is placed under the skin of your chest or abdomen to help control abnormal heart rhythms. This device uses electrical pulses to prompt the heart to beat at a normal rate. Pacemakers are used to treat heart rhythms that are too slow. Wire (leads) are attached to the pacemaker that goes into the chambers of you heart. This is done in the hospital and usually requires and overnight stay. Please see the instruction sheet given to you today for more information.   Follow-Up: At [facility], you and your health needs are our priority.  As part of our continuing mission to provide you with exceptional heart care, we have created designated Provider Care Teams.  These Care Teams include your primary Cardiologist (physician) and Advanced Practice Providers (APPs -  Physician Assistants and Nurse Practitioners) who all work together to provide you with the care you need, when you need it.  Your physician wants you to follow-up in: (Feb 9) 10-14 days post op with Device Clinic and 91 days post op with Dr. Quentin Ore.  We recommend signing up for the patient portal called "MyChart".  Sign up information is provided on this After Visit Summary.  MyChart is used to connect with patients for Virtual Visits  (Telemedicine).  Patients are able to view lab/test results, encounter notes, upcoming appointments, etc.  Non-urgent messages can be sent to your provider as well.   To learn more about what you can do with MyChart, go to [email or web address].    Any Other Special Instructions Will Be Listed Below (If Applicable).   Pacemaker Implantation, Adult Pacemaker implantation is a procedure to place a pacemaker inside the chest. A pacemaker is a small computer that sends electrical signals to the heart and helps the heart beat normally. A pacemaker also stores information about heart rhythms. You may need pacemaker implantation if you have: A slow heartbeat (bradycardia). Loss of consciousness that happens repeatedly (syncope) or repeated episodes of dizziness or light-headedness because of an irregular heart rate. Shortness of breath (dyspnea) due to heart problems. The pacemaker usually attaches to your heart through a wire called a lead. One or two leads may be needed. There are different types of pacemakers: Transvenous pacemaker. This type is placed under the skin or muscle of your upper chest area. The lead goes through a vein in the chest area to reach the inside of the heart. Epicardial pacemaker. This type is placed under the skin or muscle of your chest or abdomen. The lead goes through your chest to the outside of the heart. Tell a health care provider about: Any allergies you have. All medicines you are taking, including vitamins, herbs, eye drops, creams, and over-the-counter medicines. Any problems you or family members have had with anesthetic medicines. Any blood or bone disorders  you have. Any surgeries you have had. Any medical conditions you have. Whether you are pregnant or may be pregnant. What are the risks? Generally, this is a safe procedure. However, problems may occur, including: Infection. Bleeding. Failure of the pacemaker or the lead. Collapse of a lung or bleeding  into a lung. Blood clot inside a blood vessel with a lead. Damage to the heart. Infection inside the heart (endocarditis). Allergic reactions to medicines. What happens before the procedure? Staying hydrated Follow instructions from your health care provider about hydration, which may include: Up to 2 hours before the procedure - you may continue to drink clear liquids, such as water, clear fruit juice, black coffee, and plain tea.  Eating and drinking restrictions Follow instructions from your health care provider about eating and drinking, which may include: 8 hours before the procedure - stop eating heavy meals or foods, such as meat, fried foods, or fatty foods. 6 hours before the procedure - stop eating light meals or foods, such as toast or cereal. 6 hours before the procedure - stop drinking milk or drinks that contain milk. 2 hours before the procedure - stop drinking clear liquids. Medicines Ask your health care provider about: Changing or stopping your regular medicines. This is especially important if you are taking diabetes medicines or blood thinners. Taking medicines such as aspirin and ibuprofen. These medicines can thin your blood. Do not take these medicines unless your health care provider tells you to take them. Taking over-the-counter medicines, vitamins, herbs, and supplements. Tests You may have: A heart evaluation. This may include: An electrocardiogram (ECG). This involves placing patches on your skin to check your heart rhythm. A chest X-ray. An echocardiogram. This is a test that uses sound waves (ultrasound) to produce an image of the heart. A cardiac rhythm monitor. This is used to record your heart rhythm and any events for a longer period of time. Blood tests. Genetic testing. General instructions Do not use any products that contain nicotine or tobacco for at least 4 weeks before the procedure. These products include cigarettes, e-cigarettes, and chewing  tobacco. If you need help quitting, ask your health care provider. Ask your health care provider: How your surgery site will be marked. What steps will be taken to help prevent infection. These steps may include: Removing hair at the surgery site. Washing skin with a germ-killing soap. Receiving antibiotic medicine. Plan to have someone take you home from the hospital or clinic. If you will be going home right after the procedure, plan to have someone with you for 24 hours. What happens during the procedure? An IV will be inserted into one of your veins. You will be given one or more of the following: A medicine to help you relax (sedative). A medicine to numb the area (local anesthetic). A medicine to make you fall asleep (general anesthetic). The next steps vary depending on the type of pacemaker you will be getting. If you are getting a transvenous pacemaker: An incision will be made in your upper chest. A pocket will be made for the pacemaker. It may be placed under the skin or between layers of muscle. The lead will be inserted into a blood vessel that goes to the heart. While X-rays are taken by an imaging machine (fluoroscopy), the lead will be advanced through the vein to the inside of your heart. The other end of the lead will be tunneled under the skin and attached to the pacemaker. If you are getting  an epicardial pacemaker: An incision will be made near your ribs or breastbone (sternum) for the lead. The lead will be attached to the outside of your heart. Another incision will be made in your chest or upper abdomen to create a pocket for the pacemaker. The free end of the lead will be tunneled under the skin and attached to the pacemaker. The transvenous or epicardial pacemaker will be tested. Imaging studies may be done to check the lead position. The incisions will be closed with stitches (sutures), adhesive strips, or skin glue. Bandages (dressings) will be placed over the  incisions. The procedure may vary among health care providers and hospitals. What happens after the procedure? Your blood pressure, heart rate, breathing rate, and blood oxygen level will be monitored until you leave the hospital or clinic. You may be given antibiotics. You will be given pain medicine. An ECG and chest X-rays will be done. You may need to wear a continuous type of ECG (Holter monitor) to check your heart rhythm. Your health care provider will program the pacemaker. If you were given a sedative during the procedure, it can affect you for several hours. Do not drive or operate machinery until your health care provider says that it is safe. You will be given a pacemaker identification card. This card lists the implant date, device model, and manufacturer of your pacemaker. Summary A pacemaker is a small computer that sends electrical signals to the heart and helps the heart beat normally. There are different types of pacemakers. A pacemaker may be placed under the skin or muscle of your chest or abdomen. Follow instructions from your health care provider about eating and drinking and about taking medicines before the procedure. This information is not intended to replace advice given to you by your health care provider. Make sure you discuss any questions you have with your health care provider. Document Revised: 07/02/2021 Document Reviewed: 09/21/2019 Elsevier Patient Education  2022 [employer].

## 2021-12-12 ENCOUNTER — Other Ambulatory Visit: Payer: Self-pay

## 2021-12-12 ENCOUNTER — Ambulatory Visit (HOSPITAL_COMMUNITY)
Admission: RE | Admit: 2021-12-12 | Discharge: 2021-12-13 | Disposition: A | Discharge status: Home / Self Care | Payer: Medicare Other | Attending: Cardiology | Admitting: Cardiology

## 2021-12-12 ENCOUNTER — Encounter (HOSPITAL_COMMUNITY)
Admission: RE | Disposition: A | Discharge status: Home / Self Care | Payer: Self-pay | Source: Home / Self Care | Attending: Cardiology

## 2021-12-12 ENCOUNTER — Encounter (HOSPITAL_COMMUNITY): Payer: Self-pay | Admitting: Cardiology

## 2021-12-12 DIAGNOSIS — I10 Essential (primary) hypertension: Secondary | ICD-10-CM | POA: Diagnosis not present

## 2021-12-12 DIAGNOSIS — G250 Essential tremor: Secondary | ICD-10-CM | POA: Insufficient documentation

## 2021-12-12 DIAGNOSIS — I459 Conduction disorder, unspecified: Secondary | ICD-10-CM | POA: Diagnosis present

## 2021-12-12 DIAGNOSIS — M1990 Unspecified osteoarthritis, unspecified site: Secondary | ICD-10-CM | POA: Diagnosis not present

## 2021-12-12 DIAGNOSIS — I441 Atrioventricular block, second degree: Secondary | ICD-10-CM | POA: Diagnosis not present

## 2021-12-12 DIAGNOSIS — R001 Bradycardia, unspecified: Secondary | ICD-10-CM | POA: Diagnosis not present

## 2021-12-12 DIAGNOSIS — Z950 Presence of cardiac pacemaker: Secondary | ICD-10-CM

## 2021-12-12 HISTORY — PX: PACEMAKER IMPLANT: EP1218

## 2021-12-12 LAB — CBC WITH DIFFERENTIAL/PLATELET
Basophils Absolute: 0 10*3/uL (ref 0.0–0.2)
Basos: 1 %
EOS (ABSOLUTE): 0.1 10*3/uL (ref 0.0–0.4)
Eos: 2 %
Hematocrit: 38.3 % (ref 34.0–46.6)
Hemoglobin: 12.5 g/dL (ref 11.1–15.9)
Immature Grans (Abs): 0.1 10*3/uL (ref 0.0–0.1)
Immature Granulocytes: 2 %
Lymphocytes Absolute: 1.9 10*3/uL (ref 0.7–3.1)
Lymphs: 33 %
MCH: 27.5 pg (ref 26.6–33.0)
MCHC: 32.6 g/dL (ref 31.5–35.7)
MCV: 84 fL (ref 79–97)
Monocytes Absolute: 0.7 10*3/uL (ref 0.1–0.9)
Monocytes: 13 %
Neutrophils Absolute: 2.9 10*3/uL (ref 1.4–7.0)
Neutrophils: 49 %
Platelets: 214 10*3/uL (ref 150–450)
RBC: 4.54 x10E6/uL (ref 3.77–5.28)
RDW: 13.8 % (ref 11.7–15.4)
WBC: 5.7 10*3/uL (ref 3.4–10.8)

## 2021-12-12 LAB — BASIC METABOLIC PANEL
BUN/Creatinine Ratio: 23 (ref 12–28)
BUN: 18 mg/dL (ref 8–27)
CO2: 25 mmol/L (ref 20–29)
Calcium: 9.8 mg/dL (ref 8.7–10.3)
Chloride: 100 mmol/L (ref 96–106)
Creatinine, Ser: 0.78 mg/dL (ref 0.57–1.00)
Glucose: 106 mg/dL — ABNORMAL HIGH (ref 70–99)
Potassium: 3.8 mmol/L (ref 3.5–5.2)
Sodium: 141 mmol/L (ref 134–144)
eGFR: 78 mL/min/{1.73_m2} (ref >59)

## 2021-12-12 SURGERY — PACEMAKER IMPLANT

## 2021-12-12 MED ORDER — SODIUM CHLORIDE 0.9 % IV SOLN
80.0000 mg | INTRAVENOUS | Status: AC
  Administered 2021-12-12: 80 mg
  Filled 2021-12-12: qty 2

## 2021-12-12 MED ORDER — FENTANYL CITRATE (PF) 100 MCG/2ML IJ SOLN
INTRAMUSCULAR | Status: DC | PRN
  Administered 2021-12-12 (×3): 25 ug via INTRAVENOUS

## 2021-12-12 MED ORDER — LIDOCAINE HCL (PF) 1 % IJ SOLN
INTRAMUSCULAR | Status: DC | PRN
  Administered 2021-12-12: 60 mL

## 2021-12-12 MED ORDER — PANTOPRAZOLE SODIUM 40 MG PO TBEC
40.0000 mg | DELAYED_RELEASE_TABLET | Freq: Every day | ORAL | Status: DC
  Filled 2021-12-12: qty 1

## 2021-12-12 MED ORDER — SODIUM CHLORIDE 0.9 % IV SOLN
INTRAVENOUS | Status: AC
  Filled 2021-12-12: qty 2

## 2021-12-12 MED ORDER — LIDOCAINE HCL (PF) 1 % IJ SOLN
INTRAMUSCULAR | Status: AC
  Filled 2021-12-12: qty 30

## 2021-12-12 MED ORDER — CHLORHEXIDINE GLUCONATE 4 % EX LIQD
4.0000 "application " | Freq: Once | CUTANEOUS | Status: DC
  Filled 2021-12-12: qty 60

## 2021-12-12 MED ORDER — POVIDONE-IODINE 10 % EX SWAB
2.0000 "application " | Freq: Once | CUTANEOUS | Status: AC
  Administered 2021-12-12: 2 via TOPICAL

## 2021-12-12 MED ORDER — HEPARIN (PORCINE) IN NACL 1000-0.9 UT/500ML-% IV SOLN
INTRAVENOUS | Status: DC | PRN
  Administered 2021-12-12: 500 mL

## 2021-12-12 MED ORDER — FENTANYL CITRATE (PF) 100 MCG/2ML IJ SOLN
INTRAMUSCULAR | Status: AC
  Filled 2021-12-12: qty 2

## 2021-12-12 MED ORDER — MIDAZOLAM HCL 5 MG/5ML IJ SOLN
INTRAMUSCULAR | Status: AC
  Filled 2021-12-12: qty 5

## 2021-12-12 MED ORDER — HYDROCHLOROTHIAZIDE 12.5 MG PO TABS
12.5000 mg | ORAL_TABLET | Freq: Every day | ORAL | Status: DC
  Administered 2021-12-12 – 2021-12-13 (×2): 12.5 mg via ORAL
  Filled 2021-12-12 (×2): qty 1

## 2021-12-12 MED ORDER — IRBESARTAN 150 MG PO TABS
150.0000 mg | ORAL_TABLET | Freq: Every day | ORAL | Status: DC
  Administered 2021-12-12 – 2021-12-13 (×2): 150 mg via ORAL
  Filled 2021-12-12 (×2): qty 1

## 2021-12-12 MED ORDER — CEFAZOLIN SODIUM-DEXTROSE 2-4 GM/100ML-% IV SOLN
2.0000 g | INTRAVENOUS | Status: AC
  Administered 2021-12-12: 2 g via INTRAVENOUS
  Filled 2021-12-12: qty 100

## 2021-12-12 MED ORDER — CEFAZOLIN SODIUM-DEXTROSE 2-4 GM/100ML-% IV SOLN
INTRAVENOUS | Status: AC
  Filled 2021-12-12: qty 100

## 2021-12-12 MED ORDER — VALSARTAN-HYDROCHLOROTHIAZIDE 160-12.5 MG PO TABS: 1.0000 | ORAL_TABLET | Freq: Every day | ORAL | Status: DC

## 2021-12-12 MED ORDER — AMLODIPINE BESYLATE 10 MG PO TABS
10.0000 mg | ORAL_TABLET | Freq: Every day | ORAL | Status: DC
  Administered 2021-12-13: 10 mg via ORAL
  Filled 2021-12-12: qty 1

## 2021-12-12 MED ORDER — MIDAZOLAM HCL 5 MG/5ML IJ SOLN
INTRAMUSCULAR | Status: DC | PRN
  Administered 2021-12-12 (×3): 1 mg via INTRAVENOUS

## 2021-12-12 MED ORDER — ONDANSETRON HCL 4 MG/2ML IJ SOLN: 4.0000 mg | Freq: Four times a day (QID) | INTRAMUSCULAR | Status: DC | PRN

## 2021-12-12 MED ORDER — HEPARIN (PORCINE) IN NACL 1000-0.9 UT/500ML-% IV SOLN
INTRAVENOUS | Status: AC
  Filled 2021-12-12: qty 500

## 2021-12-12 MED ORDER — SODIUM CHLORIDE 0.9 % IV SOLN: INTRAVENOUS | Status: DC

## 2021-12-12 MED ORDER — ACETAMINOPHEN 325 MG PO TABS
325.0000 mg | ORAL_TABLET | ORAL | Status: DC | PRN
  Administered 2021-12-12 – 2021-12-13 (×3): 650 mg via ORAL
  Filled 2021-12-12 (×3): qty 2

## 2021-12-12 SURGICAL SUPPLY — 12 items
CABLE SURGICAL S-101-97-12 (CABLE) ×2 IMPLANT
CATH RIGHTSITE C315HIS02 (CATHETERS) ×1 IMPLANT
IPG PACE AZUR XT DR MRI W1DR01 (Pacemaker) IMPLANT
LEAD CAPSURE NOVUS 5076-52CM (Lead) ×1 IMPLANT
LEAD SELECT SECURE 3830 383069 (Lead) IMPLANT
PACE AZURE XT DR MRI W1DR01 (Pacemaker) ×2 IMPLANT
PAD DEFIB RADIO PHYSIO CONN (PAD) ×2 IMPLANT
SELECT SECURE 3830 383069 (Lead) ×2 IMPLANT
SHEATH 7FR PRELUDE SNAP 13 (SHEATH) ×2 IMPLANT
SLITTER 6232ADJ (MISCELLANEOUS) ×1 IMPLANT
TRAY PACEMAKER INSERTION (PACKS) ×2 IMPLANT
WIRE HI TORQ VERSACORE-J 145CM (WIRE) ×1 IMPLANT

## 2021-12-12 NOTE — Progress Notes (Signed)
°  Transition of Care [facility]) Screening Note   Patient Details  Name: Kathy Acosta Date of Birth: 26-Aug-1946   Transition of Care [facility]) CM/SW Contact:    Milas Gain, [place] Phone Number: 12/12/2021, 4:26 PM    Transition of Care Department [facility]) has reviewed patient and no TOC needs have been identified at this time. We will continue to monitor patient advancement through interdisciplinary progression rounds. If new patient transition needs arise, please place a TOC consult.

## 2021-12-12 NOTE — Interval H&P Note (Signed)
History and Physical Interval Note:  12/12/2021 10:09 AM  Kathy Acosta  has presented today for surgery, with the diagnosis of bradycardia.  The various methods of treatment have been discussed with the patient and family. After consideration of risks, benefits and other options for treatment, the patient has consented to  Procedure(s): PACEMAKER IMPLANT (N/A) as a surgical intervention.  The patient's history has been reviewed, patient examined, no change in status, stable for surgery.  I have reviewed the patient's chart and labs.  Questions were answered to the patient's satisfaction.     Tabatha Razzano T Nataleah Scioneaux

## 2021-12-12 NOTE — Discharge Instructions (Addendum)
° ° °  Supplemental Discharge Instructions for  Pacemaker/Defibrillator Patients   Activity No heavy lifting or vigorous activity with your left/right arm for 6 to 8 weeks.  Do not raise your left/right arm above your head for one week.  Gradually raise your affected arm as drawn below.             12/17/21                    12/18/21                     12/19/21                  12/20/21 __  NO DRIVING for   1 week  ; you may begin driving on   2/99/24  .  WOUND CARE Keep the wound area clean and dry.  Do not get this area wet , no showers for one week; you may shower on  12/20/21   . Remove the arm sling 12/14/21 The tape/steri-strips on your wound will fall off; do not pull them off.  No bandage is needed on the site.  DO  NOT apply any creams, oils, or ointments to the wound area. If you notice any drainage or discharge from the wound, any swelling or bruising at the site, or you develop a fever > 101? F after you are discharged home, call the office at once.  Special Instructions You are still able to use cellular telephones; use the ear opposite the side where you have your pacemaker/defibrillator.  Avoid carrying your cellular phone near your device. When traveling through airports, show security personnel your identification card to avoid being screened in the metal detectors.  Ask the security personnel to use the hand wand. Avoid arc welding equipment, MRI testing (magnetic resonance imaging), TENS units (transcutaneous nerve stimulators).  Call the office for questions about other devices. Avoid electrical appliances that are in poor condition or are not properly grounded. Microwave ovens are safe to be near or to operate.

## 2021-12-12 NOTE — Discharge Summary (Signed)
ELECTROPHYSIOLOGY PROCEDURE DISCHARGE SUMMARY    Patient ID: Kathy Acosta,  MRN: [id number], DOB/AGE: 78/17/1947 78 y.o.  Admit date: 12/12/2021 Discharge date: 12/13/21  Primary Care Physician: Kathy Carbon, MD  Primary Cardiologist: Dr. Saunders Revel Electrophysiologist: Dr. Quentin Acosta  Primary Discharge Diagnosis:  Symptomatic bradycardia status post pacemaker implantation this admission  Secondary Discharge Diagnosis:  Essential tremor HTN arthritis  Allergies  Allergen Reactions   Doxazosin     dizziness     Procedures This Admission:  1.  Implantation of a MDT dual chamber PPM on 12/12/21 by Dr Kathy Acosta.  The patient received    Azure XT DR MRI SureScan, serial [id number], model [id number], serial [id number] (LBP), model [id number], serial [id number] There were no immediate post procedure complications. 2.  CXR on 12/13/21 demonstrated no pneumothorax status post device implantation.   Brief HPI: Kathy Acosta is a 76 y.o. female was referred to electrophysiology in the outpatient setting for consideration of PPM implantation.  Past medical history includes above.  The patient has had symptomatic bradycardia without reversible causes identified.  Risks, benefits, and alternatives to PPM implantation were reviewed with the patient who wished to proceed.   Hospital Course:  The patient was admitted and underwent implantation of a PPM with details as outlined above.  She was monitored on telemetry overnight which demonstrated SR/V pacing.  Left chest was without hematoma or ecchymosis.  The device was interrogated and found to be functioning normally.  CXR was obtained and demonstrated no pneumothorax status post device implantation.  Wound care, arm mobility, and restrictions were reviewed with the patient.  The patient feels well, denies any CP/SOB, with minimal site discomfort.   She was examined by Dr. Quentin Acosta and considered stable for discharge to home.    Physical Exam: Vitals:    12/12/21 2023 12/13/21 0035 12/13/21 0435 12/13/21 0827  BP: (!) 171/80 (!) 175/87 (!) 177/95 (!) 167/94  Pulse: 79 76 78   Resp: 20 17 20 13   Temp: 98.9 F (37.2 C) 98.9 F (37.2 C) 98.7 F (37.1 C)   TempSrc: Oral Oral Oral   SpO2: 98% 98% 94% 96%  Weight:      Height:        GEN- The patient is well appearing, alert and oriented x 3 today.   HEENT: normocephalic, atraumatic; sclera clear, conjunctiva pink; hearing intact; oropharynx clear; neck supple, no JVP Lungs- CTA b/l, normal work of breathing.  No wheezes, rales, rhonchi Heart- RRR, no murmurs, rubs or gallops, PMI not laterally displaced GI- soft, non-tender, non-distended Extremities- no clubbing, cyanosis, or edema MS- no significant deformity or atrophy Skin- warm and dry, no rash or lesion, left chest without hematoma, small area of ecchymosis laterally  Psych- euthymic mood, full affect Neuro- no gross deficits   Labs:   Lab Results  Component Value Date   WBC 5.7 12/11/2021   HGB 12.5 12/11/2021   HCT 38.3 12/11/2021   MCV 84 12/11/2021   PLT 214 12/11/2021    Recent Labs  Lab 12/11/21 1143  NA 141  K 3.8  CL 100  CO2 25  BUN 18  CREATININE 0.78  CALCIUM 9.8  GLUCOSE 106*    Discharge Medications:  Allergies as of 12/13/2021       Reactions   Doxazosin    dizziness        Medication List     TAKE these medications    acetaminophen 650 MG CR tablet Commonly known as:  TYLENOL Take 1,300 mg by mouth every 8 (eight) hours as needed for pain.   amLODipine 10 MG tablet Commonly known as: NORVASC TAKE 1 TABLET BY MOUTH  DAILY   ascorbic acid 500 MG tablet Commonly known as: VITAMIN C Take 500 mg by mouth daily.   CHROMIUM PICOLATE PO Take 1 capsule by mouth daily.   FISH OIL PO Take 1 capsule by mouth daily.   metFORMIN 500 MG 24 hr tablet Commonly known as: GLUCOPHAGE-XR Take 1 tablet (500 mg total) by mouth daily with breakfast.   MultiSource Calcium Mag/D Tabs Take 1  tablet by mouth daily.   multivitamin with minerals Tabs tablet Take 1 tablet by mouth daily.   omeprazole 20 MG capsule Commonly known as: PRILOSEC Take 1 capsule (20 mg total) by mouth daily.   potassium gluconate 595 (99 K) MG Tabs tablet Take 595 mg by mouth daily.   SYSTANE OP Place 1 drop into both eyes 3 (three) times daily.   valsartan-hydrochlorothiazide 160-12.5 MG tablet Commonly known as: DIOVAN-HCT TAKE 1 TABLET BY MOUTH  DAILY        Disposition: Home Discharge Instructions     Diet - low sodium heart healthy   Complete by: As directed    Increase activity slowly   Complete by: As directed         Duration of Discharge Encounter: Greater than 30 minutes including physician time.  Venetia Night, PA-C 12/13/2021 9:16 AM

## 2021-12-13 ENCOUNTER — Ambulatory Visit (HOSPITAL_COMMUNITY): Payer: Medicare Other

## 2021-12-13 DIAGNOSIS — I441 Atrioventricular block, second degree: Secondary | ICD-10-CM | POA: Diagnosis not present

## 2021-12-13 DIAGNOSIS — R001 Bradycardia, unspecified: Secondary | ICD-10-CM | POA: Diagnosis not present

## 2021-12-13 DIAGNOSIS — I10 Essential (primary) hypertension: Secondary | ICD-10-CM | POA: Diagnosis not present

## 2021-12-13 DIAGNOSIS — M1990 Unspecified osteoarthritis, unspecified site: Secondary | ICD-10-CM | POA: Diagnosis not present

## 2021-12-13 DIAGNOSIS — Z950 Presence of cardiac pacemaker: Secondary | ICD-10-CM | POA: Diagnosis not present

## 2021-12-13 DIAGNOSIS — G250 Essential tremor: Secondary | ICD-10-CM | POA: Diagnosis not present

## 2021-12-19 ENCOUNTER — Ambulatory Visit: Payer: Medicare Other | Admitting: Internal Medicine

## 2021-12-26 ENCOUNTER — Ambulatory Visit (INDEPENDENT_AMBULATORY_CARE_PROVIDER_SITE_OTHER): Payer: Medicare Other

## 2021-12-26 ENCOUNTER — Other Ambulatory Visit: Payer: Self-pay

## 2021-12-26 DIAGNOSIS — I459 Conduction disorder, unspecified: Secondary | ICD-10-CM

## 2021-12-26 LAB — CUP PACEART INCLINIC DEVICE CHECK
Battery Remaining Longevity: 143 mo
Battery Voltage: 3.21 V
Brady Statistic AP VP Percent: 0.08 %
Brady Statistic AP VS Percent: 0 %
Brady Statistic AS VP Percent: 99.87 %
Brady Statistic AS VS Percent: 0.05 %
Brady Statistic RA Percent Paced: 0.09 %
Brady Statistic RV Percent Paced: 99.95 %
Date Time Interrogation Session: 20230223142300
Implantable Lead Implant Date: 20230209
Implantable Lead Implant Date: 20230209
Implantable Lead Location: 753859
Implantable Lead Location: 753860
Implantable Lead Model: 3830
Implantable Lead Model: 5076
Implantable Pulse Generator Implant Date: 20230209
Lead Channel Impedance Value: 399 Ohm
Lead Channel Impedance Value: 475 Ohm
Lead Channel Impedance Value: 513 Ohm
Lead Channel Impedance Value: 627 Ohm
Lead Channel Pacing Threshold Amplitude: 0.375 V
Lead Channel Pacing Threshold Amplitude: 0.625 V
Lead Channel Pacing Threshold Pulse Width: 0.4 ms
Lead Channel Pacing Threshold Pulse Width: 0.4 ms
Lead Channel Sensing Intrinsic Amplitude: 1.625 mV
Lead Channel Sensing Intrinsic Amplitude: 11.5 mV
Lead Channel Sensing Intrinsic Amplitude: 2.125 mV
Lead Channel Setting Pacing Amplitude: 3.5 V
Lead Channel Setting Pacing Amplitude: 3.5 V
Lead Channel Setting Pacing Pulse Width: 0.4 ms
Lead Channel Setting Sensing Sensitivity: 1.2 mV

## 2021-12-26 NOTE — Patient Instructions (Addendum)

## 2021-12-26 NOTE — Progress Notes (Signed)

## 2021-12-27 ENCOUNTER — Ambulatory Visit: Payer: Medicare Other | Admitting: Cardiology

## 2022-01-06 NOTE — Telephone Encounter (Signed)
Brandy with [facility] wanted to ck status of electronic fax sent on 01/01/22 requesting [facility] certification for medical necessity completed and last 2 office notes. Brandy request cb with update on form. Sending note to [facility] CMA. ?

## 2022-01-06 NOTE — Telephone Encounter (Signed)
May be on my s-drive that I am not attached to at the computer I am on right now. ?

## 2022-01-24 NOTE — Telephone Encounter (Signed)
Spoke to pt. She is not diabetic so she does not qualify for Freestyle Librte. She will buy an OTC meter. ?

## 2022-02-03 NOTE — Telephone Encounter (Signed)
Rose with [facility] called requesting to speak with someone; when I went on phone Kalman Shan was gone; I called [facility] and spoke with Elita Quick, he could not reach [facility] but notes were in pts chart by Rose. Elita Quick wants to know when they can expect return of form and medical records for freestyle libre. I advised that Larene Beach CMA has already spoken with pt and pt is not diabetic she is prediabetic and pt will not qualify for freestyle libre. Elita Quick wanted to know if that meant we would not be sending paperwork and medical records and I said yes that is what that meant. Nothing further needed.  ?

## 2022-02-05 ENCOUNTER — Ambulatory Visit (INDEPENDENT_AMBULATORY_CARE_PROVIDER_SITE_OTHER): Payer: Medicare Other | Admitting: Family Medicine

## 2022-02-05 ENCOUNTER — Encounter: Payer: Self-pay | Admitting: Family Medicine

## 2022-02-05 VITALS — BP 150/80 | HR 81 | Temp 98.5°F | Ht 61.5 in | Wt 160.3 lb

## 2022-02-05 DIAGNOSIS — M65312 Trigger thumb, left thumb: Secondary | ICD-10-CM

## 2022-02-05 MED ORDER — TRIAMCINOLONE ACETONIDE 40 MG/ML IJ SUSP
20.0000 mg | Freq: Once | INTRAMUSCULAR | Status: AC
  Administered 2022-02-05: 20 mg via INTRA_ARTICULAR

## 2022-02-05 NOTE — Progress Notes (Signed)
? ? ?Sheffield Hawker T. Chablis Losh, MD, [facility] ?Therapist, music at [facility] ?[address] ?[place], [address] ? ?Phone: [phone]  FAX: [phone] ? ?Kathy Acosta - 76 y.o. female  MRN [id number]  Date of Birth: 12/15/1945 ? ?Date: 02/05/2022  PCP: Venia Carbon, MD  Referral: Venia Carbon, MD ? ?Chief Complaint  ?Patient presents with  ? Hand Pain  ?  Left Thumb  ? ? ?This visit occurred during the SARS-CoV-2 public health emergency.  Safety protocols were in place, including screening questions prior to the visit, additional usage of staff PPE, and extensive cleaning of exam room while observing appropriate contact time as indicated for disinfecting solutions.  ? ?Subjective:  ? ?Kathy Acosta is a 76 y.o. very pleasant female patient with Body mass index is 29.8 kg/m?. who presents with the following: ? ?Patient presents with left-sided thumb clicking and popping with occasionally getting stuck and pain in the flexor tendon surface. ? ?This is the first time I evaluated the patient for this.  She has a trigger thumb on the right first digit as well as the left third digit in the past. ? ?Obvious trigger thumb, clicking and popping.  Procedure only: ? ? ?  ICD-10-CM   ?1. Trigger thumb, left thumb  M65.312 triamcinolone acetonide (KENALOG-40) injection 20 mg  ?  ? ?Tendon Sheath Injection Procedure Note ?Kathy Acosta ?[birth year] ?Date of procedure: 02/05/2022 ? ?Procedure: Tendon Sheath Injection for Trigger Finger, L 1st ?Indications: Pain ? ?Procedure Details ?Verbal consent was obtained. Risks (including potential risk for skin lightening and potential atrophy), benefits and alternatives were discussed. Prepped with Chloraprep and Ethyl Chloride used for anesthesia. Under sterile conditions, patient injected at palmar crease aiming distally with 45 degree angle towards nodule; injected directly into tendon sheath. Medication flowed freely without resistance.   ?Needle size: 22 gauge 1 1/2 inch ?Injection: 1/2 cc of Lidocaine 1% and Kenalog 20 mg ?Medication: 1/2 cc of Kenalog 40 mg (equaling Kenalog 20 mg)  ? ?Meds ordered this encounter  ?Medications  ? triamcinolone acetonide (KENALOG-40) injection 20 mg  ? ?There are no discontinued medications. ?No orders of the defined types were placed in this encounter. ? ? ?Follow-up: No follow-ups on file. ? ?Dragon Medical One speech-to-text software was used for transcription in this dictation.  Possible transcriptional errors can occur using Editor, commissioning.  ? ?Signed, ? ?Daton Szilagyi T. Bretta Fees, MD ? ? ?Outpatient Encounter Medications as of 02/05/2022  ?Medication Sig  ? acetaminophen (TYLENOL) 650 MG CR tablet Take 1,300 mg by mouth every 8 (eight) hours as needed for pain.  ? amLODipine (NORVASC) 10 MG tablet TAKE 1 TABLET BY MOUTH  DAILY  ? ascorbic acid (VITAMIN C) 500 MG tablet Take 500 mg by mouth daily.  ? Chromium Picolinate (CHROMIUM PICOLATE PO) Take 1 capsule by mouth daily.  ? metFORMIN (GLUCOPHAGE-XR) 500 MG 24 hr tablet Take 1 tablet (500 mg total) by mouth daily with breakfast.  ? Multiple Minerals-Vitamins (MULTISOURCE CALCIUM MAG/D) TABS Take 1 tablet by mouth daily.  ? Multiple Vitamin (MULTIVITAMIN WITH MINERALS) TABS tablet Take 1 tablet by mouth daily.  ? Omega-3 Fatty Acids (FISH OIL PO) Take 1 capsule by mouth daily.  ? omeprazole (PRILOSEC) 20 MG capsule Take 1 capsule (20 mg total) by mouth daily.  ? Polyethyl Glycol-Propyl Glycol (SYSTANE OP) Place 1 drop into both eyes 3 (three) times daily.  ? potassium gluconate 595 (99 K) MG TABS tablet Take  595 mg by mouth daily.  ? valsartan-hydrochlorothiazide (DIOVAN-HCT) 160-12.5 MG tablet TAKE 1 TABLET BY MOUTH  DAILY  ? [EXPIRED] triamcinolone acetonide (KENALOG-40) injection 20 mg   ? ?No facility-administered encounter medications on file as of 02/05/2022.  ?  ?

## 2022-02-06 ENCOUNTER — Encounter: Payer: Self-pay | Admitting: Internal Medicine

## 2022-02-06 ENCOUNTER — Ambulatory Visit: Payer: Medicare Other | Admitting: Internal Medicine

## 2022-02-06 VITALS — BP 144/80 | HR 72 | Ht 62.0 in | Wt 162.2 lb

## 2022-02-06 DIAGNOSIS — I10 Essential (primary) hypertension: Secondary | ICD-10-CM | POA: Diagnosis not present

## 2022-02-06 DIAGNOSIS — I442 Atrioventricular block, complete: Secondary | ICD-10-CM | POA: Diagnosis not present

## 2022-02-06 DIAGNOSIS — R5383 Other fatigue: Secondary | ICD-10-CM

## 2022-02-06 DIAGNOSIS — R0609 Other forms of dyspnea: Secondary | ICD-10-CM

## 2022-02-06 NOTE — Progress Notes (Signed)
? ?Follow-up Outpatient Visit ?Date: 02/06/2022 ? ?Primary Care Provider: ?Venia Carbon, MD ?[address] ?[place] [address] ? ?Chief Complaint: Follow-up heart block ? ?HPI:  Kathy Acosta is a 76 y.o. 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  I met Kathy Acosta in early February, at which time she complained of extreme fatigue and shortness of breath for about 2 months.  She was noted to be bradycardic in the 40s with 2:1 AV block.  She subsequently underwent pacemaker placement with Dr. Quentin Ore. ? ?Today, Kathy Acosta reports that she is feeling much better following pacemaker placement.  She has been increasing her activity and is trying to walk regularly.  However, she still feels more fatigued when doing things than she is accustomed to.  She has not had any frank chest pain or tightness but gets out of breath with modest activity.  She has not had any palpitations, lightheadedness, or edema.  She has been very careful with her left arm and wonders if it would be okay to begin sleeping on her left side and to do more movements with her left arm following PPM placement in early February. ? ?-------------------------------------------------------------------------------------------------- ? ?Cardiovascular History & Procedures: ?Cardiovascular Problems: ?Heart block ?  ?Risk Factors: ?Hypertension and age > 30 ?  ?Cath/PCI: ?None ?  ?CV Surgery: ?None ?  ?EP Procedures and Devices: ?Dual-chamber pacemaker ([employer], 12/12/2021) ?  ?Non-Invasive Evaluation(s): ?TTE (12/11/2021): Normal LV size and wall thickness.  LVEF 70-75%.  GLS -24.3%.  Normal RV size and function.  Normal biatrial size.  Mild mitral and tricuspid regurgitation. ? ?Recent CV Pertinent Labs: ?Lab Results  ?Component Value Date  ? CHOL 159 12/03/2021  ? HDL 43.00 12/03/2021  ? [place] 81 12/03/2021  ?  LDLDIRECT 83.0 11/22/2018  ? TRIG 175.0 (H) 12/03/2021  ? CHOLHDL 4 12/03/2021  ? K 3.8 12/11/2021  ? BUN 18 12/11/2021  ? CREATININE 0.78 12/11/2021  ? ? ?Past medical and surgical history were reviewed and updated in EPIC. ? ?Current Meds  ?Medication Sig  ? acetaminophen (TYLENOL) 650 MG CR tablet Take 1,300 mg by mouth every 8 (eight) hours as needed for pain.  ? amLODipine (NORVASC) 10 MG tablet TAKE 1 TABLET BY MOUTH  DAILY  ? ascorbic acid (VITAMIN C) 500 MG tablet Take 500 mg by mouth daily.  ? Chromium Picolinate (CHROMIUM PICOLATE PO) Take 1 capsule by mouth daily.  ? metFORMIN (GLUCOPHAGE-XR) 500 MG 24 hr tablet Take 1 tablet (500 mg total) by mouth daily with breakfast.  ? Multiple Minerals-Vitamins (MULTISOURCE CALCIUM MAG/D) TABS Take 1 tablet by mouth daily.  ? Multiple Vitamin (MULTIVITAMIN WITH MINERALS) TABS tablet Take 1 tablet by mouth daily.  ? Omega-3 Fatty Acids (FISH OIL PO) Take 1 capsule by mouth daily.  ? omeprazole (PRILOSEC) 20 MG capsule Take 1 capsule (20 mg total) by mouth daily.  ? Polyethyl Glycol-Propyl Glycol (SYSTANE OP) Place 1 drop into both eyes 3 (three) times daily.  ? potassium gluconate 595 (99 K) MG TABS tablet Take 595 mg by mouth daily.  ? valsartan-hydrochlorothiazide (DIOVAN-HCT) 160-12.5 MG tablet TAKE 1 TABLET BY MOUTH  DAILY  ? ? ?Allergies: Doxazosin ? ?Social History  ? ?Tobacco Use  ? Smoking status: Never  ? Smokeless tobacco: Never  ?Vaping Use  ? Vaping Use: Never used  ?Substance Use Topics  ? Alcohol use: Yes  ?  Alcohol/week: 0.0 standard drinks  ?  Comment: wine - rare  ? Drug use: No  ? ? ?Family History  ?Problem Relation Age of Onset  ? Hypertension Mother   ? Hypertension Father   ? Diabetes Father   ? Diabetes Brother   ? Cancer Paternal Aunt   ?     colon  ? Colon cancer Paternal Aunt   ? Diabetes Brother   ? Heart disease Sister   ? Colon cancer Sister   ? Colon polyps Neg Hx   ? Esophageal cancer Neg Hx   ? Stomach cancer Neg Hx   ? Rectal cancer  Neg Hx   ? ? ?Review of Systems: ?A 12-system review of systems was performed and was negative except as noted in the HPI. ? ?-------------------------------------------------------------------------------------------------- ? ?Physical Exam: ?BP (!) 144/80 (BP Location: Left Arm, Patient Position: Sitting, Cuff Size: Normal)   Pulse 72   Ht _0  (1.575 m)   Wt 162 lb 4 oz (73.6 kg)   SpO2 98%   BMI 29.68 kg/m?  ? ?General:  NAD. ?Neck: No JVD or HJR. ?Lungs: Clear to auscultation bilaterally without wheezes or crackles. ?Heart: Regular rate and rhythm without murmurs, rubs, or gallops. ?Abdomen: Soft, nontender, nondistended. ?Extremities: No lower extremity edema. ?Skin: Left anterior chest wall pacemaker insertion site is well-healed.  No tenderness. ? ?EKG: Normal sinus rhythm with atrially sensed, ventricularly paced rhythm. ? ?Lab Results  ?Component Value Date  ? WBC 5.7 12/11/2021  ? HGB 12.5 12/11/2021  ? HCT 38.3 12/11/2021  ? MCV 84 12/11/2021  ? PLT 214 12/11/2021  ? ? ?Lab Results  ?Component Value Date  ? NA 141 12/11/2021  ? K 3.8 12/11/2021  ? CL 100 12/11/2021  ? CO2 25 12/11/2021  ? BUN 18 12/11/2021  ? CREATININE 0.78 12/11/2021  ? GLUCOSE 106 (H) 12/11/2021  ? ALT 35 12/03/2021  ? ? ?Lab Results  ?Component Value Date  ? CHOL 159 12/03/2021  ? HDL 43.00 12/03/2021  ? [place] 81 12/03/2021  ? LDLDIRECT 83.0 11/22/2018  ? TRIG 175.0 (H) 12/03/2021  ? CHOLHDL 4 12/03/2021  ? ? ?-------------------------------------------------------------------------------------------------- ? ?ASSESSMENT AND PLAN: ?Complete heart block: ?Significant improvement in symptoms following dual-chamber pacemaker placement in February by Dr. Quentin Ore.  Incision site has healed well.  I think it is reasonable for Kathy Acosta to begin increasing activity with her left arm as she is about 2 months out from pacemaker implantation.  Continue routine device clinic follow-up. ? ?Fatigue and exertional dyspnea: ?Though Ms.  Acosta has improved considerably following pacemaker implantation, she still feels like her breathing and stamina are not quite as good as they used to be.  Given her development of heart block and these symptoms, I think it would be reasonable to perform noninvasive ischemia evaluation with a pharmacologic myocardial perfusion stress test. ? ?Hypertension: ?Blood pressure mildly elevated today.  Defer medication changes today with ongoing follow-up through Dr. Silvio Pate. ? ?Shared Decision Making/Informed Consent ?The risks [chest pain, shortness of breath, cardiac arrhythmias, dizziness, blood pressure fluctuations, myocardial infarction, stroke/transient ischemic attack, nausea, vomiting, allergic reaction, radiation exposure, metallic taste sensation and life-threatening complications (estimated to be 1 in 10,000)], benefits (risk stratification, diagnosing coronary artery disease, treatment guidance) and alternatives of a nuclear stress test were discussed in detail with Kathy Acosta and she agrees to proceed. ? ?Follow-up: Return to clinic in 3 months. ? ?Nelva Bush, MD ?02/06/2022 ?11:51 AM ? ?

## 2022-02-06 NOTE — Patient Instructions (Signed)
Medication Instructions:  ? ?Your physician recommends that you continue on your current medications as directed. Please refer to the Current Medication list given to you today. ? ?*If you need a refill on your cardiac medications before your next appointment, please call your pharmacy* ? ? ?Lab Work: ? ?None ordered ? ?Testing/Procedures: ? ?[facility] MYOVIEW ? ?Your caregiver has ordered a Stress Test with nuclear imaging. The purpose of this test is to evaluate the blood supply to your heart muscle. This procedure is referred to as a "Non-Invasive Stress Test." This is because other than having an IV started in your vein, nothing is inserted or "invades" your body. Cardiac stress tests are done to find areas of poor blood flow to the heart by determining the extent of coronary artery disease (CAD). Some patients exercise on a treadmill, which naturally increases the blood flow to your heart, while others who are  unable to walk on a treadmill due to physical limitations have a pharmacologic/chemical stress agent called Lexiscan . This medicine will mimic walking on a treadmill by temporarily increasing your coronary blood flow.  ? ?Please note: these test may take anywhere between 2-4 hours to complete ? ?PLEASE REPORT TO [facility] MEDICAL MALL ENTRANCE  ?THE VOLUNTEERS AT THE FIRST DESK WILL DIRECT YOU WHERE TO GO ? ?Date of Procedure:_____________________________________ ? ?Arrival Time for Procedure:______________________________ ? ?Instructions regarding medication:  ? ?__X__ : Hold diabetes medication morning of procedure (Metformin) ? ? ?PLEASE NOTIFY THE OFFICE AT LEAST 73 HOURS IN ADVANCE IF YOU ARE UNABLE TO KEEP YOUR APPOINTMENT.  [phone] ?AND  ?PLEASE NOTIFY NUCLEAR MEDICINE AT [facility] AT LEAST 40 HOURS IN ADVANCE IF YOU ARE UNABLE TO KEEP YOUR APPOINTMENT. [phone] ? ?How to prepare for your Myoview test: ? ?Do not eat or drink after midnight ?No caffeine for 24 hours prior to test ?No smoking 24 hours  prior to test. ?Your medication may be taken with water.  If your doctor stopped a medication because of this test, do not take that medication. ?Ladies, please do not wear dresses.  Skirts or pants are appropriate. Please wear a short sleeve shirt. ?No perfume, cologne or lotion. ? ? ?Follow-Up: ?At [facility], you and your health needs are our priority.  As part of our continuing mission to provide you with exceptional heart care, we have created designated Provider Care Teams.  These Care Teams include your primary Cardiologist (physician) and Advanced Practice Providers (APPs -  Physician Assistants and Nurse Practitioners) who all work together to provide you with the care you need, when you need it. ? ?We recommend signing up for the patient portal called "MyChart".  Sign up information is provided on this After Visit Summary.  MyChart is used to connect with patients for Virtual Visits (Telemedicine).  Patients are able to view lab/test results, encounter notes, upcoming appointments, etc.  Non-urgent messages can be sent to your provider as well.   ?To learn more about what you can do with MyChart, go to [email or web address].   ? ?Your next appointment:   ?3 month(s) ? ?The format for your next appointment:   ?In Person ? ?Provider:   ?You may see Dr. Harrell Gave End or one of the following Advanced Practice Providers on your designated Care Team:   ?Murray Hodgkins, NP ?Christell Faith, PA-C ?Cadence Kathlen Mody, PA-C ?

## 2022-02-07 ENCOUNTER — Encounter: Payer: Self-pay | Admitting: Internal Medicine

## 2022-02-07 DIAGNOSIS — I442 Atrioventricular block, complete: Secondary | ICD-10-CM | POA: Insufficient documentation

## 2022-02-07 DIAGNOSIS — R5383 Other fatigue: Secondary | ICD-10-CM | POA: Insufficient documentation

## 2022-02-11 ENCOUNTER — Telehealth: Payer: Self-pay | Admitting: Internal Medicine

## 2022-02-11 ENCOUNTER — Encounter
Admission: RE | Admit: 2022-02-11 | Discharge: 2022-02-11 | Disposition: A | Discharge status: Home / Self Care | Payer: Medicare Other | Source: Ambulatory Visit | Attending: Internal Medicine | Admitting: Internal Medicine

## 2022-02-11 DIAGNOSIS — R5383 Other fatigue: Secondary | ICD-10-CM | POA: Insufficient documentation

## 2022-02-11 DIAGNOSIS — I442 Atrioventricular block, complete: Secondary | ICD-10-CM | POA: Insufficient documentation

## 2022-02-11 MED ORDER — TECHNETIUM TC 99M TETROFOSMIN IV KIT
10.0000 | PACK | Freq: Once | INTRAVENOUS | Status: AC | PRN
  Administered 2022-02-11: 10.35 via INTRAVENOUS

## 2022-02-11 NOTE — Telephone Encounter (Signed)
Attempted to reschedule lexi.   ? ?Patient declined for now and will call back when she is ready but states she is going to wait a while .  ? ? ? ?

## 2022-02-12 ENCOUNTER — Ambulatory Visit: Payer: Medicare Other | Admitting: Internal Medicine

## 2022-02-25 ENCOUNTER — Encounter
Admission: RE | Admit: 2022-02-25 | Discharge: 2022-02-25 | Disposition: A | Discharge status: Home / Self Care | Payer: Medicare Other | Source: Ambulatory Visit | Attending: Internal Medicine | Admitting: Internal Medicine

## 2022-02-25 DIAGNOSIS — R5383 Other fatigue: Secondary | ICD-10-CM

## 2022-02-25 DIAGNOSIS — I2510 Atherosclerotic heart disease of native coronary artery without angina pectoris: Secondary | ICD-10-CM | POA: Diagnosis not present

## 2022-02-25 DIAGNOSIS — I700 Atherosclerosis of aorta: Secondary | ICD-10-CM | POA: Diagnosis not present

## 2022-02-25 DIAGNOSIS — I442 Atrioventricular block, complete: Secondary | ICD-10-CM | POA: Insufficient documentation

## 2022-02-25 DIAGNOSIS — Z950 Presence of cardiac pacemaker: Secondary | ICD-10-CM | POA: Insufficient documentation

## 2022-02-25 LAB — NM MYOCAR MULTI W/SPECT W/WALL MOTION / EF
Estimated workload: 1
Exercise duration (min): 0 min
Exercise duration (sec): 0 s
LV dias vol: 40 mL (ref 46–106)
LV sys vol: 14 mL
MPHR: 143 {beats}/min
Nuc Stress EF: 65 %
Peak HR: 88 {beats}/min
Percent HR: 61 %
Rest HR: 65 {beats}/min
Rest Nuclear Isotope Dose: 10.8 mCi
SDS: 0
SRS: 5
SSS: 2
ST Depression (mm): 0 mm
Stress Nuclear Isotope Dose: 32 mCi
TID: 0.82

## 2022-02-25 MED ORDER — REGADENOSON 0.4 MG/5ML IV SOLN: 0.4000 mg | Freq: Once | INTRAVENOUS | Status: DC

## 2022-02-25 MED ORDER — TECHNETIUM TC 99M TETROFOSMIN IV KIT
31.9800 | PACK | Freq: Once | INTRAVENOUS | Status: AC | PRN
  Administered 2022-02-25: 31.98 via INTRAVENOUS

## 2022-02-25 MED ORDER — TECHNETIUM TC 99M TETROFOSMIN IV KIT
10.0000 | PACK | Freq: Once | INTRAVENOUS | Status: AC
  Administered 2022-02-25: 10.82 via INTRAVENOUS

## 2022-02-25 MED ORDER — REGADENOSON 0.4 MG/5ML IV SOLN
0.4000 mg | Freq: Once | INTRAVENOUS | Status: AC
  Administered 2022-02-25: 0.4 mg via INTRAVENOUS

## 2022-02-26 ENCOUNTER — Telehealth: Payer: Self-pay | Admitting: *Deleted

## 2022-02-26 MED ORDER — ROSUVASTATIN CALCIUM 5 MG PO TABS: 5.0000 mg | ORAL_TABLET | Freq: Every day | ORAL | 3 refills | Status: DC

## 2022-02-26 NOTE — Telephone Encounter (Signed)
Spoke with pt, notified of results.  ?Pt voiced understanding and is agreeable to begin rosuvastatin now.  ?Pt will start rosuvastatin 5 mg daily.  ?Rx has been sent to mail order pharmacy per pt request.  ?Pt will follow up as scheduled in July.  ? ?No further questions at this time.  ?

## 2022-02-26 NOTE — Telephone Encounter (Signed)
-----   Message from Nelva Bush, MD sent at 02/26/2022 11:28 AM EDT ----- ?Low risk study without ischemia or scar.  Coronary artery calcification and aortic atherosclerosis noted.  Given history of diabetes mellitus, as well as coronary artery calcification and aortic atherosclerosis, I recommend adding low to moderate-intensity statin therapy.  If Kathy Acosta is agreeable, I would suggest starting rosuvastatin 5 mg daily.  If she wishes to wait until our follow-up visit in order to discuss this in person, that is okay, too. ?

## 2022-03-14 ENCOUNTER — Ambulatory Visit (INDEPENDENT_AMBULATORY_CARE_PROVIDER_SITE_OTHER): Payer: Medicare Other

## 2022-03-14 DIAGNOSIS — I442 Atrioventricular block, complete: Secondary | ICD-10-CM | POA: Diagnosis not present

## 2022-03-14 LAB — CUP PACEART REMOTE DEVICE CHECK
Battery Remaining Longevity: 153 mo
Battery Voltage: 3.18 V
Brady Statistic AP VP Percent: 0.32 %
Brady Statistic AP VS Percent: 0 %
Brady Statistic AS VP Percent: 99.65 %
Brady Statistic AS VS Percent: 0.03 %
Brady Statistic RA Percent Paced: 0.33 %
Brady Statistic RV Percent Paced: 99.97 %
Date Time Interrogation Session: 20230511232509
Implantable Lead Implant Date: 20230209
Implantable Lead Implant Date: 20230209
Implantable Lead Location: 753859
Implantable Lead Location: 753860
Implantable Lead Model: 3830
Implantable Lead Model: 5076
Implantable Pulse Generator Implant Date: 20230209
Lead Channel Impedance Value: 418 Ohm
Lead Channel Impedance Value: 437 Ohm
Lead Channel Impedance Value: 532 Ohm
Lead Channel Impedance Value: 570 Ohm
Lead Channel Pacing Threshold Amplitude: 0.375 V
Lead Channel Pacing Threshold Amplitude: 0.75 V
Lead Channel Pacing Threshold Pulse Width: 0.4 ms
Lead Channel Pacing Threshold Pulse Width: 0.4 ms
Lead Channel Sensing Intrinsic Amplitude: 1.5 mV
Lead Channel Sensing Intrinsic Amplitude: 1.5 mV
Lead Channel Sensing Intrinsic Amplitude: 11.5 mV
Lead Channel Setting Pacing Amplitude: 1.5 V
Lead Channel Setting Pacing Amplitude: 2 V
Lead Channel Setting Pacing Pulse Width: 0.4 ms
Lead Channel Setting Sensing Sensitivity: 1.2 mV

## 2022-03-19 DIAGNOSIS — M3501 Sicca syndrome with keratoconjunctivitis: Secondary | ICD-10-CM | POA: Diagnosis not present

## 2022-03-20 NOTE — Progress Notes (Signed)
Remote pacemaker transmission.   

## 2022-03-26 ENCOUNTER — Encounter: Payer: Self-pay | Admitting: Cardiology

## 2022-03-26 ENCOUNTER — Ambulatory Visit (INDEPENDENT_AMBULATORY_CARE_PROVIDER_SITE_OTHER): Payer: Medicare Other | Admitting: Cardiology

## 2022-03-26 VITALS — BP 148/82 | HR 71 | Ht 62.0 in | Wt 160.0 lb

## 2022-03-26 DIAGNOSIS — Z950 Presence of cardiac pacemaker: Secondary | ICD-10-CM | POA: Diagnosis not present

## 2022-03-26 DIAGNOSIS — I10 Essential (primary) hypertension: Secondary | ICD-10-CM

## 2022-03-26 DIAGNOSIS — I442 Atrioventricular block, complete: Secondary | ICD-10-CM

## 2022-03-26 LAB — PACEMAKER DEVICE OBSERVATION

## 2022-03-26 NOTE — Patient Instructions (Signed)
Medications: Your physician recommends that you continue on your current medications as directed. Please refer to the Current Medication list given to you today. *If you need a refill on your cardiac medications before your next appointment, please call your pharmacy*  Lab Work: None. If you have labs (blood work) drawn today and your tests are completely normal, you will receive your results only by: MyChart Message (if you have MyChart) OR A paper copy in the mail If you have any lab test that is abnormal or we need to change your treatment, we will call you to review the results.  Testing/Procedures: None.  Follow-Up: At [facility], you and your health needs are our priority.  As part of our continuing mission to provide you with exceptional heart care, we have created designated Provider Care Teams.  These Care Teams include your primary Cardiologist (physician) and Advanced Practice Providers (APPs -  Physician Assistants and Nurse Practitioners) who all work together to provide you with the care you need, when you need it.  Your physician wants you to follow-up in: 12 months with Cameron Lambert, MD     You will receive a reminder letter in the mail two months in advance. If you don't receive a letter, please call our office to schedule the follow-up appointment.  We recommend signing up for the patient portal called "MyChart".  Sign up information is provided on this After Visit Summary.  MyChart is used to connect with patients for Virtual Visits (Telemedicine).  Patients are able to view lab/test results, encounter notes, upcoming appointments, etc.  Non-urgent messages can be sent to your provider as well.   To learn more about what you can do with MyChart, go to [email or web address].    Any Other Special Instructions Will Be Listed Below (If Applicable).  

## 2022-03-26 NOTE — Progress Notes (Signed)
Electrophysiology Office Follow up Visit Note:    Date:  03/26/2022   ID:  Kathy Acosta, DOB 08-Aug-1946, MRN [id number]  PCP:  Venia Carbon, MD  [facility] Cardiologist:  Nelva Bush, MD  [facility] Electrophysiologist:  Vickie Epley, MD    Interval History:    Kathy Acosta is a 76 y.o. female who presents for a follow up visit after a dual chamber permanent pacemaker on 12/12/2021 for CHB. Remote checks since implant have shown stable device function.   She is felt better since pacemaker implant with improved energy level.  Device is healed well.     Past Medical History:  Diagnosis Date   Arthritis    Cataract    bilateral repair   Essential tremor    last 2 years   Hx of colonic polyps    Hypertension    Impaired fasting glucose     Past Surgical History:  Procedure Laterality Date   ABDOMINAL HYSTERECTOMY     CATARACT EXTRACTION W/PHACO Right 07/12/2020   Procedure: CATARACT EXTRACTION PHACO AND INTRAOCULAR LENS PLACEMENT ([place]) RIGHT;  Surgeon: Birder Robson, MD;  Location: [place];  Service: Ophthalmology;  Laterality: Right;  7.35 0:46.4   CATARACT EXTRACTION W/PHACO Left 07/31/2020   Procedure: CATARACT EXTRACTION PHACO AND INTRAOCULAR LENS PLACEMENT ([place]) LEFT;  Surgeon: Birder Robson, MD;  Location: [place];  Service: Ophthalmology;  Laterality: Left;  6.27 0:36.5   CERVICAL DISCECTOMY  1992   2 times   CESAREAN SECTION     2 times   COLONOSCOPY  2016   PACEMAKER IMPLANT N/A 12/12/2021   Procedure: PACEMAKER IMPLANT;  Surgeon: Vickie Epley, MD;  Location: [place] [facility];  Service: Cardiovascular;  Laterality: N/A;    Current Medications: Current Meds  Medication Sig   acetaminophen (TYLENOL) 650 MG CR tablet Take 1,300 mg by mouth every 8 (eight) hours as needed for pain.   amLODipine (NORVASC) 10 MG tablet TAKE 1 TABLET BY MOUTH  DAILY   ascorbic acid (VITAMIN C) 500 MG tablet Take 500 mg by  mouth daily.   Chromium Picolinate (CHROMIUM PICOLATE PO) Take 1 capsule by mouth daily.   metFORMIN (GLUCOPHAGE-XR) 500 MG 24 hr tablet Take 1 tablet (500 mg total) by mouth daily with breakfast.   Multiple Minerals-Vitamins (MULTISOURCE CALCIUM MAG/D) TABS Take 1 tablet by mouth daily.   Multiple Vitamin (MULTIVITAMIN WITH MINERALS) TABS tablet Take 1 tablet by mouth daily.   Omega-3 Fatty Acids (FISH OIL PO) Take 1 capsule by mouth daily.   omeprazole (PRILOSEC) 20 MG capsule Take 1 capsule (20 mg total) by mouth daily.   Polyethyl Glycol-Propyl Glycol (SYSTANE OP) Place 1 drop into both eyes 3 (three) times daily.   potassium gluconate 595 (99 K) MG TABS tablet Take 595 mg by mouth daily.   rosuvastatin (CRESTOR) 5 MG tablet Take 1 tablet (5 mg total) by mouth daily.   valsartan-hydrochlorothiazide (DIOVAN-HCT) 160-12.5 MG tablet TAKE 1 TABLET BY MOUTH  DAILY     Allergies:   Doxazosin   Social History   Socioeconomic History   Marital status: Married    Spouse name: Not on file   Number of children: 2   Years of education: Not on file   Highest education level: Not on file  Occupational History   Occupation: retired- Games developer[employer]  Tobacco Use   Smoking status: Never   Smokeless tobacco: Never  Vaping Use   Vaping Use:  Never used  Substance and Sexual Activity   Alcohol use: Yes    Alcohol/week: 0.0 standard drinks    Comment: wine - rare   Drug use: No   Sexual activity: Never    Birth control/protection: Surgical  Other Topics Concern   Not on file  Social History Narrative   No living will   Requests sons to be health care POA   Would like attempts at resuscitation.   Probably wouldn't want tube feeds if cognitively unaware   Social Determinants of Health   Financial Resource Strain: Not on file  Food Insecurity: Not on file  Transportation Needs: Not on file  Physical Activity: Not on file  Stress: Not on file  Social Connections: Not on  file     Family History: The patient's family history includes Cancer in her paternal aunt; Colon cancer in her paternal aunt and sister; Diabetes in her brother, brother, and father; Heart disease in her sister; Hypertension in her father and mother. There is no history of Colon polyps, Esophageal cancer, Stomach cancer, or Rectal cancer.  ROS:   Please see the history of present illness.    All other systems reviewed and are negative.  EKGs/Labs/Other Studies Reviewed:    The following studies were reviewed today:  03/26/2022 in clinic device interrogation personally reviewed. Battery longevity 12.7 years Presenting rhythm a sensed, V paced at 70 bpm.  Underlying rhythm is complete heart block with ventricular escape in the 30s Ventricular pacing 100% Atrial pacing 0.4% DDD 60-1 30   EKG:  The ekg ordered today demonstrates a sensed, V paced.  QRS duration 150 ms.  Recent Labs: 12/03/2021: ALT 35 12/11/2021: BUN 18; Creatinine, Ser 0.78; Hemoglobin 12.5; Platelets 214; Potassium 3.8; Sodium 141  Recent Lipid Panel    Component Value Date/Time   CHOL 159 12/03/2021 1115   TRIG 175.0 (H) 12/03/2021 1115   HDL 43.00 12/03/2021 1115   CHOLHDL 4 12/03/2021 1115   VLDL 35.0 12/03/2021 1115   LDLCALC 81 12/03/2021 1115   LDLDIRECT 83.0 11/22/2018 1602    Physical Exam:    VS:  BP (!) 148/82   Pulse 71   Ht '5\' 2"'$  (1.575 m)   Wt 160 lb (72.6 kg)   SpO2 96%   BMI 29.26 kg/m     Wt Readings from Last 3 Encounters:  03/26/22 160 lb (72.6 kg)  02/06/22 162 lb 4 oz (73.6 kg)  02/05/22 160 lb 5 oz (72.7 kg)     GEN:  Well nourished, well developed in no acute distress HEENT: Normal NECK: No JVD; No carotid bruits LYMPHATICS: No lymphadenopathy CARDIAC: RRR, no murmurs, rubs, gallops.  Pacemaker pocket well-healed RESPIRATORY:  Clear to auscultation without rales, wheezing or rhonchi  ABDOMEN: Soft, non-tender, non-distended MUSCULOSKELETAL:  No edema; No deformity  SKIN:  Warm and dry NEUROLOGIC:  Alert and oriented x 3 PSYCHIATRIC:  Normal affect        ASSESSMENT:    1. Complete heart block ([place])   2. Essential hypertension   3. Cardiac pacemaker in situ    PLAN:    In order of problems listed above:   #Complete heart block #Pacemaker in situ Device functioning appropriately.  100% ventricular paced.  Continue remote monitoring.  #Hypertension Slightly above goal today.  Follows with Dr. Silvio Pate.  She should take her blood pressures 1-2 times per week at least at home and record these values.  She should bring these recordings to her primary care physician  at the next appointment.  For now, continue amlodipine and Diovan.  Follow-up in 1 year.   Medication Adjustments/Labs and Tests Ordered: Current medicines are reviewed at length with the patient today.  Concerns regarding medicines are outlined above.  Orders Placed This Encounter  Procedures   EKG 12-Lead   No orders of the defined types were placed in this encounter.    Signed, Lars Mage, MD, [facility], [facility] 03/26/2022 9:21 AM    Electrophysiology [facility]

## 2022-05-14 ENCOUNTER — Ambulatory Visit: Payer: Medicare Other | Admitting: Internal Medicine

## 2022-05-14 ENCOUNTER — Encounter: Payer: Self-pay | Admitting: Internal Medicine

## 2022-05-14 VITALS — BP 160/80 | HR 77 | Ht 62.0 in | Wt 159.0 lb

## 2022-05-14 DIAGNOSIS — I10 Essential (primary) hypertension: Secondary | ICD-10-CM | POA: Diagnosis not present

## 2022-05-14 DIAGNOSIS — E785 Hyperlipidemia, unspecified: Secondary | ICD-10-CM | POA: Diagnosis not present

## 2022-05-14 DIAGNOSIS — Z950 Presence of cardiac pacemaker: Secondary | ICD-10-CM | POA: Diagnosis not present

## 2022-05-14 DIAGNOSIS — I442 Atrioventricular block, complete: Secondary | ICD-10-CM | POA: Diagnosis not present

## 2022-05-14 DIAGNOSIS — I2584 Coronary atherosclerosis due to calcified coronary lesion: Secondary | ICD-10-CM | POA: Diagnosis not present

## 2022-05-14 DIAGNOSIS — I2510 Atherosclerotic heart disease of native coronary artery without angina pectoris: Secondary | ICD-10-CM | POA: Diagnosis not present

## 2022-05-14 DIAGNOSIS — I700 Atherosclerosis of aorta: Secondary | ICD-10-CM | POA: Diagnosis not present

## 2022-05-14 MED ORDER — VALSARTAN-HYDROCHLOROTHIAZIDE 160-12.5 MG PO TABS: 2.0000 | ORAL_TABLET | Freq: Every day | ORAL | Status: DC

## 2022-05-14 NOTE — Progress Notes (Signed)
Follow-up Outpatient Visit Date: 05/14/2022  Primary Care Provider: Venia Carbon, MD [place] [address]  Chief Complaint: Follow-up hypertension and complete heart block  HPI:  Ms. Gannett is a 76 y.o. female with history of complete heart block status post pacemaker (12/2021), hypertension, impaired glucose tolerance, and arthritis, who presents for follow-up of complete heart block.  I last saw her in April, at which time she was feeling much better following PPM placement in February.  Due to some continued fatigue at our last visit, we agreed to obtain a pharmacologic MPI; the study was normal.  I recommended addition of rosuvastatin, given coronary artery calcification and aortic atherosclerosis noted on the attenuation correction CT.  She was feeling well at the time of her follow-up visit with Dr. Quentin Ore in May.  PPM was functioning appropriately.  Today, Ms. Swartzentruber reports that she has been feeling fairly well.  She had a very busy day yesterday and was quite fatigued by the [place] of the day.  Overall, she feels much better since implantation of her pacemaker in February.  She denies chest pain, shortness of breath, palpitations, lightheadedness, and edema.  She walks some in the mall due to the heat outside but has not been exercising as much as she used to.  She is tolerating her medications well, including rosuvastatin that was started after her stress test in April.  --------------------------------------------------------------------------------------------------  Cardiovascular History & Procedures: Cardiovascular Problems: Heart block Coronary artery calcification and aortic atherosclerosis   Risk Factors: Hypertension and age > 9   Cath/PCI: None   CV Surgery: None   EP Procedures and Devices: Dual-chamber pacemaker ([employer], 12/12/2021)   Non-Invasive Evaluation(s): Pharmacologic MPI (02/25/2022): Low risk study without ischemia or  scar.  LVEF > 65%.  Coronary artery calcification and aortic atherosclerosis noted. TTE (12/11/2021): Normal LV size and wall thickness.  LVEF 70-75%.  GLS -24.3%.  Normal RV size and function.  Normal biatrial size.  Mild mitral and tricuspid regurgitation.  Recent CV Pertinent Labs: Lab Results  Component Value Date   CHOL 159 12/03/2021   HDL 43.00 12/03/2021   LDLCALC 81 12/03/2021   LDLDIRECT 83.0 11/22/2018   TRIG 175.0 (H) 12/03/2021   CHOLHDL 4 12/03/2021   K 3.8 12/11/2021   BUN 18 12/11/2021   CREATININE 0.78 12/11/2021    Past medical and surgical history were reviewed and updated in EPIC.  Current Meds  Medication Sig   acetaminophen (TYLENOL) 650 MG CR tablet Take 1,300 mg by mouth every 8 (eight) hours as needed for pain.   amLODipine (NORVASC) 10 MG tablet TAKE 1 TABLET BY MOUTH  DAILY   ascorbic acid (VITAMIN C) 500 MG tablet Take 500 mg by mouth daily.   Chromium Picolinate (CHROMIUM PICOLATE PO) Take 1 capsule by mouth daily.   metFORMIN (GLUCOPHAGE-XR) 500 MG 24 hr tablet Take 1 tablet (500 mg total) by mouth daily with breakfast.   Multiple Minerals-Vitamins (MULTISOURCE CALCIUM MAG/D) TABS Take 1 tablet by mouth daily.   Multiple Vitamin (MULTIVITAMIN WITH MINERALS) TABS tablet Take 1 tablet by mouth daily.   Omega-3 Fatty Acids (FISH OIL PO) Take 1 capsule by mouth daily.   omeprazole (PRILOSEC) 20 MG capsule Take 1 capsule (20 mg total) by mouth daily.   potassium gluconate 595 (99 K) MG TABS tablet Take 595 mg by mouth daily.   rosuvastatin (CRESTOR) 5 MG tablet Take 1 tablet (5 mg total) by mouth daily.   valsartan-hydrochlorothiazide (DIOVAN-HCT)  160-12.5 MG tablet TAKE 1 TABLET BY MOUTH  DAILY    Allergies: Doxazosin  Social History   Tobacco Use   Smoking status: Never   Smokeless tobacco: Never  Vaping Use   Vaping Use: Never used  Substance Use Topics   Alcohol use: Yes    Comment: glass of wine - once a month   Drug use: No    Family  History  Problem Relation Age of Onset   Hypertension Mother    Hypertension Father    Diabetes Father    Diabetes Brother    Cancer Paternal Aunt        colon   Colon cancer Paternal Aunt    Diabetes Brother    Heart disease Sister    Colon cancer Sister    Colon polyps Neg Hx    Esophageal cancer Neg Hx    Stomach cancer Neg Hx    Rectal cancer Neg Hx     Review of Systems: A 12-system review of systems was performed and was negative except as noted in the HPI.  --------------------------------------------------------------------------------------------------  Physical Exam: BP (!) 160/80 (BP Location: Left Arm, Patient Position: Sitting, Cuff Size: Large)   Temp (!) 77 F (25 C)   Ht '5\' 2"'$  (1.575 m)   Wt 159 lb (72.1 kg)   SpO2 98%   BMI 29.08 kg/m  Repeat BP 154/80  General:  NAD. Neck: No JVD or HJR. Lungs: Clear to auscultation bilaterally without wheezes or crackles. Heart: Regular rate and rhythm without murmurs, rubs, or gallops. Abdomen: Soft, nontender, nondistended. Extremities: No lower extremity edema.   Lab Results  Component Value Date   WBC 5.7 12/11/2021   HGB 12.5 12/11/2021   HCT 38.3 12/11/2021   MCV 84 12/11/2021   PLT 214 12/11/2021    Lab Results  Component Value Date   NA 141 12/11/2021   K 3.8 12/11/2021   CL 100 12/11/2021   CO2 25 12/11/2021   BUN 18 12/11/2021   CREATININE 0.78 12/11/2021   GLUCOSE 106 (H) 12/11/2021   ALT 35 12/03/2021    Lab Results  Component Value Date   CHOL 159 12/03/2021   HDL 43.00 12/03/2021   LDLCALC 81 12/03/2021   LDLDIRECT 83.0 11/22/2018   TRIG 175.0 (H) 12/03/2021   CHOLHDL 4 12/03/2021    --------------------------------------------------------------------------------------------------  ASSESSMENT AND PLAN: Complete heart block: Ms. Harth is status post dual-chamber pacemaker, which is functioning appropriately.  Continue monitoring through Dr. Quentin Ore and the device  clinic.  Coronary artery calcification, aortic atherosclerosis, and hyperlipidemia: No angina reported.  Rosuvastatin was added after stress test demonstrated these findings.  We will obtain a CMP and lipid panel in about 2 weeks to assess response.  Hypertension: Blood pressure suboptimally controlled today, as at prior visits.  We have agreed to increase valsartan-HCTZ to 320-25 mg daily (she can take 2 of her current pills once daily pending labs; we will send in a prescription for higher dose if she tolerates it well and labs remain within normal limits).  Amlodipine 10 mg daily will be continued.  I will have Ms. Yore return in about 2 weeks for a CMP to assess renal function and potassium.  I encouraged her to increase her activity and to continue with sodium restriction.  Follow-up: Return to clinic in 3 months.  Nelva Bush, MD 05/14/2022 10:07 AM

## 2022-05-14 NOTE — Patient Instructions (Signed)
Medication Instructions:   Your physician has recommended you make the following change in your medication:   INCREASE Valsartan-HCTZ 320-25 mg daily - You may take 2 tablets of current dose at this time   *If you need a refill on your cardiac medications before your next appointment, please call your pharmacy*   Lab Work:  Your physician recommends that you return for FASTING lab work in: 2 WEEKS (Lipid panel / [place])  -  Please go to the [place] at [place] in at the Registration Desk: 1st desk to the right, past the screening table -  [place] is offered if needed -  No appointment needed -  Lab hours: Monday- Friday (7:30 am- 5:30 pm)    Testing/Procedures:  None ordered   Follow-Up: At [facility], you and your health needs are our priority.  As part of our continuing mission to provide you with exceptional heart care, we have created designated Provider Care Teams.  These Care Teams include your primary Cardiologist (physician) and Advanced Practice Providers (APPs -  Physician Assistants and Nurse Practitioners) who all work together to provide you with the care you need, when you need it.  We recommend signing up for the patient portal called "MyChart".  Sign up information is provided on this After Visit Summary.  MyChart is used to connect with patients for Virtual Visits (Telemedicine).  Patients are able to view lab/test results, encounter notes, upcoming appointments, etc.  Non-urgent messages can be sent to your provider as well.   To learn more about what you can do with MyChart, go to [email or web address].    Your next appointment:   3 month(s)  The format for your next appointment:   In Person  Provider:   You may see Nelva Bush, MD or one of the following Advanced Practice Providers on your designated Care Team:   Murray Hodgkins, NP Christell Faith, PA-C Cadence Kathlen Mody, PA-C{  Important Information About Sugar

## 2022-05-15 ENCOUNTER — Encounter: Payer: Self-pay | Admitting: Cardiology

## 2022-05-30 ENCOUNTER — Other Ambulatory Visit
Admission: RE | Admit: 2022-05-30 | Discharge: 2022-05-30 | Disposition: A | Discharge status: Home / Self Care | Payer: Medicare Other | Attending: Internal Medicine | Admitting: Internal Medicine

## 2022-05-30 DIAGNOSIS — I10 Essential (primary) hypertension: Secondary | ICD-10-CM | POA: Insufficient documentation

## 2022-05-30 DIAGNOSIS — I700 Atherosclerosis of aorta: Secondary | ICD-10-CM | POA: Insufficient documentation

## 2022-05-30 LAB — LIPID PANEL
Cholesterol: 137 mg/dL (ref 0–200)
HDL: 50 mg/dL (ref >40)
LDL Cholesterol: 64 mg/dL (ref 0–99)
Total CHOL/HDL Ratio: 2.7 RATIO
Triglycerides: 113 mg/dL (ref <150)
VLDL: 23 mg/dL (ref 0–40)

## 2022-05-30 LAB — COMPREHENSIVE METABOLIC PANEL
ALT: 38 U/L (ref 0–44)
AST: 36 U/L (ref 15–41)
Albumin: 4.1 g/dL (ref 3.5–5.0)
Alkaline Phosphatase: 56 U/L (ref 38–126)
Anion gap: 8 (ref 5–15)
BUN: 24 mg/dL — ABNORMAL HIGH (ref 8–23)
CO2: 31 mmol/L (ref 22–32)
Calcium: 9.8 mg/dL (ref 8.9–10.3)
Chloride: 101 mmol/L (ref 98–111)
Creatinine, Ser: 0.83 mg/dL (ref 0.44–1.00)
GFR, Estimated: >60 mL/min (ref >60)
Glucose, Bld: 119 mg/dL — ABNORMAL HIGH (ref 70–99)
Potassium: 4 mmol/L (ref 3.5–5.1)
Sodium: 140 mmol/L (ref 135–145)
Total Bilirubin: 0.7 mg/dL (ref 0.3–1.2)
Total Protein: 7.7 g/dL (ref 6.5–8.1)

## 2022-06-06 ENCOUNTER — Telehealth: Payer: Self-pay | Admitting: Internal Medicine

## 2022-06-06 MED ORDER — VALSARTAN-HYDROCHLOROTHIAZIDE 320-25 MG PO TABS: 1.0000 | ORAL_TABLET | Freq: Every day | ORAL | 0 refills | Status: DC

## 2022-06-06 NOTE — Telephone Encounter (Signed)
Pt calling stating she hasn't heard anything back about her lab results and what to do next about her medication. Informed her that a message was sent through my chart but she would like a phone call.

## 2022-06-06 NOTE — Telephone Encounter (Signed)
Spoke to pt, notified that Dr. Saunders Revel had sent results to her mychart directly and happy to review over the phone.  Pt notified of lab results and voiced understanding.  New Rx sent to mail order pharmacy for recent incr dose of valsartan-hctz 320-25 mg daily.  Pt originally was taking 2 tablets of valsartan-hctz 160-12.5 mg daily until lab results.   Pt appreciative and has no further questions at this time.

## 2022-06-13 ENCOUNTER — Ambulatory Visit (INDEPENDENT_AMBULATORY_CARE_PROVIDER_SITE_OTHER): Payer: Medicare Other

## 2022-06-13 DIAGNOSIS — I442 Atrioventricular block, complete: Secondary | ICD-10-CM | POA: Diagnosis not present

## 2022-06-13 LAB — CUP PACEART REMOTE DEVICE CHECK
Battery Remaining Longevity: 149 mo
Battery Voltage: 3.14 V
Brady Statistic AP VP Percent: 0.63 %
Brady Statistic AP VS Percent: 0 %
Brady Statistic AS VP Percent: 98.91 %
Brady Statistic AS VS Percent: 0.46 %
Brady Statistic RA Percent Paced: 0.87 %
Brady Statistic RV Percent Paced: 99.54 %
Date Time Interrogation Session: 20230810193705
Implantable Lead Implant Date: 20230209
Implantable Lead Implant Date: 20230209
Implantable Lead Location: 753859
Implantable Lead Location: 753860
Implantable Lead Model: 3830
Implantable Lead Model: 5076
Implantable Pulse Generator Implant Date: 20230209
Lead Channel Impedance Value: 418 Ohm
Lead Channel Impedance Value: 437 Ohm
Lead Channel Impedance Value: 494 Ohm
Lead Channel Impedance Value: 570 Ohm
Lead Channel Pacing Threshold Amplitude: 0.375 V
Lead Channel Pacing Threshold Amplitude: 0.875 V
Lead Channel Pacing Threshold Pulse Width: 0.4 ms
Lead Channel Pacing Threshold Pulse Width: 0.4 ms
Lead Channel Sensing Intrinsic Amplitude: 1.875 mV
Lead Channel Sensing Intrinsic Amplitude: 1.875 mV
Lead Channel Sensing Intrinsic Amplitude: 11.375 mV
Lead Channel Setting Pacing Amplitude: 1.5 V
Lead Channel Setting Pacing Amplitude: 2 V
Lead Channel Setting Pacing Pulse Width: 0.4 ms
Lead Channel Setting Sensing Sensitivity: 1.2 mV

## 2022-07-08 NOTE — Progress Notes (Signed)
Remote pacemaker transmission.   

## 2022-07-22 ENCOUNTER — Other Ambulatory Visit: Payer: Self-pay | Admitting: Internal Medicine

## 2022-08-07 ENCOUNTER — Other Ambulatory Visit: Payer: Self-pay | Admitting: Internal Medicine

## 2022-08-13 ENCOUNTER — Ambulatory Visit (INDEPENDENT_AMBULATORY_CARE_PROVIDER_SITE_OTHER): Payer: Medicare Other

## 2022-08-13 DIAGNOSIS — Z23 Encounter for immunization: Secondary | ICD-10-CM | POA: Diagnosis not present

## 2022-08-14 ENCOUNTER — Ambulatory Visit: Payer: Medicare Other | Admitting: Internal Medicine

## 2022-08-14 ENCOUNTER — Encounter: Payer: Self-pay | Admitting: Internal Medicine

## 2022-08-14 ENCOUNTER — Ambulatory Visit: Payer: Medicare Other | Attending: Internal Medicine | Admitting: Internal Medicine

## 2022-08-14 ENCOUNTER — Other Ambulatory Visit
Admission: RE | Admit: 2022-08-14 | Discharge: 2022-08-14 | Disposition: A | Discharge status: Home / Self Care | Payer: Medicare Other | Attending: Internal Medicine | Admitting: Internal Medicine

## 2022-08-14 VITALS — BP 154/80 | HR 79 | Ht 62.0 in | Wt 160.0 lb

## 2022-08-14 DIAGNOSIS — I1A0 Resistant hypertension: Secondary | ICD-10-CM | POA: Diagnosis not present

## 2022-08-14 DIAGNOSIS — I2584 Coronary atherosclerosis due to calcified coronary lesion: Secondary | ICD-10-CM

## 2022-08-14 DIAGNOSIS — I442 Atrioventricular block, complete: Secondary | ICD-10-CM | POA: Diagnosis not present

## 2022-08-14 DIAGNOSIS — I10 Essential (primary) hypertension: Secondary | ICD-10-CM | POA: Insufficient documentation

## 2022-08-14 DIAGNOSIS — I2510 Atherosclerotic heart disease of native coronary artery without angina pectoris: Secondary | ICD-10-CM

## 2022-08-14 DIAGNOSIS — E785 Hyperlipidemia, unspecified: Secondary | ICD-10-CM | POA: Diagnosis not present

## 2022-08-14 NOTE — Patient Instructions (Signed)
Medication Instructions:   Your physician recommends that you continue on your current medications as directed. Please refer to the Current Medication list given to you today.  *If you need a refill on your cardiac medications before your next appointment, please call your pharmacy*   Lab Work:  Your physician recommends you go to the medical mall to have lab work completed today.   If you have labs (blood work) drawn today and your tests are completely normal, you will receive your results only by: [place] (if you have MyChart) OR A paper copy in the mail If you have any lab test that is abnormal or we need to change your treatment, we will call you to review the results.   Testing/Procedures:  Your physician has requested that you have a renal artery duplex. During this test, an ultrasound is used to evaluate blood flow to the kidneys. Allow one hour for this exam. Do not eat after midnight the day before and avoid carbonated beverages. Take your medications as you usually do.    Follow-Up: At [facility], you and your health needs are our priority.  As part of our continuing mission to provide you with exceptional heart care, we have created designated Provider Care Teams.  These Care Teams include your primary Cardiologist (physician) and Advanced Practice Providers (APPs -  Physician Assistants and Nurse Practitioners) who all work together to provide you with the care you need, when you need it.  We recommend signing up for the patient portal called "MyChart".  Sign up information is provided on this After Visit Summary.  MyChart is used to connect with patients for Virtual Visits (Telemedicine).  Patients are able to view lab/test results, encounter notes, upcoming appointments, etc.  Non-urgent messages can be sent to your provider as well.   To learn more about what you can do with MyChart, go to [email or web address].    Your next appointment:   6  week(s)  The format for your next appointment:   In Person  Provider:   You may see Nelva Bush, MD or one of the following Advanced Practice Providers on your designated Care Team:   Murray Hodgkins, NP Christell Faith, PA-C Cadence Kathlen Mody, PA-C Gerrie Nordmann, NP

## 2022-08-14 NOTE — Progress Notes (Signed)
Follow-up Outpatient Visit Date: 08/14/2022  Primary Care Provider: Venia Carbon, MD [place] [address]  Chief Complaint: Left arm pain  HPI:  Kathy Acosta is a 76 y.o. 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  I last saw her in July, which time she was feeling fairly well though she noted some fatigue when she was very active.  Blood pressure was noted to be moderately elevated at our visit, prompting escalation of valsartan-HCTZ to 320-25 mg daily.  Today, Kathy Acosta is most concerned about pain in her left arm that began over a month ago.  It is present all the time but waxes and wanes in intensity.  It worsens with certain movements and is also somewhat tender to palpation.  She does not recall a specific injury that precipitated this.  From a heart standpoint, Kathy Acosta is doing well, denying chest pain, shortness of breath, palpitations, lightheadedness, or edema.  She has not had any issues with her pacemaker.  She notes that her home blood pressures remain elevated, typically 140-160/70-90.  She is compliant with her medications.  Rash that  --------------------------------------------------------------------------------------------------  Cardiovascular History & Procedures: Cardiovascular Problems: Heart block Coronary artery calcification and aortic atherosclerosis   Risk Factors: Hypertension and age > 44   Cath/PCI: None   CV Surgery: None   EP Procedures and Devices: Dual-chamber pacemaker ([employer]tronic, 12/12/2021)   Non-Invasive Evaluation(s): Pharmacologic MPI (02/25/2022): Low risk study without ischemia or scar.  LVEF > 65%.  Coronary artery calcification and aortic atherosclerosis noted. TTE (12/11/2021): Normal LV size and wall thickness.  LVEF 70-75%.  GLS -24.3%.  Normal RV size and function.  Normal  biatrial size.  Mild mitral and tricuspid regurgitation.  Recent CV Pertinent Labs: Lab Results  Component Value Date   CHOL 137 05/30/2022   HDL 50 05/30/2022   LDLCALC 64 05/30/2022   LDLDIRECT 83.0 11/22/2018   TRIG 113 05/30/2022   CHOLHDL 2.7 05/30/2022   K 4.0 05/30/2022   BUN 24 (H) 05/30/2022   BUN 18 12/11/2021   CREATININE 0.83 05/30/2022    Past medical and surgical history were reviewed and updated in EPIC.  Current Meds  Medication Sig   acetaminophen (TYLENOL) 650 MG CR tablet Take 1,300 mg by mouth every 8 (eight) hours as needed for pain.   amLODipine (NORVASC) 10 MG tablet TAKE 1 TABLET BY MOUTH  DAILY   ascorbic acid (VITAMIN C) 500 MG tablet Take 500 mg by mouth daily.   Chromium Picolinate (CHROMIUM PICOLATE PO) Take 1 capsule by mouth daily.   metFORMIN (GLUCOPHAGE-XR) 500 MG 24 hr tablet Take 1 tablet (500 mg total) by mouth daily with breakfast.   Multiple Minerals-Vitamins (MULTISOURCE CALCIUM MAG/D) TABS Take 1 tablet by mouth daily.   Multiple Vitamin (MULTIVITAMIN WITH MINERALS) TABS tablet Take 1 tablet by mouth daily.   Omega-3 Fatty Acids (FISH OIL PO) Take 1 capsule by mouth daily.   omeprazole (PRILOSEC) 20 MG capsule Take 1 capsule (20 mg total) by mouth daily.   potassium gluconate 595 (99 K) MG TABS tablet Take 595 mg by mouth daily.   rosuvastatin (CRESTOR) 5 MG tablet Take 1 tablet (5 mg total) by mouth daily.   valsartan-hydrochlorothiazide (DIOVAN-HCT) 320-25 MG tablet Take 1 tablet by mouth daily.    Allergies: Doxazosin  Social History   Tobacco Use   Smoking status:  Never   Smokeless tobacco: Never  Vaping Use   Vaping Use: Never used  Substance Use Topics   Alcohol use: Yes    Comment: glass of wine - once a month   Drug use: No    Family History  Problem Relation Age of Onset   Hypertension Mother    Hypertension Father    Diabetes Father    Diabetes Brother    Cancer Paternal Aunt        colon   Colon cancer  Paternal Aunt    Diabetes Brother    Heart disease Sister    Colon cancer Sister    Colon polyps Neg Hx    Esophageal cancer Neg Hx    Stomach cancer Neg Hx    Rectal cancer Neg Hx     Review of Systems: A 12-system review of systems was performed and was negative except as noted in the HPI.  --------------------------------------------------------------------------------------------------  Physical Exam: BP (!) 160/90 (BP Location: Left Arm, Patient Position: Sitting, Cuff Size: Large)   Pulse 79   Ht '5\' 2"'$  (1.575 m)   Wt 160 lb (72.6 kg)   SpO2 97%   BMI 29.26 kg/m  Repeat BP: 154/80  General:  NAD. Neck: No JVD or HJR. Lungs: Clear to auscultation bilaterally without wheezes or crackles. Heart: Regular rate and rhythm without murmurs, rubs, or gallops. Abdomen: Soft, nontender, nondistended. Extremities: No lower extremity edema.  EKG: Normal sinus rhythm with atrially sensed and ventricularly paced rhythm.  Lab Results  Component Value Date   WBC 5.7 12/11/2021   HGB 12.5 12/11/2021   HCT 38.3 12/11/2021   MCV 84 12/11/2021   PLT 214 12/11/2021    Lab Results  Component Value Date   NA 140 05/30/2022   K 4.0 05/30/2022   CL 101 05/30/2022   CO2 31 05/30/2022   BUN 24 (H) 05/30/2022   CREATININE 0.83 05/30/2022   GLUCOSE 119 (H) 05/30/2022   ALT 38 05/30/2022    Lab Results  Component Value Date   CHOL 137 05/30/2022   HDL 50 05/30/2022   LDLCALC 64 05/30/2022   LDLDIRECT 83.0 11/22/2018   TRIG 113 05/30/2022   CHOLHDL 2.7 05/30/2022    --------------------------------------------------------------------------------------------------  ASSESSMENT AND PLAN: Complete heart block: EKG today demonstrates normal sinus rhythm with atrially sensed and ventricularly paced rhythm.  Kathy Acosta has not had any symptoms to suggest pacemaker malfunction.  Continue ongoing follow-up through the device clinic.  Resistant hypertension: Blood pressure remains  poorly controlled despite escalation of valsartan-HCTZ at our last visit.  Kathy Acosta is trying to limit her sodium intake.  Given her moderately elevated blood pressure despite being on maximal doses of 3 different agents, I think it would be prudent to perform a secondary hypertension work-up.  We will begin with a renal artery Doppler and serum aldosterone:plasma renin activity ratio.  Based on results of the latter test, we will consider addition of spironolactone.  I have encouraged Kathy Acosta to continue minimizing her sodium intake and to remain active.  Coronary artery calcification and hyperlipidemia: No angina reported.  Lipids well controlled on most recent check in July.  Continue rosuvastatin 5 mg daily.  Follow-up: Return to clinic in 6-8 weeks.  Please  Nelva Bush, MD 08/14/2022 10:26 AM

## 2022-08-19 LAB — ALDOSTERONE + RENIN ACTIVITY W/ RATIO
ALDO / PRA Ratio: 39 — ABNORMAL HIGH (ref 0.0–30.0)
Aldosterone: 11.3 ng/dL (ref 0.0–30.0)
PRA LC/MS/MS: 0.29 ng/mL/hr (ref 0.167–5.380)

## 2022-08-25 ENCOUNTER — Ambulatory Visit: Payer: Medicare Other | Attending: Internal Medicine

## 2022-08-25 DIAGNOSIS — I1A0 Resistant hypertension: Secondary | ICD-10-CM | POA: Diagnosis not present

## 2022-08-25 DIAGNOSIS — I10 Essential (primary) hypertension: Secondary | ICD-10-CM

## 2022-08-28 ENCOUNTER — Telehealth: Payer: Self-pay | Admitting: Internal Medicine

## 2022-08-28 NOTE — Telephone Encounter (Signed)
Patient is returning call for results. Please advise  

## 2022-08-29 NOTE — Telephone Encounter (Signed)
Patient was returning call. Please advise ?

## 2022-08-29 NOTE — Telephone Encounter (Signed)
Renal duplex results shared with patient as per Dr. Darnelle Bos note. TDS  Attempted to call results to patient. No answer or answering machine. TDS

## 2022-09-05 ENCOUNTER — Telehealth: Payer: Self-pay | Admitting: Internal Medicine

## 2022-09-05 ENCOUNTER — Encounter: Payer: Self-pay | Admitting: *Deleted

## 2022-09-05 NOTE — Telephone Encounter (Signed)
Merlene Laughter, RN 08/22/2022  9:51 AM EDT     Left message for patient to call office.

## 2022-09-05 NOTE — Telephone Encounter (Signed)
The patient called back. I have advised her of her lab results and Dr. Darnelle Bos recommendations to: 1) START spironolactone 25 mg once daily 2) Repeat a BMP 1 week after her starting the medication 3) See an endocrinologist  The patient voices understanding of these results and recommendations. Per the patient, she would like to hold off on these recommendations for now and discuss further with Dr. Saunders Revel at her next appointment on 10/09/22,

## 2022-09-05 NOTE — Telephone Encounter (Signed)
Patient returned RN's call. 

## 2022-09-05 NOTE — Telephone Encounter (Signed)
Donnalee Curry K 08/28/2022  2:33 PM EDT     Left message at both numbers on file for patient to call back for results.

## 2022-09-05 NOTE — Telephone Encounter (Signed)
3rd attempt to contact the patient. No answer at her home or cell #- I left a message to please call back.   Letter mailed to the patient to please contact the office.

## 2022-09-05 NOTE — Telephone Encounter (Signed)
Nelva Bush, MD 08/20/2022  2:03 PM EDT     Please let Ms. Condon know that her serum aldosterone/plasma renin activity ratio is elevated, which may be contributing to her difficult to control hypertension.  I recommend that we start spironolactone 25 mg daily, stop her potassium supplement, and have her get a BMP drawn in 1 week from starting the spironolactone.  I also recommend that we refer her to endocrinology for further evaluation of hyperaldosteronism.

## 2022-09-12 ENCOUNTER — Ambulatory Visit (INDEPENDENT_AMBULATORY_CARE_PROVIDER_SITE_OTHER): Payer: Medicare Other

## 2022-09-12 DIAGNOSIS — I442 Atrioventricular block, complete: Secondary | ICD-10-CM

## 2022-09-13 LAB — CUP PACEART REMOTE DEVICE CHECK
Battery Remaining Longevity: 146 mo
Battery Voltage: 3.09 V
Brady Statistic AP VP Percent: 0.6 %
Brady Statistic AP VS Percent: 0 %
Brady Statistic AS VP Percent: 99.25 %
Brady Statistic AS VS Percent: 0.16 %
Brady Statistic RA Percent Paced: 0.67 %
Brady Statistic RV Percent Paced: 99.84 %
Date Time Interrogation Session: 20231110025903
Implantable Lead Connection Status: 753985
Implantable Lead Connection Status: 753985
Implantable Lead Implant Date: 20230209
Implantable Lead Implant Date: 20230209
Implantable Lead Location: 753859
Implantable Lead Location: 753860
Implantable Lead Model: 3830
Implantable Lead Model: 5076
Implantable Pulse Generator Implant Date: 20230209
Lead Channel Impedance Value: 399 Ohm
Lead Channel Impedance Value: 437 Ohm
Lead Channel Impedance Value: 456 Ohm
Lead Channel Impedance Value: 551 Ohm
Lead Channel Pacing Threshold Amplitude: 0.25 V
Lead Channel Pacing Threshold Amplitude: 0.875 V
Lead Channel Pacing Threshold Pulse Width: 0.4 ms
Lead Channel Pacing Threshold Pulse Width: 0.4 ms
Lead Channel Sensing Intrinsic Amplitude: 12.125 mV
Lead Channel Sensing Intrinsic Amplitude: 12.125 mV
Lead Channel Sensing Intrinsic Amplitude: 2.5 mV
Lead Channel Sensing Intrinsic Amplitude: 2.5 mV
Lead Channel Setting Pacing Amplitude: 1.5 V
Lead Channel Setting Pacing Amplitude: 2 V
Lead Channel Setting Pacing Pulse Width: 0.4 ms
Lead Channel Setting Sensing Sensitivity: 1.2 mV
Zone Setting Status: 755011

## 2022-09-17 NOTE — Progress Notes (Signed)
Remote pacemaker transmission.   

## 2022-09-22 ENCOUNTER — Other Ambulatory Visit: Payer: Self-pay | Admitting: Internal Medicine

## 2022-10-08 ENCOUNTER — Other Ambulatory Visit: Payer: Self-pay | Admitting: Internal Medicine

## 2022-10-08 DIAGNOSIS — Z1231 Encounter for screening mammogram for malignant neoplasm of breast: Secondary | ICD-10-CM

## 2022-10-09 ENCOUNTER — Ambulatory Visit: Payer: Medicare Other | Attending: Internal Medicine | Admitting: Internal Medicine

## 2022-10-09 ENCOUNTER — Encounter: Payer: Self-pay | Admitting: Internal Medicine

## 2022-10-09 ENCOUNTER — Other Ambulatory Visit
Admission: RE | Admit: 2022-10-09 | Discharge: 2022-10-09 | Disposition: A | Discharge status: Home / Self Care | Payer: Medicare Other | Source: Ambulatory Visit | Attending: Internal Medicine | Admitting: Internal Medicine

## 2022-10-09 VITALS — BP 160/90 | HR 76 | Ht 62.0 in | Wt 160.0 lb

## 2022-10-09 DIAGNOSIS — I152 Hypertension secondary to endocrine disorders: Secondary | ICD-10-CM

## 2022-10-09 DIAGNOSIS — I442 Atrioventricular block, complete: Secondary | ICD-10-CM

## 2022-10-09 DIAGNOSIS — E269 Hyperaldosteronism, unspecified: Secondary | ICD-10-CM | POA: Diagnosis not present

## 2022-10-09 DIAGNOSIS — Z950 Presence of cardiac pacemaker: Secondary | ICD-10-CM | POA: Diagnosis not present

## 2022-10-09 DIAGNOSIS — Z79899 Other long term (current) drug therapy: Secondary | ICD-10-CM | POA: Insufficient documentation

## 2022-10-09 LAB — BASIC METABOLIC PANEL
Anion gap: 8 (ref 5–15)
BUN: 22 mg/dL (ref 8–23)
CO2: 27 mmol/L (ref 22–32)
Calcium: 9.1 mg/dL (ref 8.9–10.3)
Chloride: 104 mmol/L (ref 98–111)
Creatinine, Ser: 0.82 mg/dL (ref 0.44–1.00)
GFR, Estimated: >60 mL/min (ref >60)
Glucose, Bld: 112 mg/dL — ABNORMAL HIGH (ref 70–99)
Potassium: 3.4 mmol/L — ABNORMAL LOW (ref 3.5–5.1)
Sodium: 139 mmol/L (ref 135–145)

## 2022-10-09 MED ORDER — SPIRONOLACTONE 25 MG PO TABS: 25.0000 mg | ORAL_TABLET | Freq: Every day | ORAL | 3 refills | Status: DC

## 2022-10-09 NOTE — Progress Notes (Signed)
Follow-up Outpatient Visit Date: 10/09/2022  Primary Care Provider: Venia Carbon, MD [place] [address]  Chief Complaint: Follow-up hypertension and heart block  HPI:  Kathy Acosta is a 76 y.o. 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  I last saw her in mid October, at which time she was doing well from a heart standpoint though she continued to have elevated blood pressures at home and in the office.  Renal artery Doppler was negative.  However, serum aldosterone/plasma renin activity ratio was elevated at 39.  I recommended adding spironolactone and referring Kathy Acosta to endocrinology, though our office was never able to get in touch with her to discuss these findings and recommendation.  Today, Kathy Acosta is feeling well.  She has some fatigue but overall thinks that she is doing okay.  She denies chest pain, shortness of breath, palpitations, lightheadedness, and edema.  Home blood pressures are typically mildly elevated, worse in the mornings when she first gets up.  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 Pharmacologic MPI (02/25/2022): Low risk study without ischemia or scar.  LVEF > 65%.  Coronary artery calcification and aortic atherosclerosis  noted. TTE (12/11/2021): Normal LV size and wall thickness.  LVEF 70-75%.  GLS -24.3%.  Normal RV size and function.  Normal biatrial size.  Mild mitral and tricuspid regurgitation.  Recent CV Pertinent Labs: Lab Results  Component Value Date   CHOL 137 05/30/2022   HDL 50 05/30/2022   LDLCALC 64 05/30/2022   LDLDIRECT 83.0 11/22/2018   TRIG 113 05/30/2022   CHOLHDL 2.7 05/30/2022   K 4.0 05/30/2022   BUN 24 (H) 05/30/2022   BUN 18 12/11/2021   CREATININE 0.83 05/30/2022    Past medical and surgical history were reviewed and updated in EPIC.  Current Meds  Medication Sig   acetaminophen (TYLENOL) 650 MG CR tablet Take 1,300 mg by mouth every 8 (eight) hours as needed for pain.   amLODipine (NORVASC) 10 MG tablet TAKE 1 TABLET BY MOUTH  DAILY   ascorbic acid (VITAMIN C) 500 MG tablet Take 500 mg by mouth daily.   Chromium Picolinate (CHROMIUM PICOLATE PO) Take 1 capsule by mouth daily.   metFORMIN (GLUCOPHAGE-XR) 500 MG 24 hr tablet TAKE 1 TABLET BY MOUTH DAILY  WITH BREAKFAST   Multiple Minerals-Vitamins (MULTISOURCE CALCIUM MAG/D) TABS Take 1 tablet by mouth daily.   Multiple Vitamin (MULTIVITAMIN WITH MINERALS) TABS tablet Take 1 tablet by mouth daily.   Omega-3 Fatty Acids (FISH OIL PO) Take 1 capsule by mouth daily.   omeprazole (PRILOSEC) 20 MG capsule Take 1 capsule (20 mg total) by mouth daily.   potassium gluconate 595 (99 K) MG TABS tablet Take 595 mg by mouth daily.   rosuvastatin (CRESTOR) 5 MG tablet Take 1 tablet (5 mg total) by mouth daily.   valsartan-hydrochlorothiazide (DIOVAN-HCT) 320-25 MG tablet Take 1 tablet by mouth daily.    Allergies: Doxazosin  Social History   Tobacco Use   Smoking status: Never  Smokeless tobacco: Never  Vaping Use   Vaping Use: Never used  Substance Use Topics   Alcohol use: Yes    Comment: glass of wine - once a month   Drug use: No    Family History  Problem Relation Age of Onset   Hypertension Mother    Hypertension  Father    Diabetes Father    Diabetes Brother    Cancer Paternal Aunt        colon   Colon cancer Paternal Aunt    Diabetes Brother    Heart disease Sister    Colon cancer Sister    Colon polyps Neg Hx    Esophageal cancer Neg Hx    Stomach cancer Neg Hx    Rectal cancer Neg Hx     Review of Systems: A 12-system review of systems was performed and was negative except as noted in the HPI.  --------------------------------------------------------------------------------------------------  Physical Exam: BP (!) 160/90 (BP Location: Left Arm, Patient Position: Sitting, Cuff Size: Large)   Pulse 76   Ht '5\' 2"'$  (1.575 m)   Wt 160 lb (72.6 kg)   SpO2 97%   BMI 29.26 kg/m   General:  NAD. Neck: No JVD or HJR. Lungs: Clear to auscultation bilaterally without wheezes or crackles. Heart: Regular rate and rhythm without murmurs, rubs, or gallops. Abdomen: Soft, nontender, nondistended. Extremities: No lower extremity edema.   Lab Results  Component Value Date   WBC 5.7 12/11/2021   HGB 12.5 12/11/2021   HCT 38.3 12/11/2021   MCV 84 12/11/2021   PLT 214 12/11/2021    Lab Results  Component Value Date   NA 140 05/30/2022   K 4.0 05/30/2022   CL 101 05/30/2022   CO2 31 05/30/2022   BUN 24 (H) 05/30/2022   CREATININE 0.83 05/30/2022   GLUCOSE 119 (H) 05/30/2022   ALT 38 05/30/2022    Lab Results  Component Value Date   CHOL 137 05/30/2022   HDL 50 05/30/2022   LDLCALC 64 05/30/2022   LDLDIRECT 83.0 11/22/2018   TRIG 113 05/30/2022   CHOLHDL 2.7 05/30/2022    --------------------------------------------------------------------------------------------------  ASSESSMENT AND PLAN: Complete heart block status post pacemaker: Kathy Acosta is doing well.  No symptoms reported.  Continue follow-up with Dr. Quentin Ore and the device clinic.  Hypertension and hyperaldosteronism: Recent labs revealed elevated serum aldosterone to plasma renin activity ratio.  As Kathy Acosta's  blood pressure remains suboptimally controlled despite being on 3 antihypertensive agents, we will add spironolactone 25 mg daily.  She should stop her over-the-counter potassium supplement.  I will check a BMP today and again in 1 week to ensure stable renal function and potassium.  We will also refer Kathy Acosta to endocrinology for further workup.  Coronary artery calcification and hyperlipidemia: Continue rosuvastatin 5 mg daily; LDL well-controlled on last check in July.  Follow-up: Return to clinic in 3 months.  Nelva Bush, MD 10/09/2022 9:57 AM

## 2022-10-09 NOTE — Patient Instructions (Signed)
Medication Instructions:  START TAKING: Spirolactone 25 mg daily  STOP TAKING: Potassium   *If you need a refill on your cardiac medications before your next appointment, please call your pharmacy*   Lab Work: Your provider would like for you to have the following labs drawn today (BMP), then in 1 week: (BMP).   Please go to the [facility] entrance and check in at the front desk.  You do not need an appointment.  They are open from 7am-6 pm.  You will not need to be fasting.  If you have labs (blood work) drawn today and your tests are completely normal, you will receive your results only by: [place] (if you have MyChart) OR A paper copy in the mail If you have any lab test that is abnormal or we need to change your treatment, we will call you to review the results.   Testing/Procedures: None ordered today   Follow-Up: At [facility], you and your health needs are our priority.  As part of our continuing mission to provide you with exceptional heart care, we have created designated Provider Care Teams.  These Care Teams include your primary Cardiologist (physician) and Advanced Practice Providers (APPs -  Physician Assistants and Nurse Practitioners) who all work together to provide you with the care you need, when you need it.  We recommend signing up for the patient portal called "MyChart".  Sign up information is provided on this After Visit Summary.  MyChart is used to connect with patients for Virtual Visits (Telemedicine).  Patients are able to view lab/test results, encounter notes, upcoming appointments, etc.  Non-urgent messages can be sent to your provider as well.   To learn more about what you can do with MyChart, go to [email or web address].    Your next appointment:   3 month(s)  The format for your next appointment:   In Person  Provider:   You may see Nelva Bush, MD or one of the following Advanced Practice Providers on your  designated Care Team:   Murray Hodgkins, NP Christell Faith, PA-C Cadence Kathlen Mody, PA-C Gerrie Nordmann, NP

## 2022-10-30 ENCOUNTER — Other Ambulatory Visit
Admission: RE | Admit: 2022-10-30 | Discharge: 2022-10-30 | Disposition: A | Discharge status: Home / Self Care | Payer: Medicare Other | Attending: Internal Medicine | Admitting: Internal Medicine

## 2022-10-30 DIAGNOSIS — Z79899 Other long term (current) drug therapy: Secondary | ICD-10-CM | POA: Diagnosis not present

## 2022-10-30 LAB — BASIC METABOLIC PANEL
Anion gap: 8 (ref 5–15)
BUN: 21 mg/dL (ref 8–23)
CO2: 29 mmol/L (ref 22–32)
Calcium: 9.8 mg/dL (ref 8.9–10.3)
Chloride: 102 mmol/L (ref 98–111)
Creatinine, Ser: 0.93 mg/dL (ref 0.44–1.00)
GFR, Estimated: >60 mL/min (ref >60)
Glucose, Bld: 142 mg/dL — ABNORMAL HIGH (ref 70–99)
Potassium: 3.9 mmol/L (ref 3.5–5.1)
Sodium: 139 mmol/L (ref 135–145)

## 2022-11-04 ENCOUNTER — Encounter: Payer: Medicare Other | Admitting: Internal Medicine

## 2022-11-06 ENCOUNTER — Telehealth: Payer: Self-pay | Admitting: Internal Medicine

## 2022-11-06 DIAGNOSIS — Z79899 Other long term (current) drug therapy: Secondary | ICD-10-CM

## 2022-11-06 NOTE — Telephone Encounter (Signed)
Pt made aware of lab results along with MD's recommendations. Pt verbalized understanding Repeat lab order placed  Nelva Bush, MD 10/30/2022  2:06 PM EST     Please let Ms. Raether know that her kidney functio and potassium are normal.  I recommend that she continue her current medications (including spironolactone) and have a repeat BMP drawn in ~1 month

## 2022-11-06 NOTE — Telephone Encounter (Signed)
Patient is returning call to discuss lab results. She requested a call back on her mobile line.

## 2022-11-10 ENCOUNTER — Telehealth: Payer: Self-pay | Admitting: Internal Medicine

## 2022-11-10 NOTE — Telephone Encounter (Signed)
Pt c/o medication issue:  1. Name of Medication: Spironolactone  2. How are you currently taking this medication (dosage and times per day)? 1 daily  3. Are you having a reaction (difficulty breathing--STAT)?   4. What is your medication issue? Patient wants to know the side effects of this medicine- she said she have had a severe headache for the past 2 days

## 2022-11-10 NOTE — Telephone Encounter (Signed)
I spoke with the patient. She states she has had a head ache x 3 days that has been "pounding." She has tried tylenol for symptoms with no real relief.  Her BP on Saturday was 140/86, she did not check this yesterday, but today she was 160/94.  Per EPIC, the patient's SBP will run in the 160's. She was concerned this may be related to spironolactone. She has been on this for ~ 1 month. I advised the patient that in my search of micro medex, I could not find a headache listed as a side effect of spironolactone.  I have advised the patient, if tylenol is not working, she may also try some saline nasal spray to insure her sinuses are well moistened. However, if symptoms persist, I have advised her to seek evaluation by her PCP or Urgent Care as a persistent headache can be a side effect of COVID as well.   The patient denies any other symptoms at this time. She voices understanding of the above and is agreeable.  She was very appreciative of the call back.

## 2022-11-11 ENCOUNTER — Other Ambulatory Visit: Payer: Self-pay

## 2022-11-11 ENCOUNTER — Emergency Department
Admission: EM | Admit: 2022-11-11 | Discharge: 2022-11-11 | Disposition: A | Discharge status: Home / Self Care | Payer: Medicare Other | Attending: Emergency Medicine | Admitting: Emergency Medicine

## 2022-11-11 ENCOUNTER — Emergency Department: Payer: Medicare Other

## 2022-11-11 DIAGNOSIS — I10 Essential (primary) hypertension: Secondary | ICD-10-CM | POA: Insufficient documentation

## 2022-11-11 DIAGNOSIS — Z950 Presence of cardiac pacemaker: Secondary | ICD-10-CM | POA: Insufficient documentation

## 2022-11-11 DIAGNOSIS — R519 Headache, unspecified: Secondary | ICD-10-CM

## 2022-11-11 DIAGNOSIS — Z79899 Other long term (current) drug therapy: Secondary | ICD-10-CM | POA: Insufficient documentation

## 2022-11-11 LAB — BASIC METABOLIC PANEL
Anion gap: 8 (ref 5–15)
BUN: 21 mg/dL (ref 8–23)
CO2: 29 mmol/L (ref 22–32)
Calcium: 9.9 mg/dL (ref 8.9–10.3)
Chloride: 100 mmol/L (ref 98–111)
Creatinine, Ser: 0.75 mg/dL (ref 0.44–1.00)
GFR, Estimated: >60 mL/min (ref >60)
Glucose, Bld: 114 mg/dL — ABNORMAL HIGH (ref 70–99)
Potassium: 4.4 mmol/L (ref 3.5–5.1)
Sodium: 137 mmol/L (ref 135–145)

## 2022-11-11 LAB — CBC
HCT: 41.4 % (ref 36.0–46.0)
Hemoglobin: 13.3 g/dL (ref 12.0–15.0)
MCH: 27.5 pg (ref 26.0–34.0)
MCHC: 32.1 g/dL (ref 30.0–36.0)
MCV: 85.5 fL (ref 80.0–100.0)
Platelets: 238 10*3/uL (ref 150–400)
RBC: 4.84 MIL/uL (ref 3.87–5.11)
RDW: 13.9 % (ref 11.5–15.5)
WBC: 5.4 10*3/uL (ref 4.0–10.5)
nRBC: 0 % (ref 0.0–0.2)

## 2022-11-11 LAB — SEDIMENTATION RATE: Sed Rate: 17 mm/hr (ref 0–30)

## 2022-11-11 MED ORDER — SODIUM CHLORIDE 0.9 % IV BOLUS
500.0000 mL | Freq: Once | INTRAVENOUS | Status: AC
Start: 2022-11-11 — End: 2022-11-11
  Administered 2022-11-11: 500 mL via INTRAVENOUS

## 2022-11-11 MED ORDER — KETOROLAC TROMETHAMINE 15 MG/ML IJ SOLN
15.0000 mg | Freq: Once | INTRAMUSCULAR | Status: AC
  Administered 2022-11-11: 15 mg via INTRAVENOUS
  Filled 2022-11-11: qty 1

## 2022-11-11 MED ORDER — ACETAMINOPHEN 500 MG PO TABS
1000.0000 mg | ORAL_TABLET | Freq: Once | ORAL | Status: AC
  Administered 2022-11-11: 1000 mg via ORAL
  Filled 2022-11-11: qty 2

## 2022-11-11 MED ORDER — DIPHENHYDRAMINE HCL 50 MG/ML IJ SOLN
12.5000 mg | Freq: Once | INTRAMUSCULAR | Status: AC
  Administered 2022-11-11: 12.5 mg via INTRAVENOUS
  Filled 2022-11-11: qty 1

## 2022-11-11 MED ORDER — PROCHLORPERAZINE EDISYLATE 10 MG/2ML IJ SOLN
10.0000 mg | Freq: Once | INTRAMUSCULAR | Status: AC
  Administered 2022-11-11: 10 mg via INTRAVENOUS
  Filled 2022-11-11: qty 2

## 2022-11-11 MED ORDER — MAGNESIUM SULFATE 2 GM/50ML IV SOLN
2.0000 g | Freq: Once | INTRAVENOUS | Status: AC
  Administered 2022-11-11: 2 g via INTRAVENOUS
  Filled 2022-11-11: qty 50

## 2022-11-11 NOTE — ED Notes (Signed)
First Nurse Note: Patient sent to ED from [place] for a headache x3 days. Headache noted to be left-sided. Negative covid/ flu test per [facility].

## 2022-11-11 NOTE — ED Triage Notes (Signed)
Pt presents to ED with c/o of headache that has been ongoing for a few days. Pt states HX of hypertension, pt states she takes meds for hypertension. Pt denies injury or trauma.

## 2022-11-11 NOTE — ED Provider Triage Note (Signed)
Emergency Medicine Provider Triage Evaluation Note  Kathy Acosta , a 77 y.o. female  was evaluated in triage.  Pt complains of was evaluated in triage.  Pt complains of Left-sided headache.  Patient reports onset of headache about 3 days ago.  She has had intermittent episodes of sharp pain to the left parietal region with referral to the left face and neck patient denies any associated vision changes, vertigo, tinnitus, weakness.  She denies any significant benefit with Tylenol..  Review of Systems  Positive: Left-sided headache Negative: FCS, NVD  Physical Exam  BP (!) 167/95 (BP Location: Right Arm)   Pulse 81   Temp 97.8 F (36.6 C) (Oral)   Resp 17   SpO2 99%  Gen:   Awake, no distress   Resp:  Normal effort  MSK:   Moves extremities without difficulty  Other:    Medical Decision Making  Medically screening exam initiated at 12:07 PM.  Appropriate orders placed.  Kathy Acosta was informed that the remainder of the evaluation will be completed by another provider, this initial triage assessment does not replace that evaluation, and the importance of remaining in the ED until their evaluation is complete.  Geriatric patient to the ED for evaluation of left-sided headache for the last several days.   Kathy Needles, PA-C 11/11/22 1208

## 2022-11-11 NOTE — ED Provider Notes (Signed)
Care assumed of patient from outgoing provider.  See their note for initial history, exam and plan.  Clinical Course as of 11/11/22 1518  Tue Nov 11, 2022  1517 Presents to the emergency department with headache.  Concern for likely primary headache.  ESR normal low suspicion for temporal arteritis.  No concern for meningitis or subarachnoid hemorrhage.  No concern for need of lumbar puncture.  Given migraine cocktail.  Plan to reevaluate for improvement of headache and possible discharge with carbamazepine for trigeminal neuralgia.  Given information for neurology follow-up. [SM]    Clinical Course User Index [SM] Nathaniel Man, MD    Headache resolved, patient will be discharged.  Follow-up with primary care physician and neurology.  Given return precautions for any worsening symptoms.   Nathaniel Man, MD 11/11/22 1615

## 2022-11-11 NOTE — ED Provider Notes (Signed)
[facility] Provider Note    Event Date/Time   First MD Initiated Contact with Patient 11/11/22 1356     (approximate)   History   Headache   HPI  Kathy Acosta is a 77 y.o. female   Past medical history of arthritis, hypertension, complete heart block with pacemaker placement, hyperlipidemia presents to the emergency department with left-sided headache.  4 days progressively worsening.    No vision changes nausea or vomiting.  No recent illnesses.  No other acute medical complaints.  No history of frequent headaches or migraines.  She has no focal neurologic complaints motor or sensory changes vision changes.  When trying to think of an inciting event she did have a minor head injury by bumping her forehead wall but this was 2 days before symptom onset.  The pain is constant but there are times where the pain becomes sharp and shooting on the side of her face.  History was obtained via patient.  Her husband is an independent historian who corroborates information given above.      Physical Exam   Triage Vital Signs: ED Triage Vitals [11/11/22 1202]  Enc Vitals Group     BP (!) 167/95     Pulse Rate 81     Resp 17     Temp 97.8 F (36.6 C)     Temp Source Oral     SpO2 99 %     Weight      Height      Head Circumference      Peak Flow      Pain Score 5     Pain Loc      Pain Edu?      Excl. in [place]?     Most recent vital signs: Vitals:   11/11/22 1202  BP: (!) 167/95  Pulse: 81  Resp: 17  Temp: 97.8 F (36.6 C)  SpO2: 99%    General: Awake, no distress.  CV:  Good peripheral perfusion.  Resp:  Normal effort.  Abd:  No distention.  Other:  No focal neurologic deficits including facial asymmetry dysarthria visual acuity, extraocular movements, motor or sensory.  There are no obvious lesions of the ears or face or eyes or nose, neck is supple with full range of motion, awake alert oriented and pleasant.  Mild hypertension  otherwise hemodynamics are appropriate and reassuring.  She has no significant tenderness to palpation of the temporal area or hyperesthesia in the face.  No obvious signs of trauma.   ED Results / Procedures / Treatments   Labs (all labs ordered are listed, but only abnormal results are displayed) Labs Reviewed  BASIC METABOLIC PANEL - Abnormal; Notable for the following components:      Result Value   Glucose, Bld 114 (*)    All other components within normal limits  CBC  SEDIMENTATION RATE     I reviewed labs and they are notable for no elevated white blood cell count.  Her ESR is 17 within normal limits   RADIOLOGY I independently reviewed and interpreted CT scan of the head without contrast and see no obvious bleeding or midline shift.   PROCEDURES:  Critical Care performed: No  Procedures   MEDICATIONS ORDERED IN ED: Medications  acetaminophen (TYLENOL) tablet 1,000 mg (has no administration in time range)  ketorolac (TORADOL) 15 MG/ML injection 15 mg (has no administration in time range)  magnesium sulfate IVPB 2 g 50 mL (has no administration in  time range)  prochlorperazine (COMPAZINE) injection 10 mg (has no administration in time range)  diphenhydrAMINE (BENADRYL) injection 12.5 mg (has no administration in time range)  sodium chloride 0.9 % bolus 500 mL (has no administration in time range)     IMPRESSION / MDM / ASSESSMENT AND PLAN / ED COURSE  I reviewed the triage vital signs and the nursing notes.                              Differential diagnosis includes, but is not limited to, intracranial bleeding, SAH, stroke, trigeminal neuralgia or migraine headache, tension type headache  MDM: This is a patient with left-sided headache for the last 4 days progressive in nature no obvious connected trauma the negative CT scan.  Very low suspicion subarachnoid hemorrhage, defer LP at this time given low suspicion and risk benefit of this procedure favors  deferring at this time in my clinical judgment.  She has no focal neurological deficits to suggest CVA.  She has no visual changes and her sed rate is normal so I doubt this is temporal arteritis or vascular inflammatory pathologies.  Most likely migraine versus trigeminal neuralgia given left-sided pain and sometimes paroxysms of severe shooting pain.  Trial migraine cocktail in the emergency department.  If refractory, can discharge with a prescription of carbamazepine '100mg'$  BID and have her follow-up with neurologist for further evaluation.    Patient's presentation is most consistent with acute presentation with potential threat to life or bodily function.       FINAL CLINICAL IMPRESSION(S) / ED DIAGNOSES   Final diagnoses:  Acute nonintractable headache, unspecified headache type     Rx / DC Orders   ED Discharge Orders     None        Note:  This document was prepared using Dragon voice recognition software and may include unintentional dictation errors.    Lucillie Garfinkel, MD 11/11/22 [phone]

## 2022-11-17 ENCOUNTER — Telehealth: Payer: Self-pay

## 2022-11-17 NOTE — Telephone Encounter (Signed)
Called to check on pt after recent ER visit for headache. She said she was doing a lot better.

## 2022-11-19 DIAGNOSIS — R519 Headache, unspecified: Secondary | ICD-10-CM | POA: Diagnosis not present

## 2022-11-19 DIAGNOSIS — R29898 Other symptoms and signs involving the musculoskeletal system: Secondary | ICD-10-CM | POA: Diagnosis not present

## 2022-11-19 DIAGNOSIS — R251 Tremor, unspecified: Secondary | ICD-10-CM | POA: Diagnosis not present

## 2022-11-24 ENCOUNTER — Other Ambulatory Visit: Payer: Self-pay | Admitting: Internal Medicine

## 2022-12-02 ENCOUNTER — Encounter: Payer: Self-pay | Admitting: Pharmacist

## 2022-12-03 ENCOUNTER — Ambulatory Visit
Admission: RE | Admit: 2022-12-03 | Discharge: 2022-12-03 | Disposition: A | Discharge status: Home / Self Care | Payer: Medicare Other | Source: Ambulatory Visit | Attending: Internal Medicine | Admitting: Internal Medicine

## 2022-12-03 DIAGNOSIS — Z1231 Encounter for screening mammogram for malignant neoplasm of breast: Secondary | ICD-10-CM | POA: Diagnosis not present

## 2022-12-07 NOTE — Progress Notes (Unsigned)
    Kathy Zeiter T. Toben Acuna, MD, [place] at [facility] [place], [address]  Phone: [phone]  FAX: [name] - 77 y.o. female  MRN [id number]  Date of Birth: Jan 13, 1946  Date: 12/08/2022  PCP: Venia Carbon, MD  Referral: Venia Carbon, MD  No chief complaint on file.  Subjective:   Kathy Acosta is a 77 y.o. very pleasant female patient with There is no height or weight on file to calculate BMI. who presents with the following:  Patient presents with thumb pain.  I saw her in April 2023 with a trigger thumb on the left.  I also seen her in 2021 for some de Quervain's tenosynovitis on the left, as well.    Review of Systems is noted in the HPI, as appropriate  Objective:   There were no vitals taken for this visit.  GEN: No acute distress; alert,appropriate. PULM: Breathing comfortably in no respiratory distress PSYCH: Normally interactive.   Laboratory and Imaging Data:  Assessment and Plan:   ***

## 2022-12-08 ENCOUNTER — Ambulatory Visit (INDEPENDENT_AMBULATORY_CARE_PROVIDER_SITE_OTHER): Payer: Medicare Other | Admitting: Family Medicine

## 2022-12-08 ENCOUNTER — Encounter: Payer: Self-pay | Admitting: Family Medicine

## 2022-12-08 VITALS — BP 130/76 | HR 78 | Temp 97.8°F | Ht 62.0 in | Wt 162.1 lb

## 2022-12-08 DIAGNOSIS — M654 Radial styloid tenosynovitis [de Quervain]: Secondary | ICD-10-CM

## 2022-12-08 DIAGNOSIS — M65312 Trigger thumb, left thumb: Secondary | ICD-10-CM

## 2022-12-08 MED ORDER — TRIAMCINOLONE ACETONIDE 40 MG/ML IJ SUSP
20.0000 mg | Freq: Once | INTRAMUSCULAR | Status: AC
  Administered 2022-12-08: 20 mg via INTRA_ARTICULAR

## 2022-12-09 ENCOUNTER — Encounter: Payer: Self-pay | Admitting: Internal Medicine

## 2022-12-09 ENCOUNTER — Other Ambulatory Visit: Payer: Self-pay | Admitting: Internal Medicine

## 2022-12-09 ENCOUNTER — Ambulatory Visit (INDEPENDENT_AMBULATORY_CARE_PROVIDER_SITE_OTHER): Payer: Medicare Other | Admitting: Internal Medicine

## 2022-12-09 VITALS — BP 138/84 | HR 78 | Temp 97.7°F | Ht 61.5 in | Wt 161.0 lb

## 2022-12-09 DIAGNOSIS — I10 Essential (primary) hypertension: Secondary | ICD-10-CM

## 2022-12-09 DIAGNOSIS — E269 Hyperaldosteronism, unspecified: Secondary | ICD-10-CM | POA: Diagnosis not present

## 2022-12-09 DIAGNOSIS — I700 Atherosclerosis of aorta: Secondary | ICD-10-CM | POA: Diagnosis not present

## 2022-12-09 DIAGNOSIS — I442 Atrioventricular block, complete: Secondary | ICD-10-CM

## 2022-12-09 DIAGNOSIS — K219 Gastro-esophageal reflux disease without esophagitis: Secondary | ICD-10-CM | POA: Diagnosis not present

## 2022-12-09 DIAGNOSIS — Z0000 Encounter for general adult medical examination without abnormal findings: Secondary | ICD-10-CM

## 2022-12-09 NOTE — Assessment & Plan Note (Signed)
BP Readings from Last 3 Encounters:  12/09/22 138/84  12/08/22 130/76  11/11/22 (!) 154/88   Better control now on amlodipine 10, valsartan/HCTZ 320/25 and spironolactone 25

## 2022-12-09 NOTE — Progress Notes (Signed)
Hearing Screening - Comments:: Passed whisper test Vision Screening - Comments:: May 2023

## 2022-12-09 NOTE — Assessment & Plan Note (Signed)
Doing well on omeprazole 20

## 2022-12-09 NOTE — Assessment & Plan Note (Signed)
On imaging On rosuvastatin '5mg'$  daily

## 2022-12-09 NOTE — Assessment & Plan Note (Signed)
Doing well since pacemaker inserted

## 2022-12-09 NOTE — Assessment & Plan Note (Signed)
BP control better since on spironolactone

## 2022-12-09 NOTE — Assessment & Plan Note (Signed)
I have personally reviewed the Medicare Annual Wellness questionnaire and have noted 1. The patient's medical and social history 2. Their use of alcohol, tobacco or illicit drugs 3. Their current medications and supplements 4. The patient's functional ability including ADL's, fall risks, home safety risks and hearing or visual             impairment. 5. Diet and physical activities 6. Evidence for depression or mood disorders  The patients weight, height, BMI and visual acuity have been recorded in the chart I have made referrals, counseling and provided education to the patient based review of the above and I have provided the pt with a written personalized care plan for preventive services.  I have provided you with a copy of your personalized plan for preventive services. Please take the time to review along with your updated medication list.  Done with colonoscopies Just had mammogram--consider one last one at 78 No pap due to hyster Discussed adding resistance exercise Flu vaccine (and consider RSV) in the fall

## 2022-12-09 NOTE — Progress Notes (Signed)
Subjective:    Patient ID: Kathy Acosta, female    DOB: Dec 12, 1945, 77 y.o.   MRN: [id number]  HPI Here for Medicare wellness visit and follow up of chronic health conditions Reviewed advanced directives Reviewed other doctors---Dr End--cardiology, Dr Sophronia Simas cardiology, Dr Vita Barley, Dr Potter--neurology, Dr Quincy Simmonds No hospitalizations in past year. Did have pacemaker placed in February Vision is fine Hearing is good Occasional glass of wine No tobacco Does walk some No falls No depression or anhedonia Independent with instrumental ADLs No sig memory issues  Breathing is better "Doing great" since pacemaker done 2/23 Very happy with the experience No chest pain  No dizziness or syncope No edema  Now diagnosed with hyperaldo state with persistent elevated BP Chronically high despite care with eating, etc Tries to get at least 5000 steps daily  Had severe left parietal headache Seen in ER last month after urgent care Never recurred Did see neurologist---gave med for prn use (hasn't needed)  Continues on the metformin Caused constipation so started miralax and stopped it Back on it now  Uses the omeprazole most days No dysphagia  Still with mild tremor No trouble walking Handwriting shaky--but doesn't get smaller  Current Outpatient Medications on File Prior to Visit  Medication Sig Dispense Refill   acetaminophen (TYLENOL) 650 MG CR tablet Take 1,300 mg by mouth every 8 (eight) hours as needed for pain.     amLODipine (NORVASC) 10 MG tablet TAKE 1 TABLET BY MOUTH  DAILY 90 tablet 3   ascorbic acid (VITAMIN C) 500 MG tablet Take 500 mg by mouth daily.     Chromium Picolinate (CHROMIUM PICOLATE PO) Take 1 capsule by mouth daily.     metFORMIN (GLUCOPHAGE-XR) 500 MG 24 hr tablet TAKE 1 TABLET BY MOUTH DAILY  WITH BREAKFAST 100 tablet 3   Multiple Minerals-Vitamins (MULTISOURCE CALCIUM MAG/D) TABS Take 1 tablet by mouth daily.     Multiple Vitamin  (MULTIVITAMIN WITH MINERALS) TABS tablet Take 1 tablet by mouth daily.     omeprazole (PRILOSEC) 20 MG capsule TAKE 1 CAPSULE BY MOUTH DAILY 90 capsule 3   rosuvastatin (CRESTOR) 5 MG tablet Take 1 tablet (5 mg total) by mouth daily. 90 tablet 3   spironolactone (ALDACTONE) 25 MG tablet Take 1 tablet (25 mg total) by mouth daily. 90 tablet 3   valsartan-hydrochlorothiazide (DIOVAN-HCT) 320-25 MG tablet Take 1 tablet by mouth daily. 90 tablet 3   No current facility-administered medications on file prior to visit.    Allergies  Allergen Reactions   Doxazosin     dizziness    Past Medical History:  Diagnosis Date   Arthritis    Cataract    bilateral repair   Essential tremor    last 2 years   Hx of colonic polyps    Hypertension    Impaired fasting glucose     Past Surgical History:  Procedure Laterality Date   ABDOMINAL HYSTERECTOMY     CATARACT EXTRACTION W/PHACO Right 07/12/2020   Procedure: CATARACT EXTRACTION PHACO AND INTRAOCULAR LENS PLACEMENT ([place]) RIGHT;  Surgeon: Birder Robson, MD;  Location: [facility];  Service: Ophthalmology;  Laterality: Right;  7.35 0:46.4   CATARACT EXTRACTION W/PHACO Left 07/31/2020   Procedure: CATARACT EXTRACTION PHACO AND INTRAOCULAR LENS PLACEMENT ([place]) LEFT;  Surgeon: Birder Robson, MD;  Location: [facility];  Service: Ophthalmology;  Laterality: Left;  6.27 0:36.5   CERVICAL DISCECTOMY  1992   2 times   CESAREAN SECTION  2 times   COLONOSCOPY  2016   PACEMAKER IMPLANT N/A 12/12/2021   Procedure: PACEMAKER IMPLANT;  Surgeon: Vickie Epley, MD;  Location: [facility];  Service: Cardiovascular;  Laterality: N/A;    Family History  Problem Relation Age of Onset   Hypertension Mother    Hypertension Father    Diabetes Father    Diabetes Brother    Cancer Paternal Aunt        colon   Colon cancer Paternal Aunt    Diabetes Brother    Heart disease Sister    Colon cancer Sister    Colon polyps  Neg Hx    Esophageal cancer Neg Hx    Stomach cancer Neg Hx    Rectal cancer Neg Hx     Social History   Socioeconomic History   Marital status: Married    Spouse name: Not on file   Number of children: 2   Years of education: Not on file   Highest education level: Not on file  Occupational History   Occupation: retired- Games developer[employer]  Tobacco Use   Smoking status: Never    Passive exposure: Past   Smokeless tobacco: Never  Vaping Use   Vaping Use: Never used  Substance and Sexual Activity   Alcohol use: Yes    Comment: glass of wine - once a month   Drug use: No   Sexual activity: Never    Birth control/protection: Surgical  Other Topics Concern   Not on file  Social History Narrative   No living will   Requests sons to be health care POA   Would like attempts at resuscitation.   Probably wouldn't want tube feeds if cognitively unaware   Social Determinants of Health   Financial Resource Strain: Not on file  Food Insecurity: Not on file  Transportation Needs: Not on file  Physical Activity: Not on file  Stress: Not on file  Social Connections: Not on file  Intimate Partner Violence: Not on file   Review of Systems Appetite is good Weight stable Sleeps fairly good Wears seat belt Teeth are fine--keeps up with dentist No suspicious skin lesions---considering starting with a derm Bowels move fine--no blood Voids okay---no sig incontinence No sig back or joint pains    Objective:   Physical Exam Constitutional:      Appearance: Normal appearance.  HENT:     Mouth/Throat:     Pharynx: No oropharyngeal exudate or posterior oropharyngeal erythema.  Eyes:     Conjunctiva/sclera: Conjunctivae normal.     Pupils: Pupils are equal, round, and reactive to light.  Cardiovascular:     Rate and Rhythm: Normal rate and regular rhythm.     Pulses: Normal pulses.     Heart sounds: No murmur heard.    No gallop.  Pulmonary:     Effort:  Pulmonary effort is normal.     Breath sounds: Normal breath sounds. No wheezing or rales.  Abdominal:     Palpations: Abdomen is soft.     Tenderness: There is no abdominal tenderness.  Musculoskeletal:     Cervical back: Neck supple.     Right lower leg: No edema.     Left lower leg: No edema.  Lymphadenopathy:     Cervical: No cervical adenopathy.  Skin:    Findings: No rash.  Neurological:     General: No focal deficit present.     Mental Status: She is alert and oriented to person, place,  and time.     Comments: Word naming 4 quickly then froze Recall 3/3  Psychiatric:        Mood and Affect: Mood normal.        Behavior: Behavior normal.            Assessment & Plan:

## 2022-12-12 ENCOUNTER — Other Ambulatory Visit
Admission: RE | Admit: 2022-12-12 | Discharge: 2022-12-12 | Disposition: A | Discharge status: Home / Self Care | Payer: Medicare Other | Source: Ambulatory Visit | Attending: Internal Medicine | Admitting: Internal Medicine

## 2022-12-12 DIAGNOSIS — Z79899 Other long term (current) drug therapy: Secondary | ICD-10-CM

## 2022-12-12 LAB — BASIC METABOLIC PANEL
Anion gap: 9 (ref 5–15)
BUN: 26 mg/dL — ABNORMAL HIGH (ref 8–23)
CO2: 25 mmol/L (ref 22–32)
Calcium: 9.3 mg/dL (ref 8.9–10.3)
Chloride: 100 mmol/L (ref 98–111)
Creatinine, Ser: 0.9 mg/dL (ref 0.44–1.00)
GFR, Estimated: >60 mL/min (ref >60)
Glucose, Bld: 126 mg/dL — ABNORMAL HIGH (ref 70–99)
Potassium: 3.9 mmol/L (ref 3.5–5.1)
Sodium: 134 mmol/L — ABNORMAL LOW (ref 135–145)

## 2022-12-18 ENCOUNTER — Ambulatory Visit (INDEPENDENT_AMBULATORY_CARE_PROVIDER_SITE_OTHER): Payer: Medicare Other

## 2022-12-18 DIAGNOSIS — I442 Atrioventricular block, complete: Secondary | ICD-10-CM

## 2022-12-21 LAB — CUP PACEART REMOTE DEVICE CHECK
Battery Remaining Longevity: 143 mo
Battery Voltage: 3.05 V
Brady Statistic AP VP Percent: 0.5 %
Brady Statistic AP VS Percent: 0 %
Brady Statistic AS VP Percent: 99.49 %
Brady Statistic AS VS Percent: 0.02 %
Brady Statistic RA Percent Paced: 0.5 %
Brady Statistic RV Percent Paced: 99.98 %
Date Time Interrogation Session: 20240215105839
Implantable Lead Connection Status: 753985
Implantable Lead Connection Status: 753985
Implantable Lead Implant Date: 20230209
Implantable Lead Implant Date: 20230209
Implantable Lead Location: 753859
Implantable Lead Location: 753860
Implantable Lead Model: 3830
Implantable Lead Model: 5076
Implantable Pulse Generator Implant Date: 20230209
Lead Channel Impedance Value: 399 Ohm
Lead Channel Impedance Value: 437 Ohm
Lead Channel Impedance Value: 456 Ohm
Lead Channel Impedance Value: 570 Ohm
Lead Channel Pacing Threshold Amplitude: 0.25 V
Lead Channel Pacing Threshold Amplitude: 0.875 V
Lead Channel Pacing Threshold Pulse Width: 0.4 ms
Lead Channel Pacing Threshold Pulse Width: 0.4 ms
Lead Channel Sensing Intrinsic Amplitude: 12.125 mV
Lead Channel Sensing Intrinsic Amplitude: 12.125 mV
Lead Channel Sensing Intrinsic Amplitude: 2.5 mV
Lead Channel Sensing Intrinsic Amplitude: 2.5 mV
Lead Channel Setting Pacing Amplitude: 1.5 V
Lead Channel Setting Pacing Amplitude: 2 V
Lead Channel Setting Pacing Pulse Width: 0.4 ms
Lead Channel Setting Sensing Sensitivity: 1.2 mV
Zone Setting Status: 755011

## 2023-01-12 NOTE — Progress Notes (Unsigned)
Follow-up Outpatient Visit Date: 01/14/2023  Primary Care Provider: Venia Carbon, MD [place] [address]  Chief Complaint: Follow-up heart block and hypertension  HPI:  Kathy Acosta is a 77 y.o. 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  I last saw her in December, at which time reported mild fatigue but was otherwise feeling well.  We elected to add spironolactone in the setting of uncontrolled hypertension and elevated serum aldosterone:plasma renin activity ratio.  I also referred her to endocrinology for further workup.  Today, Kathy Acosta reports that she has been feeling well.  She notes that her blood pressures remain mildly elevated at home but are improving.  She is tolerating spironolactone well and was able to get an endocrinology appointment in August.  She denies chest pain, shortness of breath, palpitations, lightheadedness, and edema.  On further questioning, she notes some morning fatigue as well as overnight snoring.  She has never been evaluated for sleep apnea.  --------------------------------------------------------------------------------------------------  Cardiovascular History & Procedures: Cardiovascular Problems: Complete heart block status post pacemaker Coronary artery calcification and aortic atherosclerosis Hyperaldosteronism   Risk Factors: Hypertension and age > 9   Cath/PCI: None   CV Surgery: None   EP Procedures and Devices: Dual-chamber pacemaker ([employer], 12/12/2021)   Non-Invasive Evaluation(s): Renal artery Doppler (08/25/2022): No evidence of significant stenosis in either renal artery. Pharmacologic MPI (02/25/2022): Low risk study without ischemia or scar.  LVEF > 65%.  Coronary artery calcification and aortic atherosclerosis  noted. TTE (12/11/2021): Normal LV size and wall thickness.  LVEF 70-75%.  GLS -24.3%.  Normal RV size and function.  Normal biatrial size.  Mild mitral and tricuspid regurgitation.  Recent CV Pertinent Labs: Lab Results  Component Value Date   CHOL 137 05/30/2022   HDL 50 05/30/2022   LDLCALC 64 05/30/2022   LDLDIRECT 83.0 11/22/2018   TRIG 113 05/30/2022   CHOLHDL 2.7 05/30/2022   K 3.9 12/12/2022   BUN 26 (H) 12/12/2022   BUN 18 12/11/2021   CREATININE 0.90 12/12/2022    Past medical and surgical history were reviewed and updated in EPIC.  Current Meds  Medication Sig   acetaminophen (TYLENOL) 650 MG CR tablet Take 1,300 mg by mouth every 8 (eight) hours as needed for pain.   amLODipine (NORVASC) 10 MG tablet TAKE 1 TABLET BY MOUTH  DAILY   ascorbic acid (VITAMIN C) 500 MG tablet Take 500 mg by mouth daily.   Chromium Picolinate (CHROMIUM PICOLATE PO) Take 1 capsule by mouth daily.   metFORMIN (GLUCOPHAGE-XR) 500 MG 24 hr tablet TAKE 1 TABLET BY MOUTH DAILY  WITH BREAKFAST   Multiple Minerals-Vitamins (MULTISOURCE CALCIUM MAG/D) TABS Take 1 tablet by mouth daily.   Multiple Vitamin (MULTIVITAMIN WITH MINERALS) TABS tablet Take 1 tablet by mouth daily.   omeprazole (PRILOSEC) 20 MG capsule TAKE 1 CAPSULE BY MOUTH DAILY   rosuvastatin (CRESTOR) 5 MG tablet TAKE 1 TABLET BY MOUTH DAILY   spironolactone (ALDACTONE) 25 MG tablet Take 1 tablet (25 mg total) by mouth daily.   valsartan-hydrochlorothiazide (DIOVAN-HCT) 320-25 MG tablet Take 1 tablet by mouth daily.    Allergies: Doxazosin  Social History   Tobacco Use   Smoking status: Never    Passive exposure: Past   Smokeless tobacco: Never  Vaping Use   Vaping Use: Never used  Substance Use Topics  Alcohol use: Yes    Comment: glass of wine - once a month   Drug use: No    Family History  Problem Relation Age of Onset   Hypertension Mother    Hypertension Father    Diabetes Father    Diabetes Brother    Cancer  Paternal Aunt        colon   Colon cancer Paternal Aunt    Diabetes Brother    Heart disease Sister    Colon cancer Sister    Colon polyps Neg Hx    Esophageal cancer Neg Hx    Stomach cancer Neg Hx    Rectal cancer Neg Hx     Review of Systems: A 12-system review of systems was performed and was negative except as noted in the HPI.  --------------------------------------------------------------------------------------------------  Physical Exam: BP (!) 140/80 (BP Location: Left Arm)   Pulse 70   Ht '5\' 2"'$  (1.575 m)   Wt 160 lb 3.2 oz (72.7 kg)   SpO2 96%   BMI 29.30 kg/m   General:  NAD. Neck: No JVD or HJR. Lungs: Clear to auscultation bilaterally without wheezes or crackles. Heart: Regular rate and rhythm without murmurs, rubs, or gallops. Abdomen: Soft, nontender, nondistended. Extremities: No lower extremity edema.  EKG: Sinus rhythm with atrially sensed and ventricularly paced rhythm.  No significant change from prior tracing on 10/09/2022.  Lab Results  Component Value Date   WBC 5.4 11/11/2022   HGB 13.3 11/11/2022   HCT 41.4 11/11/2022   MCV 85.5 11/11/2022   PLT 238 11/11/2022    Lab Results  Component Value Date   NA 134 (L) 12/12/2022   K 3.9 12/12/2022   CL 100 12/12/2022   CO2 25 12/12/2022   BUN 26 (H) 12/12/2022   CREATININE 0.90 12/12/2022   GLUCOSE 126 (H) 12/12/2022   ALT 38 05/30/2022    Lab Results  Component Value Date   CHOL 137 05/30/2022   HDL 50 05/30/2022   LDLCALC 64 05/30/2022   LDLDIRECT 83.0 11/22/2018   TRIG 113 05/30/2022   CHOLHDL 2.7 05/30/2022    --------------------------------------------------------------------------------------------------  ASSESSMENT AND PLAN: Complete heart block: Patient maintaining sinus rhythm with ventricular pacing.  Continue follow-up with the device clinic and Dr. Quentin Ore.  Hypertension and hyperaldosteronism: Blood pressure gradually improving and only mildly elevated today.  We  have agreed to increase spironolactone to 50 mg daily and continue current doses of amlodipine and valsartan-HCTZ.  Plan to repeat BMP in 1 to 2 weeks.  I encouraged Kathy Acosta to proceed with endocrinology appointment scheduled for this summer.  Hyperlipidemia and aortic atherosclerosis: LDL well-controlled on last check in 05/2022.  Continue current regimen of rosuvastatin and metformin.  Ongoing management per Dr. Silvio Pate.  Follow-up: Return to clinic in 3 month.  Nelva Bush, MD 01/14/2023 10:02 AM

## 2023-01-14 ENCOUNTER — Encounter: Payer: Self-pay | Admitting: Internal Medicine

## 2023-01-14 ENCOUNTER — Ambulatory Visit: Payer: Medicare Other | Attending: Internal Medicine | Admitting: Internal Medicine

## 2023-01-14 VITALS — BP 140/80 | HR 70 | Ht 62.0 in | Wt 160.2 lb

## 2023-01-14 DIAGNOSIS — I700 Atherosclerosis of aorta: Secondary | ICD-10-CM

## 2023-01-14 DIAGNOSIS — E785 Hyperlipidemia, unspecified: Secondary | ICD-10-CM | POA: Diagnosis not present

## 2023-01-14 DIAGNOSIS — E269 Hyperaldosteronism, unspecified: Secondary | ICD-10-CM | POA: Diagnosis not present

## 2023-01-14 DIAGNOSIS — I442 Atrioventricular block, complete: Secondary | ICD-10-CM | POA: Diagnosis not present

## 2023-01-14 DIAGNOSIS — I152 Hypertension secondary to endocrine disorders: Secondary | ICD-10-CM | POA: Diagnosis not present

## 2023-01-14 MED ORDER — SPIRONOLACTONE 50 MG PO TABS: 50.0000 mg | ORAL_TABLET | Freq: Every day | ORAL | 3 refills | Status: DC

## 2023-01-14 NOTE — Progress Notes (Signed)
Remote pacemaker transmission.   

## 2023-01-14 NOTE — Patient Instructions (Signed)
Medication Instructions:  Your physician recommends the following medication changes.  INCREASE: Spironolactone to 50 mg by mouth daily   *If you need a refill on your cardiac medications before your next appointment, please call your pharmacy*   Lab Work: Your provider would like for you to return in 2 weeks to have the following labs drawn: (BMP).   Please go to the [facility] entrance and check in at the front desk.  You do not need an appointment.  They are open from 7am-6 pm.  You will not need to be fasting.   If you have labs (blood work) drawn today and your tests are completely normal, you will receive your results only by: [place] (if you have MyChart) OR A paper copy in the mail If you have any lab test that is abnormal or we need to change your treatment, we will call you to review the results.   Testing/Procedures: None ordered today   Follow-Up: At [facility], you and your health needs are our priority.  As part of our continuing mission to provide you with exceptional heart care, we have created designated Provider Care Teams.  These Care Teams include your primary Cardiologist (physician) and Advanced Practice Providers (APPs -  Physician Assistants and Nurse Practitioners) who all work together to provide you with the care you need, when you need it.  We recommend signing up for the patient portal called "MyChart".  Sign up information is provided on this After Visit Summary.  MyChart is used to connect with patients for Virtual Visits (Telemedicine).  Patients are able to view lab/test results, encounter notes, upcoming appointments, etc.  Non-urgent messages can be sent to your provider as well.   To learn more about what you can do with MyChart, go to [email or web address].    Your next appointment:   3 month(s)  Provider:   You may see Nelva Bush, MD or one of the following Advanced Practice Providers on your designated Care  Team:   Murray Hodgkins, NP Christell Faith, PA-C Cadence Kathlen Mody, PA-C Gerrie Nordmann, NP

## 2023-01-15 ENCOUNTER — Encounter: Payer: Self-pay | Admitting: Internal Medicine

## 2023-01-29 ENCOUNTER — Other Ambulatory Visit: Payer: Self-pay | Admitting: *Deleted

## 2023-01-29 ENCOUNTER — Other Ambulatory Visit
Admission: RE | Admit: 2023-01-29 | Discharge: 2023-01-29 | Disposition: A | Discharge status: Home / Self Care | Payer: Medicare Other | Attending: Internal Medicine | Admitting: Internal Medicine

## 2023-01-29 DIAGNOSIS — Z79899 Other long term (current) drug therapy: Secondary | ICD-10-CM | POA: Diagnosis not present

## 2023-01-29 DIAGNOSIS — E785 Hyperlipidemia, unspecified: Secondary | ICD-10-CM

## 2023-01-29 DIAGNOSIS — I700 Atherosclerosis of aorta: Secondary | ICD-10-CM

## 2023-01-29 DIAGNOSIS — I152 Hypertension secondary to endocrine disorders: Secondary | ICD-10-CM

## 2023-01-29 DIAGNOSIS — I442 Atrioventricular block, complete: Secondary | ICD-10-CM

## 2023-01-29 DIAGNOSIS — E269 Hyperaldosteronism, unspecified: Secondary | ICD-10-CM | POA: Insufficient documentation

## 2023-01-29 LAB — BASIC METABOLIC PANEL
Anion gap: 11 (ref 5–15)
BUN: 30 mg/dL — ABNORMAL HIGH (ref 8–23)
CO2: 26 mmol/L (ref 22–32)
Calcium: 9.6 mg/dL (ref 8.9–10.3)
Chloride: 101 mmol/L (ref 98–111)
Creatinine, Ser: 1.03 mg/dL — ABNORMAL HIGH (ref 0.44–1.00)
GFR, Estimated: 56 mL/min — ABNORMAL LOW (ref >60)
Glucose, Bld: 119 mg/dL — ABNORMAL HIGH (ref 70–99)
Potassium: 4 mmol/L (ref 3.5–5.1)
Sodium: 138 mmol/L (ref 135–145)

## 2023-02-02 ENCOUNTER — Other Ambulatory Visit: Payer: Self-pay | Admitting: *Deleted

## 2023-02-02 DIAGNOSIS — I10 Essential (primary) hypertension: Secondary | ICD-10-CM

## 2023-03-19 ENCOUNTER — Telehealth: Payer: Self-pay

## 2023-03-19 ENCOUNTER — Ambulatory Visit (INDEPENDENT_AMBULATORY_CARE_PROVIDER_SITE_OTHER): Payer: Medicare Other

## 2023-03-19 DIAGNOSIS — I442 Atrioventricular block, complete: Secondary | ICD-10-CM | POA: Diagnosis not present

## 2023-03-19 LAB — CUP PACEART REMOTE DEVICE CHECK
Battery Remaining Longevity: 140 mo
Battery Voltage: 3.03 V
Brady Statistic AP VP Percent: 1.33 %
Brady Statistic AP VS Percent: 0 %
Brady Statistic AS VP Percent: 98.65 %
Brady Statistic AS VS Percent: 0.02 %
Brady Statistic RA Percent Paced: 1.32 %
Brady Statistic RV Percent Paced: 99.98 %
Date Time Interrogation Session: 20240516001143
Implantable Lead Connection Status: 753985
Implantable Lead Connection Status: 753985
Implantable Lead Implant Date: 20230209
Implantable Lead Implant Date: 20230209
Implantable Lead Location: 753859
Implantable Lead Location: 753860
Implantable Lead Model: 3830
Implantable Lead Model: 5076
Implantable Pulse Generator Implant Date: 20230209
Lead Channel Impedance Value: 418 Ohm
Lead Channel Impedance Value: 456 Ohm
Lead Channel Impedance Value: 475 Ohm
Lead Channel Impedance Value: 551 Ohm
Lead Channel Pacing Threshold Amplitude: 0.25 V
Lead Channel Pacing Threshold Amplitude: 0.75 V
Lead Channel Pacing Threshold Pulse Width: 0.4 ms
Lead Channel Pacing Threshold Pulse Width: 0.4 ms
Lead Channel Sensing Intrinsic Amplitude: 12.125 mV
Lead Channel Sensing Intrinsic Amplitude: 12.125 mV
Lead Channel Sensing Intrinsic Amplitude: 2 mV
Lead Channel Sensing Intrinsic Amplitude: 2 mV
Lead Channel Setting Pacing Amplitude: 1.5 V
Lead Channel Setting Pacing Amplitude: 2 V
Lead Channel Setting Pacing Pulse Width: 0.4 ms
Lead Channel Setting Sensing Sensitivity: 1.2 mV
Zone Setting Status: 755011

## 2023-03-19 NOTE — Telephone Encounter (Signed)
Following alert received from [employer] received for 1 VHR event of 25 beats duration, c/w NSVT at 197 bpm. Routed to triage.  Next remote 91 days.  Attempted to contact patient to assess. No answer, LMTCB.

## 2023-03-20 NOTE — Telephone Encounter (Signed)
2nd attempt to contact patient. No answer, LMTCB. 

## 2023-03-23 NOTE — Telephone Encounter (Signed)
Patient is returning phone call.  °

## 2023-03-23 NOTE — Telephone Encounter (Signed)
Returned patients call. Patient was asymptomatic. Reports compliance with meds. Will route to Dr. Lalla Brothers since scheduled so he is aware.

## 2023-03-23 NOTE — Telephone Encounter (Signed)
2nd attempt.  No answer, LMTCB.

## 2023-03-25 DIAGNOSIS — M3501 Sicca syndrome with keratoconjunctivitis: Secondary | ICD-10-CM | POA: Diagnosis not present

## 2023-03-25 DIAGNOSIS — Z961 Presence of intraocular lens: Secondary | ICD-10-CM | POA: Diagnosis not present

## 2023-04-02 NOTE — Progress Notes (Signed)
Remote pacemaker transmission.   

## 2023-04-15 ENCOUNTER — Other Ambulatory Visit: Payer: Self-pay | Admitting: Internal Medicine

## 2023-04-17 ENCOUNTER — Other Ambulatory Visit
Admission: RE | Admit: 2023-04-17 | Discharge: 2023-04-17 | Disposition: A | Discharge status: Home / Self Care | Payer: Medicare Other | Attending: Internal Medicine | Admitting: Internal Medicine

## 2023-04-17 DIAGNOSIS — I10 Essential (primary) hypertension: Secondary | ICD-10-CM

## 2023-04-17 LAB — BASIC METABOLIC PANEL
Anion gap: 8 (ref 5–15)
BUN: 30 mg/dL — ABNORMAL HIGH (ref 8–23)
CO2: 25 mmol/L (ref 22–32)
Calcium: 9.1 mg/dL (ref 8.9–10.3)
Chloride: 101 mmol/L (ref 98–111)
Creatinine, Ser: 1.09 mg/dL — ABNORMAL HIGH (ref 0.44–1.00)
GFR, Estimated: 52 mL/min — ABNORMAL LOW (ref >60)
Glucose, Bld: 101 mg/dL — ABNORMAL HIGH (ref 70–99)
Potassium: 4.9 mmol/L (ref 3.5–5.1)
Sodium: 134 mmol/L — ABNORMAL LOW (ref 135–145)

## 2023-04-29 ENCOUNTER — Other Ambulatory Visit: Payer: Self-pay | Admitting: Internal Medicine

## 2023-05-01 DIAGNOSIS — R051 Acute cough: Secondary | ICD-10-CM | POA: Diagnosis not present

## 2023-05-06 ENCOUNTER — Encounter: Payer: Medicare Other | Admitting: Cardiology

## 2023-05-13 ENCOUNTER — Encounter: Payer: Self-pay | Admitting: Internal Medicine

## 2023-05-13 ENCOUNTER — Encounter: Payer: Medicare Other | Admitting: Cardiology

## 2023-05-13 ENCOUNTER — Ambulatory Visit: Payer: Medicare Other | Attending: Internal Medicine | Admitting: Internal Medicine

## 2023-05-13 VITALS — BP 108/80 | HR 67 | Ht 62.0 in | Wt 159.5 lb

## 2023-05-13 DIAGNOSIS — E785 Hyperlipidemia, unspecified: Secondary | ICD-10-CM | POA: Diagnosis not present

## 2023-05-13 DIAGNOSIS — I700 Atherosclerosis of aorta: Secondary | ICD-10-CM | POA: Diagnosis not present

## 2023-05-13 DIAGNOSIS — I10 Essential (primary) hypertension: Secondary | ICD-10-CM | POA: Diagnosis not present

## 2023-05-13 DIAGNOSIS — I442 Atrioventricular block, complete: Secondary | ICD-10-CM

## 2023-05-13 MED ORDER — ROSUVASTATIN CALCIUM 5 MG PO TABS: 5.0000 mg | ORAL_TABLET | Freq: Every day | ORAL | 3 refills | Status: DC

## 2023-05-13 MED ORDER — SPIRONOLACTONE 25 MG PO TABS: 25.0000 mg | ORAL_TABLET | Freq: Every day | ORAL | 3 refills | Status: DC

## 2023-05-13 MED ORDER — VALSARTAN-HYDROCHLOROTHIAZIDE 320-25 MG PO TABS: 1.0000 | ORAL_TABLET | Freq: Every day | ORAL | 3 refills | Status: DC

## 2023-05-13 NOTE — Progress Notes (Unsigned)
Cardiology Office Note Date:  05/14/2023  Patient ID:  Kathy Acosta, Kathy Acosta 15-Jul-1946, MRN [id number] PCP:  Karie Schwalbe, MD  Cardiologist:  Yvonne Kendall, MD Electrophysiologist: Lanier Prude, MD   Chief Complaint: 1 year device follow-up  History of Present Illness: Kathy Acosta is a 77 y.o. female with PMH notable for complete heart block status post PPM, hypertension,; seen today for Lanier Prude, MD for routine electrophysiology followup.  She last saw Dr. Lalla Brothers 03/2022 for routine 91d post implant appointment, was doing well with improved energy. She saw Dr. Okey Dupre yesterday, BP well-controlled in office and by home readings as of late.  On follow-up today, patient overall feels well, denies palpitations.  She does complain of reduced energy level and fatigue.  She states that after she does a task (like cooking breakfast or washing dishes) she has to take a break and rest because of her fatigue.  She states that she feels overall better than she did prior to her pacemaker implant, but this level of fatigue has continued.  She is slightly anxious about turning 78 years old as she views this age is old and she does not feel old.    she denies chest pain, palpitations, dyspnea, PND, orthopnea, nausea, vomiting, dizziness, syncope, edema, weight gain, or early satiety.    Device Information: MDT dual chamber PPM, imp 12/2021; dx CHB   Past Medical History:  Diagnosis Date   Arthritis    Cataract    bilateral repair   Essential tremor    last 2 years   Hx of colonic polyps    Hypertension    Impaired fasting glucose     Past Surgical History:  Procedure Laterality Date   ABDOMINAL HYSTERECTOMY     CATARACT EXTRACTION W/PHACO Right 07/12/2020   Procedure: CATARACT EXTRACTION PHACO AND INTRAOCULAR LENS PLACEMENT (IOC) RIGHT;  Surgeon: Galen Manila, MD;  Location: [facility];  Service: Ophthalmology;  Laterality: Right;  7.35 0:46.4   CATARACT  EXTRACTION W/PHACO Left 07/31/2020   Procedure: CATARACT EXTRACTION PHACO AND INTRAOCULAR LENS PLACEMENT (IOC) LEFT;  Surgeon: Galen Manila, MD;  Location: [facility];  Service: Ophthalmology;  Laterality: Left;  6.27 0:36.5   CERVICAL DISCECTOMY  1992   2 times   CESAREAN SECTION     2 times   COLONOSCOPY  2016   PACEMAKER IMPLANT N/A 12/12/2021   Procedure: PACEMAKER IMPLANT;  Surgeon: Lanier Prude, MD;  [facility]: [facility];  Service: Cardiovascular;  Laterality: N/A;    Current Outpatient Medications  Medication Instructions   acetaminophen (TYLENOL) 1,300 mg, Oral, Every 8 hours PRN   amLODipine (NORVASC) 10 MG tablet TAKE 1 TABLET BY MOUTH DAILY   ascorbic acid (VITAMIN C) 500 mg, Oral, Daily,     Chromium Picolinate (CHROMIUM PICOLATE PO) 1 capsule, Oral, Daily   metFORMIN (GLUCOPHAGE-XR) 500 mg, Oral, Daily with breakfast   Multiple Minerals-Vitamins (MULTISOURCE CALCIUM MAG/D) TABS 1 tablet, Oral, Daily   Multiple Vitamin (MULTIVITAMIN WITH MINERALS) TABS tablet 1 tablet, Oral, Daily   omeprazole (PRILOSEC) 20 mg, Oral, Daily   rosuvastatin (CRESTOR) 5 mg, Oral, Daily   spironolactone (ALDACTONE) 25 mg, Oral, Daily   valsartan-hydrochlorothiazide (DIOVAN-HCT) 320-25 MG tablet 1 tablet, Oral, Daily    Social History:  The patient  reports that she has never smoked. She has been exposed to tobacco smoke. She has never used smokeless tobacco. She reports current alcohol use. She reports that she does not use  drugs.   Family History:  The patient's family history includes Cancer in her paternal aunt; Colon cancer in her paternal aunt and sister; Diabetes in her brother, brother, and father; Heart disease in her sister; Hypertension in her father and mother.  ROS:  Please see the history of present illness. All other systems are reviewed and otherwise negative.   PHYSICAL EXAM:  VS:  BP 130/74 (BP Location: Left Arm, Patient Position: Sitting, Cuff Size:  Normal)   Pulse 81   Ht 5\' 2"  (1.575 m)   Wt 162 lb (73.5 kg)   SpO2 98%   BMI 29.63 kg/m  BMI: Body mass index is 29.63 kg/m.  GEN- The patient is well appearing, alert and oriented x 3 today.   Lungs- Clear to ausculation bilaterally, normal work of breathing.  Heart- Regular rate and rhythm, no murmurs, rubs or gallops Extremities- No peripheral edema, warm, dry Skin-   device pocket well-healed, no tethering   Device interrogation done today and reviewed by myself:  Battery 11+ years Lead thresholds, impedence, sensing stable  Brief NSVT episode Histograms appropriate for activity No changes made today  EKG is not ordered. Personal review of EKG from  05/13/2023  shows: A sensed V paced rhythm, 67 bpm        Recent Labs: 05/30/2022: ALT 38 11/11/2022: Hemoglobin 13.3; Platelets 238 04/17/2023: BUN 30; Creatinine, Ser 1.09; Potassium 4.9; Sodium 134  05/30/2022: Cholesterol 137; HDL 50; LDL Cholesterol 64; Total CHOL/HDL Ratio 2.7; Triglycerides 113; VLDL 23   CrCl cannot be calculated (Patient's most recent lab result is older than the maximum 21 days allowed.).   Wt Readings from Last 3 Encounters:  05/14/23 162 lb (73.5 kg)  05/13/23 159 lb 8 oz (72.3 kg)  01/14/23 160 lb 3.2 oz (72.7 kg)     Additional studies reviewed include: Previous EP, cardiology notes.    1. Left ventricular ejection fraction, by estimation, is 70 to 75%. Left ventricular ejection fraction by 2D MOD biplane is 77.4 %. The left ventricle has hyperdynamic function. The left ventricle has no regional wall motion abnormalities. Left ventricular diastolic parameters are indeterminate. The average left ventricular global longitudinal strain is -24.3 %. The global longitudinal strain is normal.   2. Right ventricular systolic function is normal. The right ventricular size is normal.   3. The mitral valve is normal in structure. Mild mitral valve regurgitation.   4. The aortic valve is tricuspid. Aortic  valve regurgitation is mild.   5. The inferior vena cava is dilated in size with >50% respiratory variability, suggesting right atrial pressure of 8 mmHg.   ASSESSMENT AND PLAN:  #) CHB s/p [employer] PPM Device functioning well, see Paceart for details Histograms appropriate for patient activity Patient has intact sinus node with chronotropic competency, no device reasons for patient's ongoing fatigue Patient with improved energy levels since pacemaker placement, though still notes fatigue after activities - update echo to eval given ongoing fatigue   #) HTN Normotensive in office today. She follows up regularly with Dr. Okey Dupre I recommended that she check her blood pressure and heart rate during her episodes of fatigue to potentially correlate cause of fatigue with elevated or reduced blood pressure      Current medicines are reviewed at length with the patient today.   The patient does not have concerns regarding her medicines.  The following changes were made today:  none  Labs/ tests ordered today include:  No orders of the defined types were  placed in this encounter.    Disposition: Follow up with Dr. Lalla Brothers or EP APP in 12 months   Signed, Sherie Don, NP  05/14/23  2:12 PM  Electrophysiology [facility]

## 2023-05-13 NOTE — Progress Notes (Signed)
  Cardiology Office Note:  .   Date:  05/13/2023  ID:  Kathy Acosta, DOB 1945/11/27, MRN [id number] PCP: Karie Schwalbe, MD  [facility] Providers Cardiologist:  Yvonne Kendall, MD Electrophysiologist:  Lanier Prude, MD     History of Present Illness: .   Kathy Acosta is a 77 y.o. 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  I last saw her in March, at which time she was feeling well.  Her blood pressure was still somewhat elevated at home, though it was getting better.  She was tolerating spironolactone well.  She mentioned endocrinology appointment scheduled for August.  We agreed to increase spironolactone to 50 mg daily.  Unfortunately, Kathy Acosta did not tolerate this dose of spironolactone due to exhaustion and shortness of breath and self-reduced the dose back to 25 mg daily.  Other than feeling a bit faint yesterday, she has felt relatively well since going back to the lower dose of spironolactone.  She notes a recent illness for which she was given antibiotics while visiting [place] and has not been eating and drinking quite as well as usual over the last 3 weeks.  Her home BP readings have trended down during this time.  Kathy Acosta denies chest pain, palpitations, and edema.  She is trying to walk when possible and is tolerating this well.  ROS: See HPI  Studies Reviewed: Marland Kitchen   EKG Interpretation Date/Time:  Wednesday May 13 2023 09:49:23 EDT Ventricular Rate:  67 PR Interval:  162 QRS Duration:  144 QT Interval:  432 QTC Calculation: 456 R Axis:   -62  Text Interpretation: Atrial-sensed ventricular-paced rhythm When compared with ECG of 14-Jan-2023 No significant change was found Confirmed by Logyn Kendrick, Cristal Deer [phone]) on 05/13/2023 7:52:50 PM    Risk Assessment/Calculations:              Physical Exam:   VS:  BP 108/80 (BP Location: Left Arm, Patient Position: Sitting, Cuff Size: Normal)   Pulse 67   Ht 5\' 2"  (1.575 m)   Wt 159 lb 8 oz (72.3 kg)   SpO2 98%   BMI 29.17 kg/m    Wt Readings from Last 3 Encounters:  05/13/23 159 lb 8 oz (72.3 kg)  01/14/23 160 lb 3.2 oz (72.7 kg)  12/09/22 161 lb (73 kg)    General:  NAD. Neck: No JVD or HJR. Lungs: Clear to auscultation bilaterally without wheezes or crackles. Heart: Regular rate and rhythm without murmurs, rubs, or gallops. Abdomen: Soft, nontender, nondistended. Extremities: No lower extremity edema.  ASSESSMENT AND PLAN: .    Complete heart block: EKG again shows AV-paced rhythm.  Asymptomatic episode of NSVT noted on device interrogation in May.  Continued monitoring without intervention was recommended by Dr. Lalla Brothers.  Kathy Acosta is scheduled for follow-up in EP clinic tomorrow with Sherie Don, NP.  Hypertension and hyperaldosteronism: BP well-controlled today despite intolerance of escalation of spironolactone.  We will plan to continue spironolactone 25 mg daily and proceed with endocrinology consultation scheduled for next month.  Continue current doses of amlodipine and valsartan-hydrochlorothiazide as well.  Hyperlipidemia and aortic atherosclerosis: Continue low-dose rosuvastatin.  LDL well-controlled on last check a year ago.  Lipids to be rechecked at next blood draw.     Dispo: Return to clinic in 6 months.  Signed, Yvonne Kendall, MD

## 2023-05-13 NOTE — Patient Instructions (Signed)
Medication Instructions:  Your physician recommends that you continue on your current medications as directed. Please refer to the Current Medication list given to you today.    *If you need a refill on your cardiac medications before your next appointment, please call your pharmacy*   Lab Work: None ordered today   Testing/Procedures: None ordered today   Follow-Up: At [facility], you and your health needs are our priority.  As part of our continuing mission to provide you with exceptional heart care, we have created designated Provider Care Teams.  These Care Teams include your primary Cardiologist (physician) and Advanced Practice Providers (APPs -  Physician Assistants and Nurse Practitioners) who all work together to provide you with the care you need, when you need it.  We recommend signing up for the patient portal called "MyChart".  Sign up information is provided on this After Visit Summary.  MyChart is used to connect with patients for Virtual Visits (Telemedicine).  Patients are able to view lab/test results, encounter notes, upcoming appointments, etc.  Non-urgent messages can be sent to your provider as well.   To learn more about what you can do with MyChart, go to [email or web address].    Your next appointment:   6 month(s)  Provider:   You may see Christopher End, MD or one of the following Advanced Practice Providers on your designated Care Team:   Christopher Berge, NP Ryan Dunn, PA-C Cadence Furth, PA-C Sheri Hammock, NP      

## 2023-05-14 ENCOUNTER — Encounter: Payer: Self-pay | Admitting: Cardiology

## 2023-05-14 ENCOUNTER — Ambulatory Visit: Payer: Medicare Other | Attending: Cardiology | Admitting: Cardiology

## 2023-05-14 VITALS — BP 130/74 | HR 81 | Ht 62.0 in | Wt 162.0 lb

## 2023-05-14 DIAGNOSIS — I10 Essential (primary) hypertension: Secondary | ICD-10-CM

## 2023-05-14 DIAGNOSIS — R5383 Other fatigue: Secondary | ICD-10-CM

## 2023-05-14 DIAGNOSIS — I442 Atrioventricular block, complete: Secondary | ICD-10-CM

## 2023-05-14 DIAGNOSIS — Z950 Presence of cardiac pacemaker: Secondary | ICD-10-CM

## 2023-05-14 LAB — CUP PACEART INCLINIC DEVICE CHECK
Date Time Interrogation Session: 20240711162644
Implantable Lead Connection Status: 753985
Implantable Lead Connection Status: 753985
Implantable Lead Implant Date: 20230209
Implantable Lead Implant Date: 20230209
Implantable Lead Location: 753859
Implantable Lead Location: 753860
Implantable Lead Model: 3830
Implantable Lead Model: 5076
Implantable Pulse Generator Implant Date: 20230209

## 2023-05-14 NOTE — Patient Instructions (Signed)
Medication Instructions:  Your physician recommends that you continue on your current medications as directed. Please refer to the Current Medication list given to you today.  *If you need a refill on your cardiac medications before your next appointment, please call your pharmacy*   Lab Work: No labs ordered  If you have labs (blood work) drawn today and your tests are completely normal, you will receive your results only by: MyChart Message (if you have MyChart) OR A paper copy in the mail If you have any lab test that is abnormal or we need to change your treatment, we will call you to review the results.   Testing/Procedures: Your physician has requested that you have an echocardiogram. Echocardiography is a painless test that uses sound waves to create images of your heart. It provides your doctor with information about the size and shape of your heart and how well your heart's chambers and valves are working. This procedure takes approximately one hour. There are no restrictions for this procedure. Please do NOT wear cologne, perfume, aftershave, or lotions (deodorant is allowed). Please arrive 15 minutes prior to your appointment time.  Follow-Up: At [facility], you and your health needs are our priority.  As part of our continuing mission to provide you with exceptional heart care, we have created designated Provider Care Teams.  These Care Teams include your primary Cardiologist (physician) and Advanced Practice Providers (APPs -  Physician Assistants and Nurse Practitioners) who all work together to provide you with the care you need, when you need it.  We recommend signing up for the patient portal called "MyChart".  Sign up information is provided on this After Visit Summary.  MyChart is used to connect with patients for Virtual Visits (Telemedicine).  Patients are able to view lab/test results, encounter notes, upcoming appointments, etc.  Non-urgent messages can be  sent to your provider as well.   To learn more about what you can do with MyChart, go to [email or web address].    Your next appointment:   1 year(s)  Provider:   Cameron Lambert, MD or Suzann Riddle, NP  

## 2023-05-14 NOTE — Addendum Note (Signed)
Addended by: Thayer Headings, Anapaula Severt L on: 05/14/2023 02:20 PM   Modules accepted: Orders

## 2023-06-09 ENCOUNTER — Other Ambulatory Visit: Payer: Self-pay | Admitting: Cardiology

## 2023-06-09 ENCOUNTER — Ambulatory Visit: Payer: Medicare Other | Attending: Cardiology

## 2023-06-09 DIAGNOSIS — I10 Essential (primary) hypertension: Secondary | ICD-10-CM

## 2023-06-09 DIAGNOSIS — I442 Atrioventricular block, complete: Secondary | ICD-10-CM

## 2023-06-09 DIAGNOSIS — R5383 Other fatigue: Secondary | ICD-10-CM | POA: Diagnosis not present

## 2023-06-09 DIAGNOSIS — Z950 Presence of cardiac pacemaker: Secondary | ICD-10-CM

## 2023-06-11 ENCOUNTER — Telehealth: Payer: Self-pay

## 2023-06-11 NOTE — Telephone Encounter (Signed)
Left a detailed message per DPR with ECHO results and comments from Sherie Don, NP.

## 2023-06-11 NOTE — Telephone Encounter (Signed)
-----   Message from [place] sent at 06/11/2023  8:09 AM EDT ----- Echo overall is reassuring, nothing to explain her fatigue Normal heart squeeze, slightly impaired relaxation Mitral valve has annular calcifications - she is already on statin Aortic valve normal

## 2023-06-18 ENCOUNTER — Ambulatory Visit (INDEPENDENT_AMBULATORY_CARE_PROVIDER_SITE_OTHER): Payer: Medicare Other

## 2023-06-18 DIAGNOSIS — I442 Atrioventricular block, complete: Secondary | ICD-10-CM

## 2023-06-18 LAB — CUP PACEART REMOTE DEVICE CHECK
Battery Remaining Longevity: 137 mo
Battery Voltage: 3.02 V
Brady Statistic AP VP Percent: 0.22 %
Brady Statistic AP VS Percent: 0 %
Brady Statistic AS VP Percent: 99.77 %
Brady Statistic AS VS Percent: 0.01 %
Brady Statistic RA Percent Paced: 0.22 %
Brady Statistic RV Percent Paced: 99.99 %
Date Time Interrogation Session: 20240814232020
Implantable Lead Connection Status: 753985
Implantable Lead Connection Status: 753985
Implantable Lead Implant Date: 20230209
Implantable Lead Implant Date: 20230209
Implantable Lead Location: 753859
Implantable Lead Location: 753860
Implantable Lead Model: 3830
Implantable Lead Model: 5076
Implantable Pulse Generator Implant Date: 20230209
Lead Channel Impedance Value: 380 Ohm
Lead Channel Impedance Value: 418 Ohm
Lead Channel Impedance Value: 494 Ohm
Lead Channel Impedance Value: 551 Ohm
Lead Channel Pacing Threshold Amplitude: 0.25 V
Lead Channel Pacing Threshold Amplitude: 0.875 V
Lead Channel Pacing Threshold Pulse Width: 0.4 ms
Lead Channel Pacing Threshold Pulse Width: 0.4 ms
Lead Channel Sensing Intrinsic Amplitude: 12.125 mV
Lead Channel Sensing Intrinsic Amplitude: 13 mV
Lead Channel Sensing Intrinsic Amplitude: 2 mV
Lead Channel Sensing Intrinsic Amplitude: 2 mV
Lead Channel Setting Pacing Amplitude: 1.5 V
Lead Channel Setting Pacing Amplitude: 2 V
Lead Channel Setting Pacing Pulse Width: 0.4 ms
Lead Channel Setting Sensing Sensitivity: 1.2 mV
Zone Setting Status: 755011

## 2023-07-01 NOTE — Progress Notes (Signed)
Remote pacemaker transmission.   

## 2023-07-03 ENCOUNTER — Ambulatory Visit: Payer: Medicare Other | Admitting: Internal Medicine

## 2023-07-03 ENCOUNTER — Encounter: Payer: Self-pay | Admitting: Internal Medicine

## 2023-07-03 VITALS — BP 120/70 | HR 73 | Ht 62.0 in | Wt 160.8 lb

## 2023-07-03 DIAGNOSIS — R7989 Other specified abnormal findings of blood chemistry: Secondary | ICD-10-CM | POA: Diagnosis not present

## 2023-07-03 MED ORDER — AMLODIPINE BESYLATE 5 MG PO TABS: 5.0000 mg | ORAL_TABLET | Freq: Every day | ORAL | 2 refills | Status: DC

## 2023-07-03 NOTE — Progress Notes (Signed)
Name: Kathy Acosta  MRN/ DOB: [id number], 05/19/1946    Age/ Sex: 77 y.o., female    PCP: Karie Schwalbe, MD   Reason for Endocrinology Evaluation: Elevated Aldo/PRA ratio     Date of Initial Endocrinology Evaluation: 07/03/2023     HPI: Kathy Acosta is a 77 y.o. 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 The patient presented for initial endocrinology clinic visit on 07/03/2023 for consultative assistance with her elevated Aldo/PRA ratio.   Patient was referred here for further evaluation of elevated Aldo/PRA ratio 08/2022 at 77 (reference 0.0-30.0)   She was diagnosed with HTN in her 30's Historically she has difficult to control BP but recently it has been well controlled  Denies recent headaches  Denies visual changes  Denies abdominal pain  Has occasional constipation  Denies LE edema  No recent palpitations since having the PPM   Kathy Acosta on Spironolactone 25 mg daily which was started 01/2023  She tried to increase Spironolactone but she felt exhausted and drained      Both parents had HTN and some of her siblings   Spironolactone 25 mg daily  Valsartan-hydrochlorothiazide 320-25 mg daily  Amlodipine ( Norvasc ) 10 mg daily     HISTORY:  Past Medical History:  Past Medical History:  Diagnosis Date   Arthritis    Cataract    bilateral repair   Essential tremor    last 2 years   Hx of colonic polyps    Hypertension    Impaired fasting glucose    Past Surgical History:  Past Surgical History:  Procedure Laterality Date   ABDOMINAL HYSTERECTOMY     CATARACT EXTRACTION W/PHACO Right 07/12/2020   Procedure: CATARACT EXTRACTION PHACO AND INTRAOCULAR LENS PLACEMENT (IOC) RIGHT;  Surgeon: Galen Manila, MD;  Location: [facility];  Service: Ophthalmology;  Laterality: Right;  7.35 0:46.4   CATARACT EXTRACTION W/PHACO Left 07/31/2020   Procedure: CATARACT EXTRACTION PHACO AND INTRAOCULAR LENS  PLACEMENT (IOC) LEFT;  Surgeon: Galen Manila, MD;  Location: [facility];  Service: Ophthalmology;  Laterality: Left;  6.27 0:36.5   CERVICAL DISCECTOMY  1992   2 times   CESAREAN SECTION     2 times   COLONOSCOPY  2016   PACEMAKER IMPLANT N/A 12/12/2021   Procedure: PACEMAKER IMPLANT;  Surgeon: Lanier Prude, MD;  Location: [facility];  Service: Cardiovascular;  Laterality: N/A;    Social History:  reports that she has never smoked. She has been exposed to tobacco smoke. She has never used smokeless tobacco. She reports current alcohol use. She reports that she does not use drugs. Family History: family history includes Cancer in her paternal aunt; Colon cancer in her paternal aunt and sister; Diabetes in her brother, brother, and father; Heart disease in her sister; Hypertension in her father and mother.   HOME MEDICATIONS: Allergies as of 07/03/2023       Reactions   Doxazosin    dizziness        Medication List        Accurate as of July 03, 2023  4:57 PM. If you have any questions, ask your nurse or doctor.          acetaminophen 650 MG CR tablet Commonly known as: TYLENOL Take 1,300 mg by mouth every 8 (eight) hours as needed for pain.   amLODipine 5 MG tablet Commonly known as: NORVASC Take 1 tablet (  5 mg total) by mouth daily. What changed:  medication strength how much to take Changed by: Johnney Ou Deep [place]   ascorbic acid 500 MG tablet Commonly known as: VITAMIN C Take 500 mg by mouth daily.   CHROMIUM PICOLATE PO Take 1 capsule by mouth daily.   metFORMIN 500 MG 24 hr tablet Commonly known as: GLUCOPHAGE-XR TAKE 1 TABLET BY MOUTH DAILY  WITH BREAKFAST   Multi[employer] Calcium Mag/D Tabs Take 1 tablet by mouth daily.   multivitamin with minerals Tabs tablet Take 1 tablet by mouth daily.   omeprazole 20 MG capsule Commonly known as: PRILOSEC TAKE 1 CAPSULE BY MOUTH DAILY   rosuvastatin 5 MG tablet Commonly known as:  CRESTOR Take 1 tablet (5 mg total) by mouth daily.   spironolactone 25 MG tablet Commonly known as: ALDACTONE Take 1 tablet (25 mg total) by mouth daily.   valsartan-hydrochlorothiazide 320-25 MG tablet Commonly known as: DIOVAN-HCT Take 1 tablet by mouth daily.          REVIEW OF SYSTEMS: A comprehensive ROS was conducted with the patient and is negative except as per HPI    OBJECTIVE:  VS: BP 120/70 (BP Location: Left Arm, Patient Position: Sitting, Cuff Size: Large)   Pulse 73   Ht 5\' 2"  (1.575 m)   Wt 160 lb 12.8 oz (72.9 kg)   SpO2 99%   BMI 29.41 kg/m    Wt Readings from Last 3 Encounters:  07/03/23 160 lb 12.8 oz (72.9 kg)  05/14/23 162 lb (73.5 kg)  05/13/23 159 lb 8 oz (72.3 kg)     EXAM: General: Kathy Acosta appears well and is in NAD  Eyes: External eye exam normal without stare, lid lag or exophthalmos.  EOM intact.  PERRL.  Neck: General: Supple without adenopathy. Thyroid: Thyroid size normal.  No goiter or nodules appreciated. No thyroid bruit.  Lungs: Clear with good BS bilat   Heart: Auscultation: RRR.  Abdomen: Soft, nontender  Extremities:  BL LE: No pretibial edema   Mental Status: Judgment, insight: Intact Orientation: Oriented to time, place, and person Mood and affect: No depression, anxiety, or agitation     DATA REVIEWED:     Latest Reference Range & Units 04/17/23 12:52  Sodium 135 - 145 mmol/L 134 (L)  Potassium 3.5 - 5.1 mmol/L 4.9  Chloride 98 - 111 mmol/L 101  CO2 22 - 32 mmol/L 25  Glucose 70 - 99 mg/dL 161 (H)  BUN 8 - 23 mg/dL 30 (H)  Creatinine 0.96 - 1.00 mg/dL 0.45 (H)  Calcium 8.9 - 10.3 mg/dL 9.1  Anion gap 5 - 15  8  GFR, Estimated >60 mL/min 52 (L)  (L): Data is abnormally low (H): Data is abnormally high  ASSESSMENT/PLAN/RECOMMENDATIONS:   Abnormal aldosterone/PRA ratio:  -High suspicion for hyperaldosteronism -I discussed with the patient that typically this requires confirmation with either saline or oral salt  loading, if this is confirmed, the neck step will require localization to adrenal venous sampling. -Patient is not keen on any invasive procedures at this time, which I agree with given advanced age and her BP is controlled on spironolactone -I did discuss with the patient that spironolactone is the medical management for hyperaldosteronism, and we may remain on this as long as it is tolerated -We have opted to proceed with adrenal imaging, and will weigh the risk versus the benefit of proceeding with confirmation/localization versus continuing spironolactone -I am unable to increase her spironolactone at this time as her potassium  is at the upper limit of normal, per patient she has attempted to increase her spironolactone but she felt fatigued, I suspect this is due to dehydration and the concomitant use of hydrochlorothiazide -We have opted to reduce her amlodipine by 50% and rechecking in 7 weeks -Encouraged hydration   Medications : Continue spironolactone 25 mg daily Continue valsartan-HCTZ 320-25 mg daily Decrease amlodipine 5 mg daily  Follow-up in 7 weeks   I spent 45 minutes preparing to see the patient by review of recent labs, imaging and procedures, obtaining and reviewing separately obtained history, communicating with the patient, ordering medications, tests or procedures, and documenting clinical information in the EHR including the differential Dx, treatment, and any further evaluation and other management   Signed electronically by: Lyndle Herrlich, MD  [facility]  [facility] [address] [place] [place], [place] [address] Phone: [phone] FAX: [phone]   CC: Karie Schwalbe, MD [address] [place] [address] Phone: [phone] Fax: [phone]   Return to Endocrinology clinic as below: Future Appointments  Date Time Provider Department Center  08/20/2023 12:10 PM Nahara Dona, Konrad Dolores, MD  [facility] None  09/17/2023  7:05 AM [facility] DEVICE REMOTES [facility]  12/15/2023 10:15 AM Karie Schwalbe, MD [facility]  12/17/2023  7:05 AM [facility] DEVICE REMOTES [facility]  03/17/2024  7:05 AM [facility] DEVICE REMOTES [facility]

## 2023-07-03 NOTE — Patient Instructions (Signed)
Continue Spironolactone 25 mg, 1 tablet daily  Continue Valsartan-hydrochlorothiazide 320-25 mg , 1 tablet daily  Decrease Amlodipine ( Norvasc ) 10 mg to 5 mg daily ( You may take half a tablet daily of the current bottle but the new prescription will be the 5 mg tablet and take 1 tablet of that daily

## 2023-07-13 ENCOUNTER — Encounter: Payer: Self-pay | Admitting: Internal Medicine

## 2023-07-14 MED ORDER — METFORMIN HCL ER 500 MG PO TB24: 500.0000 mg | ORAL_TABLET | Freq: Every day | ORAL | 3 refills | Status: DC

## 2023-07-27 ENCOUNTER — Ambulatory Visit
Admission: RE | Admit: 2023-07-27 | Discharge: 2023-07-27 | Disposition: A | Discharge status: Home / Self Care | Payer: Medicare Other | Source: Ambulatory Visit | Attending: Internal Medicine | Admitting: Internal Medicine

## 2023-07-27 DIAGNOSIS — R7989 Other specified abnormal findings of blood chemistry: Secondary | ICD-10-CM

## 2023-07-27 DIAGNOSIS — K5730 Diverticulosis of large intestine without perforation or abscess without bleeding: Secondary | ICD-10-CM | POA: Diagnosis not present

## 2023-08-20 ENCOUNTER — Encounter: Payer: Self-pay | Admitting: Internal Medicine

## 2023-08-20 ENCOUNTER — Ambulatory Visit: Payer: Medicare Other | Admitting: Internal Medicine

## 2023-08-20 VITALS — BP 140/80 | HR 74 | Ht 62.0 in | Wt 163.0 lb

## 2023-08-20 DIAGNOSIS — R7989 Other specified abnormal findings of blood chemistry: Secondary | ICD-10-CM

## 2023-08-20 LAB — POTASSIUM: Potassium: 4.4 meq/L (ref 3.5–5.1)

## 2023-08-20 NOTE — Progress Notes (Signed)
Name: Kathy Acosta  MRN/ DOB: [id number], 11-19-1945    Age/ Sex: 77 y.o., female    PCP: Karie Schwalbe, MD   Reason for Endocrinology Evaluation: Elevated Aldo/PRA ratio     Date of Initial Endocrinology Evaluation: 07/03/2023    HPI: Ms. Kathy Acosta is a 77 y.o. 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 The patient presented for initial endocrinology clinic visit on 07/03/2023 for consultative assistance with her elevated Aldo/PRA ratio.   Patient was referred here for further evaluation of elevated Aldo/PRA ratio 08/2022 at 39 (reference 0.0-30.0)   She was diagnosed with HTN in her 30's Historically she has difficult to control BP but recently it has been well controlled  Denies recent headaches  Denies visual changes  Denies abdominal pain  Has occasional constipation  Denies LE edema  No recent palpitations since having the PPM   Pt on Spironolactone 25 mg daily which was started 01/2023  She tried to increase Spironolactone but she felt exhausted and drained      Both parents had HTN and some of her siblings    SUBJECTIVE:    Today (08/20/23): Kathy Acosta is here for follow-up on hypertension.  She checks BP at home daily with SBP 150 mmhg  Denies headaches  Denies chest pain or SOB  Denies constipation or diarrhea  Denies palpitations     Continue spironolactone 25 mg daily Continue valsartan-HCTZ 320-25 mg daily Decrease amlodipine 5 mg daily    HISTORY:  Past Medical History:  Past Medical History:  Diagnosis Date   Arthritis    Cataract    bilateral repair   Essential tremor    last 2 years   Hx of colonic polyps    Hypertension    Impaired fasting glucose    Past Surgical History:  Past Surgical History:  Procedure Laterality Date   ABDOMINAL HYSTERECTOMY     CATARACT EXTRACTION W/PHACO Right 07/12/2020   Procedure: CATARACT EXTRACTION PHACO AND INTRAOCULAR LENS  PLACEMENT (IOC) RIGHT;  Surgeon: Galen Manila, MD;  Location: [facility];  Service: Ophthalmology;  Laterality: Right;  7.35 0:46.4   CATARACT EXTRACTION W/PHACO Left 07/31/2020   Procedure: CATARACT EXTRACTION PHACO AND INTRAOCULAR LENS PLACEMENT (IOC) LEFT;  Surgeon: Galen Manila, MD;  Location: [facility];  Service: Ophthalmology;  Laterality: Left;  6.27 0:36.5   CERVICAL DISCECTOMY  1992   2 times   CESAREAN SECTION     2 times   COLONOSCOPY  2016   PACEMAKER IMPLANT N/A 12/12/2021   Procedure: PACEMAKER IMPLANT;  Surgeon: Lanier Prude, MD;  Location: [facility];  Service: Cardiovascular;  Laterality: N/A;    Social History:  reports that she has never smoked. She has been exposed to tobacco smoke. She has never used smokeless tobacco. She reports current alcohol use. She reports that she does not use drugs. Family History: family history includes Cancer in her paternal aunt; Colon cancer in her paternal aunt and sister; Diabetes in her brother, brother, and father; Heart disease in her sister; Hypertension in her father and mother.   HOME MEDICATIONS: Allergies as of 08/20/2023       Reactions   Doxazosin    dizziness        Medication List        Accurate as of August 20, 2023  7:33 AM. If you have any questions, ask your nurse or doctor.  acetaminophen 650 MG CR tablet Commonly known as: TYLENOL Take 1,300 mg by mouth every 8 (eight) hours as needed for pain.   amLODipine 5 MG tablet Commonly known as: NORVASC Take 1 tablet (5 mg total) by mouth daily.   ascorbic acid 500 MG tablet Commonly known as: VITAMIN C Take 500 mg by mouth daily.   CHROMIUM PICOLATE PO Take 1 capsule by mouth daily.   metFORMIN 500 MG 24 hr tablet Commonly known as: GLUCOPHAGE-XR Take 1 tablet (500 mg total) by mouth daily with breakfast.   MultiSource Calcium Mag/D Tabs Take 1 tablet by mouth daily.   multivitamin with minerals  Tabs tablet Take 1 tablet by mouth daily.   omeprazole 20 MG capsule Commonly known as: PRILOSEC TAKE 1 CAPSULE BY MOUTH DAILY   rosuvastatin 5 MG tablet Commonly known as: CRESTOR Take 1 tablet (5 mg total) by mouth daily.   spironolactone 25 MG tablet Commonly known as: ALDACTONE Take 1 tablet (25 mg total) by mouth daily.   valsartan-hydrochlorothiazide 320-25 MG tablet Commonly known as: DIOVAN-HCT Take 1 tablet by mouth daily.          REVIEW OF SYSTEMS: A comprehensive ROS was conducted with the patient and is negative except as per HPI    OBJECTIVE:  VS:BP (!) 140/80   Pulse 74   Ht 5\' 2"  (1.575 m)   Wt 163 lb (73.9 kg)   SpO2 98%   BMI 29.81 kg/m    Wt Readings from Last 3 Encounters:  07/03/23 160 lb 12.8 oz (72.9 kg)  05/14/23 162 lb (73.5 kg)  05/13/23 159 lb 8 oz (72.3 kg)     EXAM: General: Pt appears well and is in NAD  Lungs: Clear with good BS bilat   Heart: Auscultation: RRR.  Abdomen: Soft, nontender  Extremities:  BL LE: No pretibial edema   Mental Status: Judgment, insight: Intact Orientation: Oriented to time, place, and person Mood and affect: No depression, anxiety, or agitation     DATA REVIEWED:  Latest Reference Range & Units 08/20/23 12:40  Potassium 3.5 - 5.1 mEq/L 4.4     Renin - pending       CT abdomen 07/27/2023  FINDINGS: Lower chest: No acute findings.   Hepatobiliary: No mass visualized on this unenhanced exam. Gallbladder is unremarkable. No evidence of biliary ductal dilatation.   Pancreas: No mass or inflammatory process visualized on this unenhanced exam.   Spleen:  Within normal limits in size.   Adrenals/Urinary tract: Adrenal glands are normal in appearance. No adrenal masses are identified.   No evidence of urolithiasis or hydronephrosis. Unremarkable unopacified urinary bladder.   Stomach/Bowel: No evidence of obstruction, inflammatory process, or abnormal fluid collections. Normal  appendix visualized. Colonic diverticulosis is noted, without signs of diverticulitis.   Vascular/Lymphatic: No pathologically enlarged lymph nodes identified. No evidence of abdominal aortic aneurysm.   Reproductive:  No mass or other significant abnormality.   Other:  None.   Musculoskeletal:  No suspicious bone lesions identified.   IMPRESSION: No evidence of adrenal neoplasm.   Colonic diverticulosis, without radiographic evidence of diverticulitis.        ASSESSMENT/PLAN/RECOMMENDATIONS:   Abnormal aldosterone/PRA ratio:  -High suspicion for hyperaldosteronism -Patient not interested in surgical intervention, hence will not proceed with AVS no confirmation. -Unfortunately, she felt extremely fatigued when her spironolactone was increased in the past by different provider, and she is very reluctant in making any adjustments to it.  I did explain to the patient  that the plan is to increase her spironolactone and be able to reduce the rest of her antihypertensive medications but she is not ready for this at this time -Potassium is normal -Renin is pending -CT abdomen showed normal bilateral adrenal glands  Medications : Continue spironolactone 25 mg daily Continue valsartan-HCTZ 320-25 mg daily Continue  amlodipine 5 mg daily     Follow-up in 3 months   I spent 28 minutes preparing to see the patient by review of recent labs, imaging and procedures, obtaining and reviewing separately obtained history, communicating with the patient, ordering medications, tests or procedures, and documenting clinical information in the EHR including the differential Dx, treatment, and any further evaluation and other management   Signed electronically by: Lyndle Herrlich, MD  [facility] Endocrinology  [facility] [address] [place], [place] [address] Phone: [phone] FAX: [phone]   CC: Karie Schwalbe, MD [address]  [place] [address] Phone: [phone] Fax: [phone]   Return to Endocrinology clinic as below: Future Appointments  Date Time Provider Department Center  08/20/2023 12:10 PM Woodley Petzold, Konrad Dolores, MD [facility] None  09/17/2023  7:05 AM [facility] DEVICE REMOTES [facility]  12/15/2023 10:15 AM Karie Schwalbe, MD [facility]  12/17/2023  7:05 AM [facility] DEVICE REMOTES [facility]  03/17/2024  7:05 AM [facility] DEVICE REMOTES [facility]

## 2023-08-20 NOTE — Patient Instructions (Addendum)
Continue Spironolactone 25 mg, 1 tablet daily  Continue Valsartan-hydrochlorothiazide 320-25 mg , 1 tablet daily  Continue  Amlodipine ( Norvasc ) 5 mg daily

## 2023-08-25 LAB — RENIN: Renin Activity, Plasma: 1.46 ng/mL/h (ref 0.167–5.380)

## 2023-09-17 ENCOUNTER — Ambulatory Visit (INDEPENDENT_AMBULATORY_CARE_PROVIDER_SITE_OTHER): Payer: Medicare Other

## 2023-09-17 DIAGNOSIS — I442 Atrioventricular block, complete: Secondary | ICD-10-CM | POA: Diagnosis not present

## 2023-09-18 LAB — CUP PACEART REMOTE DEVICE CHECK
Battery Remaining Longevity: 135 mo
Battery Voltage: 3.02 V
Brady Statistic AP VP Percent: 0.78 %
Brady Statistic AP VS Percent: 0 %
Brady Statistic AS VP Percent: 99.16 %
Brady Statistic AS VS Percent: 0.06 %
Brady Statistic RA Percent Paced: 0.81 %
Brady Statistic RV Percent Paced: 99.94 %
Date Time Interrogation Session: 20241114100351
Implantable Lead Connection Status: 753985
Implantable Lead Connection Status: 753985
Implantable Lead Implant Date: 20230209
Implantable Lead Implant Date: 20230209
Implantable Lead Location: 753859
Implantable Lead Location: 753860
Implantable Lead Model: 3830
Implantable Lead Model: 5076
Implantable Pulse Generator Implant Date: 20230209
Lead Channel Impedance Value: 399 Ohm
Lead Channel Impedance Value: 456 Ohm
Lead Channel Impedance Value: 532 Ohm
Lead Channel Impedance Value: 589 Ohm
Lead Channel Pacing Threshold Amplitude: 0.375 V
Lead Channel Pacing Threshold Amplitude: 0.875 V
Lead Channel Pacing Threshold Pulse Width: 0.4 ms
Lead Channel Pacing Threshold Pulse Width: 0.4 ms
Lead Channel Sensing Intrinsic Amplitude: 1.625 mV
Lead Channel Sensing Intrinsic Amplitude: 1.625 mV
Lead Channel Sensing Intrinsic Amplitude: 12.125 mV
Lead Channel Sensing Intrinsic Amplitude: 13 mV
Lead Channel Setting Pacing Amplitude: 1.5 V
Lead Channel Setting Pacing Amplitude: 2 V
Lead Channel Setting Pacing Pulse Width: 0.4 ms
Lead Channel Setting Sensing Sensitivity: 1.2 mV
Zone Setting Status: 755011

## 2023-10-05 NOTE — Progress Notes (Signed)
Remote pacemaker transmission.   

## 2023-11-20 ENCOUNTER — Other Ambulatory Visit: Payer: Self-pay | Admitting: Internal Medicine

## 2023-12-09 ENCOUNTER — Encounter: Payer: Self-pay | Admitting: Internal Medicine

## 2023-12-09 ENCOUNTER — Ambulatory Visit: Payer: Medicare Other | Admitting: Internal Medicine

## 2023-12-09 VITALS — BP 130/84 | HR 75 | Ht 62.0 in | Wt 164.0 lb

## 2023-12-09 DIAGNOSIS — R7989 Other specified abnormal findings of blood chemistry: Secondary | ICD-10-CM | POA: Diagnosis not present

## 2023-12-09 MED ORDER — AMLODIPINE BESYLATE 5 MG PO TABS: 5.0000 mg | ORAL_TABLET | Freq: Every day | ORAL | 2 refills | Status: DC

## 2023-12-09 MED ORDER — SPIRONOLACTONE 25 MG PO TABS: 25.0000 mg | ORAL_TABLET | Freq: Every day | ORAL | 3 refills | Status: DC

## 2023-12-09 MED ORDER — VALSARTAN-HYDROCHLOROTHIAZIDE 320-25 MG PO TABS: 1.0000 | ORAL_TABLET | Freq: Every day | ORAL | 3 refills | Status: DC

## 2023-12-09 NOTE — Progress Notes (Signed)
 Name: Kathy Acosta  MRN/ DOB: [id number], 1946-05-04    Age/ Sex: 78 y.o., female    PCP: Jimmy Charlie FERNS, MD   Reason for Endocrinology Evaluation: Elevated Aldo/PRA ratio     Date of Initial Endocrinology Evaluation: 07/03/2023    HPI: Kathy Acosta is a 78 y.o. 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 The patient presented for initial endocrinology clinic visit on 07/03/2023 for consultative assistance with her elevated Aldo/PRA ratio.   Patient was referred here for further evaluation of elevated Aldo/PRA ratio 08/2022 at 39 (reference 0.0-30.0)   She was diagnosed with HTN in her 30's Historically she has difficult to control BP but recently it has been well controlled  Denies recent headaches  Denies visual changes  Denies abdominal pain  Has occasional constipation  Denies LE edema  No recent palpitations since having the PPM   Pt on Spironolactone  25 mg daily which was started 01/2023  She tried to increase Spironolactone  but she felt exhausted and drained      Both parents had HTN and some of her siblings    SUBJECTIVE:    Today (12/09/23): Kathy Acosta is here for follow-up on hypertension.  She checks BP at home occasionally SBP 123-160  Denies headaches  Denies chest pain or SOB  Denies constipation or diarrhea unless with metformin   Denies palpitations  Denies LE edema     spironolactone  25 mg daily valsartan -HCTZ 320-25 mg daily Amlodipine  5 mg daily    HISTORY:  Past Medical History:  Past Medical History:  Diagnosis Date   Arthritis    Cataract    bilateral repair   Essential tremor    last 2 years   Hx of colonic polyps    Hypertension    Impaired fasting glucose    Past Surgical History:  Past Surgical History:  Procedure Laterality Date   ABDOMINAL HYSTERECTOMY     CATARACT EXTRACTION W/PHACO Right 07/12/2020   Procedure: CATARACT EXTRACTION PHACO AND  INTRAOCULAR LENS PLACEMENT (IOC) RIGHT;  Surgeon: Jaye Fallow, MD;  Location: [facility];  Service: Ophthalmology;  Laterality: Right;  7.35 0:46.4   CATARACT EXTRACTION W/PHACO Left 07/31/2020   Procedure: CATARACT EXTRACTION PHACO AND INTRAOCULAR LENS PLACEMENT (IOC) LEFT;  Surgeon: Jaye Fallow, MD;  Location: [facility];  Service: Ophthalmology;  Laterality: Left;  6.27 0:36.5   CERVICAL DISCECTOMY  1992   2 times   CESAREAN SECTION     2 times   COLONOSCOPY  2016   PACEMAKER IMPLANT N/A 12/12/2021   Procedure: PACEMAKER IMPLANT;  Surgeon: Cindie Ole DASEN, MD;  Location: [facility];  Service: Cardiovascular;  Laterality: N/A;    Social History:  reports that she has never smoked. She has been exposed to tobacco smoke. She has never used smokeless tobacco. She reports current alcohol use. She reports that she does not use drugs. Family History: family history includes Cancer in her paternal aunt; Colon cancer in her paternal aunt and sister; Diabetes in her brother, brother, and father; Heart disease in her sister; Hypertension in her father and mother.   HOME MEDICATIONS: Allergies as of 12/09/2023       Reactions   Doxazosin     dizziness        Medication List        Accurate as of December 09, 2023 11:47 AM. If you have any questions, ask your nurse or  doctor.          STOP taking these medications    Comirnaty syringe Generic drug: COVID-19 mRNA vaccine Proofreader) Stopped by: Carolyn Maniscalco J Lanasia Porras       TAKE these medications    Abrysvo 120 MCG/0.5ML injection Generic drug: RSV bivalent vaccine   acetaminophen  650 MG CR tablet Commonly known as: TYLENOL  Take 1,300 mg by mouth every 8 (eight) hours as needed for pain.   amLODipine  5 MG tablet Commonly known as: NORVASC  TAKE 1 TABLET BY MOUTH DAILY   ascorbic acid 500 MG tablet Commonly known as: VITAMIN C Take 500 mg by mouth daily.   CHROMIUM PICOLATE PO Take 1  capsule by mouth daily.   Fluad 0.5 ML injection Generic drug: influenza vaccine adjuvanted   metFORMIN  500 MG 24 hr tablet Commonly known as: GLUCOPHAGE -XR Take 1 tablet (500 mg total) by mouth daily with breakfast.   MultiSource Calcium  Mag/D Tabs Take 1 tablet by mouth daily.   multivitamin with minerals Tabs tablet Take 1 tablet by mouth daily.   omeprazole  20 MG capsule Commonly known as: PRILOSEC TAKE 1 CAPSULE BY MOUTH DAILY   Pneumovax 23 25 MCG/0.5ML injection Generic drug: pneumococcal 23 valent vaccine   rosuvastatin  5 MG tablet Commonly known as: CRESTOR  Take 1 tablet (5 mg total) by mouth daily.   spironolactone  25 MG tablet Commonly known as: ALDACTONE  Take 1 tablet (25 mg total) by mouth daily.   valsartan -hydrochlorothiazide  320-25 MG tablet Commonly known as: DIOVAN -HCT Take 1 tablet by mouth daily.          REVIEW OF SYSTEMS: A comprehensive ROS was conducted with the patient and is negative except as per HPI    OBJECTIVE:  VS:BP 130/84 (BP Location: Left Arm, Patient Position: Sitting, Cuff Size: Normal)   Pulse 75   Ht 5' 2 (1.575 m)   Wt 164 lb (74.4 kg)   SpO2 99%   BMI 30.00 kg/m    Wt Readings from Last 3 Encounters:  12/09/23 164 lb (74.4 kg)  08/20/23 163 lb (73.9 kg)  07/03/23 160 lb 12.8 oz (72.9 kg)     EXAM: General: Pt appears well and is in NAD  Lungs: Clear with good BS bilat   Heart: Auscultation: RRR.  Extremities:  BL LE: No pretibial edema   Mental Status: Judgment, insight: Intact Orientation: Oriented to time, place, and person Mood and affect: No depression, anxiety, or agitation     DATA REVIEWED:  Latest Reference Range & Units 08/20/23 12:40  Potassium 3.5 - 5.1 mEq/L 4.4    Latest Reference Range & Units 08/20/23 12:40  Renin Activity, Plasma 0.167 - 5.380 ng/mL/hr 1.460    CT abdomen 07/27/2023  FINDINGS: Lower chest: No acute findings.   Hepatobiliary: No mass visualized on this  unenhanced exam. Gallbladder is unremarkable. No evidence of biliary ductal dilatation.   Pancreas: No mass or inflammatory process visualized on this unenhanced exam.   Spleen:  Within normal limits in size.   Adrenals/Urinary tract: Adrenal glands are normal in appearance. No adrenal masses are identified.   No evidence of urolithiasis or hydronephrosis. Unremarkable unopacified urinary bladder.   Stomach/Bowel: No evidence of obstruction, inflammatory process, or abnormal fluid collections. Normal appendix visualized. Colonic diverticulosis is noted, without signs of diverticulitis.   Vascular/Lymphatic: No pathologically enlarged lymph nodes identified. No evidence of abdominal aortic aneurysm.   Reproductive:  No mass or other significant abnormality.   Other:  None.   Musculoskeletal:  No suspicious  bone lesions identified.   IMPRESSION: No evidence of adrenal neoplasm.   Colonic diverticulosis, without radiographic evidence of diverticulitis.        ASSESSMENT/PLAN/RECOMMENDATIONS:   Abnormal aldosterone/PRA ratio:  -High suspicion for hyperaldosteronism -Patient not interested in surgical intervention, hence will not proceed with AVS for confirmation. -Unfortunately, she felt extremely fatigued when her spironolactone  was increased in the past by different provider, and she is very reluctant in making any adjustments to it.  I did explain to the patient that the plan is to increase her spironolactone  and be able to reduce the rest of her antihypertensive medications but she has not been ready  -CT abdomen showed normal bilateral adrenal glands - No changes  Medications : Continue spironolactone  25 mg daily Continue valsartan -HCTZ 320-25 mg daily Continue  amlodipine  5 mg daily     Follow-up in 6 months    Signed electronically by: Stefano Redgie Butts, MD  [facility] Endocrinology  [facility] [address] [place] [place], [place] [address] Phone: [phone] FAX: [phone]   CC: Jimmy Charlie FERNS, MD [address] [place] [address] Phone: [phone] Fax: [phone]   Return to Endocrinology clinic as below: Future Appointments  Date Time Provider Department Center  12/09/2023 11:50 AM Eissa Buchberger, Donell Redgie, MD [facility] None  12/15/2023 10:15 AM Jimmy Charlie FERNS, MD [facility]  12/17/2023  7:05 AM [facility] DEVICE REMOTES [facility]  03/17/2024  7:05 AM [facility] DEVICE REMOTES [facility]

## 2023-12-09 NOTE — Patient Instructions (Signed)
Continue Spironolactone 25 mg, 1 tablet daily  Continue Valsartan-hydrochlorothiazide 320-25 mg , 1 tablet daily  Continue  Amlodipine ( Norvasc ) 5 mg daily

## 2023-12-15 ENCOUNTER — Encounter: Payer: Self-pay | Admitting: Internal Medicine

## 2023-12-15 ENCOUNTER — Ambulatory Visit: Payer: Medicare Other | Admitting: Internal Medicine

## 2023-12-15 VITALS — BP 148/80 | HR 63 | Temp 98.6°F | Ht 62.0 in | Wt 164.0 lb

## 2023-12-15 DIAGNOSIS — I442 Atrioventricular block, complete: Secondary | ICD-10-CM

## 2023-12-15 DIAGNOSIS — Z0000 Encounter for general adult medical examination without abnormal findings: Secondary | ICD-10-CM

## 2023-12-15 DIAGNOSIS — K219 Gastro-esophageal reflux disease without esophagitis: Secondary | ICD-10-CM | POA: Diagnosis not present

## 2023-12-15 DIAGNOSIS — I10 Essential (primary) hypertension: Secondary | ICD-10-CM

## 2023-12-15 DIAGNOSIS — E269 Hyperaldosteronism, unspecified: Secondary | ICD-10-CM | POA: Diagnosis not present

## 2023-12-15 DIAGNOSIS — R7303 Prediabetes: Secondary | ICD-10-CM | POA: Diagnosis not present

## 2023-12-15 DIAGNOSIS — I700 Atherosclerosis of aorta: Secondary | ICD-10-CM

## 2023-12-15 LAB — COMPREHENSIVE METABOLIC PANEL
ALT: 30 U/L (ref 0–35)
AST: 33 U/L (ref 0–37)
Albumin: 4.3 g/dL (ref 3.5–5.2)
Alkaline Phosphatase: 62 U/L (ref 39–117)
BUN: 26 mg/dL — ABNORMAL HIGH (ref 6–23)
CO2: 30 meq/L (ref 19–32)
Calcium: 9.7 mg/dL (ref 8.4–10.5)
Chloride: 102 meq/L (ref 96–112)
Creatinine, Ser: 0.92 mg/dL (ref 0.40–1.20)
GFR: 59.22 mL/min — ABNORMAL LOW (ref >60.00)
Glucose, Bld: 93 mg/dL (ref 70–99)
Potassium: 4.6 meq/L (ref 3.5–5.1)
Sodium: 139 meq/L (ref 135–145)
Total Bilirubin: 0.4 mg/dL (ref 0.2–1.2)
Total Protein: 7.2 g/dL (ref 6.0–8.3)

## 2023-12-15 LAB — CBC
HCT: 39.6 % (ref 36.0–46.0)
Hemoglobin: 12.8 g/dL (ref 12.0–15.0)
MCHC: 32.2 g/dL (ref 30.0–36.0)
MCV: 87.8 fL (ref 78.0–100.0)
Platelets: 231 10*3/uL (ref 150.0–400.0)
RBC: 4.51 Mil/uL (ref 3.87–5.11)
RDW: 13.7 % (ref 11.5–15.5)
WBC: 5 10*3/uL (ref 4.0–10.5)

## 2023-12-15 LAB — LIPID PANEL
Cholesterol: 139 mg/dL (ref 0–200)
HDL: 48 mg/dL (ref >39.00)
LDL Cholesterol: 62 mg/dL (ref 0–99)
NonHDL: 90.82
Total CHOL/HDL Ratio: 3
Triglycerides: 145 mg/dL (ref 0.0–149.0)
VLDL: 29 mg/dL (ref 0.0–40.0)

## 2023-12-15 LAB — HEMOGLOBIN A1C: Hgb A1c MFr Bld: 6 % (ref 4.6–6.5)

## 2023-12-15 LAB — TSH: TSH: 1.23 u[IU]/mL (ref 0.35–5.50)

## 2023-12-15 MED ORDER — OMEPRAZOLE 20 MG PO CPDR: 20.0000 mg | DELAYED_RELEASE_CAPSULE | Freq: Every day | ORAL | 3 refills | Status: AC

## 2023-12-15 NOTE — Assessment & Plan Note (Signed)
I have personally reviewed the Medicare Annual Wellness questionnaire and have noted 1. The patient's medical and social history 2. Their use of alcohol, tobacco or illicit drugs 3. Their current medications and supplements 4. The patient's functional ability including ADL's, fall risks, home safety risks and hearing or visual             impairment. 5. Diet and physical activities 6. Evidence for depression or mood disorders  The patients weight, height, BMI and visual acuity have been recorded in the chart I have made referrals, counseling and provided education to the patient based review of the above and I have provided the Kathy Acosta with a written personalized care plan for preventive services.  I have provided you with a copy of your personalized plan for preventive services. Please take the time to review along with your updated medication list.  Done with colonoscopies Recommended one last mammogram--she will set up Plans to go to the gym again UTD on all immunizations (did have COVID also)

## 2023-12-15 NOTE — Assessment & Plan Note (Signed)
Uses the omeprazole prn

## 2023-12-15 NOTE — Assessment & Plan Note (Signed)
Doing well with pacemaker

## 2023-12-15 NOTE — Assessment & Plan Note (Signed)
On imaging Is on the rosuvastatin

## 2023-12-15 NOTE — Progress Notes (Signed)
Subjective:    Patient ID: Kathy Acosta, female    DOB: 09/12/1946, 78 y.o.   MRN: [id number]  HPI Here for Medicare wellness visit and follow up of chronic health conditions Reviewed advanced directives Reviewed other doctors---[facility], Dr End--cardiology, Dr Thomasene Lot, Dr Potter--neurology, Dr Kerri Perches No hospitalizations or surgery in the past year Exercises---walking. Inconsistent. Still no resistance Vision is fine Hearing is okay Rare glass of wine No tobacco No falls No depression or anhedonia Independent with instrumental ADLs No sig memory issues--just some brief recall issues  Continues with cardiology and did see endocrine At home--mostly 130/80  No headache No chest pain or SOB No dizziness or syncope No edema--except if she is sitting prolonged (like travel) Known aortic atherosclerosis Doing well with the pacemaker---heart block  Doing okay Slight sniffles in past day No fever and not really sick  Labs reviewed Last GFRs have been in the 50's  Doesn't like the metformin---causes constipation Cut down to every other day---now 1/2 twice a week Is careful about eating Weight is up some--but working on this  Occasional heartburn--uses the omeprazole prn No dysphagia  Current Outpatient Medications on File Prior to Visit  Medication Sig Dispense Refill   acetaminophen (TYLENOL) 650 MG CR tablet Take 1,300 mg by mouth every 8 (eight) hours as needed for pain.     amLODipine (NORVASC) 5 MG tablet Take 1 tablet (5 mg total) by mouth daily. 90 tablet 2   ascorbic acid (VITAMIN C) 500 MG tablet Take 500 mg by mouth daily.     Multiple Minerals-Vitamins (MULTISOURCE CALCIUM MAG/D) TABS Take 1 tablet by mouth daily.     Multiple Vitamin (MULTIVITAMIN WITH MINERALS) TABS tablet Take 1 tablet by mouth daily.     omeprazole (PRILOSEC) 20 MG capsule TAKE 1 CAPSULE BY MOUTH DAILY 90 capsule 3   rosuvastatin (CRESTOR) 5 MG tablet  Take 1 tablet (5 mg total) by mouth daily. 90 tablet 3   spironolactone (ALDACTONE) 25 MG tablet Take 1 tablet (25 mg total) by mouth daily. 90 tablet 3   valsartan-hydrochlorothiazide (DIOVAN-HCT) 320-25 MG tablet Take 1 tablet by mouth daily. 90 tablet 3   No current facility-administered medications on file prior to visit.    Allergies  Allergen Reactions   Doxazosin     dizziness    Past Medical History:  Diagnosis Date   Arthritis    Cataract    bilateral repair   Essential tremor    last 2 years   GERD (gastroesophageal reflux disease)    Hx of colonic polyps    Hypertension    Impaired fasting glucose     Past Surgical History:  Procedure Laterality Date   ABDOMINAL HYSTERECTOMY     CATARACT EXTRACTION W/PHACO Right 07/12/2020   Procedure: CATARACT EXTRACTION PHACO AND INTRAOCULAR LENS PLACEMENT (IOC) RIGHT;  Surgeon: Galen Manila, MD;  Location: [facility];  Service: Ophthalmology;  Laterality: Right;  7.35 0:46.4   CATARACT EXTRACTION W/PHACO Left 07/31/2020   Procedure: CATARACT EXTRACTION PHACO AND INTRAOCULAR LENS PLACEMENT (IOC) LEFT;  Surgeon: Galen Manila, MD;  Location: [facility];  Service: Ophthalmology;  Laterality: Left;  6.27 0:36.5   CERVICAL DISCECTOMY  1992   2 times   CESAREAN SECTION     2 times   COLONOSCOPY  2016   PACEMAKER IMPLANT N/A 12/12/2021   Procedure: PACEMAKER IMPLANT;  Surgeon: Lanier Prude, MD;  Location: [facility];  Service: Cardiovascular;  Laterality: N/A;  TUBAL LIGATION      Family History  Problem Relation Age of Onset   Hypertension Mother    Hypertension Father    Diabetes Father    Diabetes Brother    Cancer Paternal Aunt        colon   Colon cancer Paternal Aunt    Diabetes Brother    Heart disease Sister    Colon cancer Sister    Colon polyps Neg Hx    Esophageal cancer Neg Hx    Stomach cancer Neg Hx    Rectal cancer Neg Hx     Social History   Socioeconomic  History   Marital status: Married    Spouse name: Not on file   Number of children: 2   Years of education: Not on file   Highest education level: Not on file  Occupational History   Occupation: retired- Geologist, engineering. [employer]  Tobacco Use   Smoking status: Never    Passive exposure: Past   Smokeless tobacco: Never   Tobacco comments:    NEVER SMOKED!  Vaping Use   Vaping status: Never Used  Substance and Sexual Activity   Alcohol use: Not Currently    Comment: glass of wine - once a month   Drug use: No   Sexual activity: Not Currently    Birth control/protection: Surgical  Other Topics Concern   Not on file  Social History Narrative   No living will   Requests sons to be health care POA   Would like attempts at resuscitation.   Probably wouldn't want tube feeds if cognitively unaware   Social Drivers of Corporate investment banker Strain: Not on file  Food Insecurity: Not on file  Transportation Needs: Not on file  Physical Activity: Not on file  Stress: Not on file  Social Connections: Not on file  Intimate Partner Violence: Not on file   Review of Systems Appetite is fine Sleeps okay mostly---occasional night awakening (will read a book). Not tired in day usually Wears seat belt Teeth are okay--keeps up with dentist Has spot on upper chest to be checked--no dermatologist Bowels move fine except for the metformin Gets some back pain---?related to sleep. Loosens up after a while. Tylenol helps some. No other sig joint issues Some numbness just at tip of left 3rd finger--no other areas    Objective:   Physical Exam Constitutional:      Appearance: Normal appearance.  HENT:     Mouth/Throat:     Pharynx: No oropharyngeal exudate or posterior oropharyngeal erythema.  Eyes:     Conjunctiva/sclera: Conjunctivae normal.     Pupils: Pupils are equal, round, and reactive to light.  Cardiovascular:     Rate and Rhythm: Normal rate and regular rhythm.      Pulses: Normal pulses.     Heart sounds: No murmur heard.    No gallop.  Pulmonary:     Effort: Pulmonary effort is normal.     Breath sounds: Normal breath sounds. No wheezing or rales.  Abdominal:     Palpations: Abdomen is soft.     Tenderness: There is no abdominal tenderness.  Musculoskeletal:     Cervical back: Neck supple.     Right lower leg: No edema.     Left lower leg: No edema.  Lymphadenopathy:     Cervical: No cervical adenopathy.  Skin:    Findings: No rash.     Comments: Benign lesion on chest  Neurological:  General: No focal deficit present.     Mental Status: She is alert and oriented to person, place, and time.     Comments: Mini--cog normal  Psychiatric:        Mood and Affect: Mood normal.        Behavior: Behavior normal.            Assessment & Plan:

## 2023-12-15 NOTE — Assessment & Plan Note (Signed)
BP Readings from Last 3 Encounters:  12/15/23 (!) 148/80  12/09/23 130/84  08/20/23 (!) 140/80   Home BP is better On amlodipine 5, spironolactone 25, valsartan/hydrochlorothiazide 320/25 Will leave any changes up to cardiology

## 2023-12-15 NOTE — Progress Notes (Signed)
Hearing Screening - Comments:: whisper test Vision Screening - Comments:: Sending for records from [facility] had exam in May

## 2023-12-15 NOTE — Assessment & Plan Note (Signed)
Hasn't really been taking the metformin---due to side effects Consider low dose glipizide depending on the results

## 2023-12-15 NOTE — Assessment & Plan Note (Signed)
Is on the spironolactone Dr Lonzo Cloud considering increasing this--but her BP seems to be good at home

## 2023-12-16 ENCOUNTER — Encounter: Payer: Self-pay | Admitting: Internal Medicine

## 2023-12-17 ENCOUNTER — Ambulatory Visit (INDEPENDENT_AMBULATORY_CARE_PROVIDER_SITE_OTHER): Payer: Medicare Other

## 2023-12-17 DIAGNOSIS — I442 Atrioventricular block, complete: Secondary | ICD-10-CM

## 2023-12-19 LAB — CUP PACEART REMOTE DEVICE CHECK
Battery Remaining Longevity: 132 mo
Battery Voltage: 3.02 V
Brady Statistic AP VP Percent: 1.47 %
Brady Statistic AP VS Percent: 0 %
Brady Statistic AS VP Percent: 98.5 %
Brady Statistic AS VS Percent: 0.03 %
Brady Statistic RA Percent Paced: 1.47 %
Brady Statistic RV Percent Paced: 99.97 %
Date Time Interrogation Session: 20250212190734
Implantable Lead Connection Status: 753985
Implantable Lead Connection Status: 753985
Implantable Lead Implant Date: 20230209
Implantable Lead Implant Date: 20230209
Implantable Lead Location: 753859
Implantable Lead Location: 753860
Implantable Lead Model: 3830
Implantable Lead Model: 5076
Implantable Pulse Generator Implant Date: 20230209
Lead Channel Impedance Value: 399 Ohm
Lead Channel Impedance Value: 456 Ohm
Lead Channel Impedance Value: 570 Ohm
Lead Channel Impedance Value: 589 Ohm
Lead Channel Pacing Threshold Amplitude: 0.375 V
Lead Channel Pacing Threshold Amplitude: 0.875 V
Lead Channel Pacing Threshold Pulse Width: 0.4 ms
Lead Channel Pacing Threshold Pulse Width: 0.4 ms
Lead Channel Sensing Intrinsic Amplitude: 1.75 mV
Lead Channel Sensing Intrinsic Amplitude: 1.75 mV
Lead Channel Sensing Intrinsic Amplitude: 12.125 mV
Lead Channel Sensing Intrinsic Amplitude: 13 mV
Lead Channel Setting Pacing Amplitude: 1.5 V
Lead Channel Setting Pacing Amplitude: 2 V
Lead Channel Setting Pacing Pulse Width: 0.4 ms
Lead Channel Setting Sensing Sensitivity: 1.2 mV
Zone Setting Status: 755011

## 2023-12-21 ENCOUNTER — Other Ambulatory Visit: Payer: Self-pay | Admitting: Internal Medicine

## 2023-12-21 DIAGNOSIS — Z1231 Encounter for screening mammogram for malignant neoplasm of breast: Secondary | ICD-10-CM

## 2023-12-23 ENCOUNTER — Encounter: Payer: Self-pay | Admitting: Cardiology

## 2023-12-31 ENCOUNTER — Ambulatory Visit
Admission: RE | Admit: 2023-12-31 | Discharge: 2023-12-31 | Disposition: A | Discharge status: Home / Self Care | Payer: Medicare Other | Source: Ambulatory Visit | Attending: Internal Medicine | Admitting: Internal Medicine

## 2023-12-31 DIAGNOSIS — Z1231 Encounter for screening mammogram for malignant neoplasm of breast: Secondary | ICD-10-CM

## 2024-01-04 ENCOUNTER — Encounter: Payer: Self-pay | Admitting: Internal Medicine

## 2024-01-20 NOTE — Progress Notes (Signed)
 Remote pacemaker transmission.

## 2024-01-20 NOTE — Addendum Note (Signed)
 Addended by: Elease Etienne A on: 01/20/2024 09:20 AM   Modules accepted: Orders

## 2024-02-18 ENCOUNTER — Ambulatory Visit: Payer: Medicare Other | Attending: Internal Medicine | Admitting: Internal Medicine

## 2024-02-18 ENCOUNTER — Encounter: Payer: Self-pay | Admitting: Internal Medicine

## 2024-02-18 VITALS — BP 138/88 | HR 76 | Ht 62.0 in | Wt 166.2 lb

## 2024-02-18 DIAGNOSIS — E785 Hyperlipidemia, unspecified: Secondary | ICD-10-CM | POA: Diagnosis not present

## 2024-02-18 DIAGNOSIS — I152 Hypertension secondary to endocrine disorders: Secondary | ICD-10-CM | POA: Diagnosis not present

## 2024-02-18 DIAGNOSIS — I442 Atrioventricular block, complete: Secondary | ICD-10-CM | POA: Diagnosis not present

## 2024-02-18 DIAGNOSIS — I700 Atherosclerosis of aorta: Secondary | ICD-10-CM | POA: Diagnosis not present

## 2024-02-18 DIAGNOSIS — E269 Hyperaldosteronism, unspecified: Secondary | ICD-10-CM

## 2024-02-18 MED ORDER — AMLODIPINE BESYLATE 10 MG PO TABS: 10.0000 mg | ORAL_TABLET | Freq: Every day | ORAL | 3 refills | Status: DC

## 2024-02-18 NOTE — Patient Instructions (Signed)
 Medication Instructions:  Your physician recommends the following medication changes.  INCREASE: Amlodipine to 10 mg daily    *If you need a refill on your cardiac medications before your next appointment, please call your pharmacy*  Lab Work: No labs ordered today  If you have labs (blood work) drawn today and your tests are completely normal, you will receive your results only by: MyChart Message (if you have MyChart) OR A paper copy in the mail If you have any lab test that is abnormal or we need to change your treatment, we will call you to review the results.  Testing/Procedures: No test ordered today   Follow-Up: At [facility], you and your health needs are our priority.  As part of our continuing mission to provide you with exceptional heart care, our providers are all part of one team.  This team includes your primary Cardiologist (physician) and Advanced Practice Providers or APPs (Physician Assistants and Nurse Practitioners) who all work together to provide you with the care you need, when you need it.  Your next appointment:   6 month(s)  Provider:   You may see Sammy Crisp, MD or one of the following Advanced Practice Providers on your designated Care Team:   Laneta Pintos, NP Gildardo Labrador, PA-C Varney Gentleman, PA-C Cadence [place], PA-C Ronald Cockayne, NP Morey Ar, NP    We recommend signing up for the patient portal called "MyChart".  Sign up information is provided on this After Visit Summary.  MyChart is used to connect with patients for Virtual Visits (Telemedicine).  Patients are able to view lab/test results, encounter notes, upcoming appointments, etc.  Non-urgent messages can be sent to your provider as well.   To learn more about what you can do with MyChart, go to [email or web address].

## 2024-02-18 NOTE — Progress Notes (Signed)
 Cardiology Office Note:  .   Date:  02/18/2024  ID:  Kathy Acosta, DOB [birth year], MRN [id number] PCP: Kathy Llanos, MD  [facility] Providers Cardiologist:  Sammy Crisp, MD Electrophysiologist:  Boyce Byes, MD     History of Present Illness: .   Kathy Acosta is a 78 y.o. 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  I last saw her in 05/2019 for, at which time she was recovering from a recent viral illness that she contracted while visiting [place] .  She otherwise had been feeling well.  We had previously tried increasing spironolactone to 50 mg daily, though she did not tolerate this due to fatigue and dyspnea.  Her blood pressure was well-controlled on last check.  She was scheduled to see endocrinology in 06/2023.  CT abdomen showed normal bilateral adrenal glands.  Kathy Acosta declined further workup of her hyperaldosteronism given adequate blood pressure control on low-dose spironolactone.  Today, as well as reports that she has been feeling very well.  She notes that she does not exercise regularly but remains quite busy at home doing activities around the house.  She denies chest pain, shortness of breath, palpitations, lightheadedness, and edema.  She notes that her amlodipine was decreased at her endocrinology visit in August in an effort to simplify her medication regimen.  Her home blood pressures are now typically in the 130s to 140s over 80s.  ROS: See HPI  Studies Reviewed: Aaron Aas   EKG Interpretation Date/Time:  Thursday February 18 2024 08:20:58 EDT Ventricular Rate:  68 PR Interval:  162 QRS Duration:  148 QT Interval:  426 QTC Calculation: 452 R Axis:   -55  Text Interpretation: Atrial-sensed ventricular-paced rhythm Abnormal ECG When compared with ECG of 13-May-2023  09:49, No significant change was found Confirmed by Wilena Tyndall, Veryl Gottron [phone]) on 02/18/2024 8:27:07 AM    Risk Assessment/Calculations:             Physical Exam:   VS:  BP 138/88 (BP Location: Left Arm, Patient Position: Sitting, Cuff Size: Normal)   Pulse 76   Ht 5\' 2"  (1.575 m)   Wt 166 lb 3.2 oz (75.4 kg)   SpO2 98%   BMI 30.40 kg/m    Wt Readings from Last 3 Encounters:  02/18/24 166 lb 3.2 oz (75.4 kg)  12/15/23 164 lb (74.4 kg)  12/09/23 164 lb (74.4 kg)    General:  NAD. Neck: No JVD or HJR. Lungs: Clear to auscultation bilaterally without wheezes or crackles. Heart: Regular rate and rhythm without murmurs, rubs, or gallops. Abdomen: Soft, nontender, nondistended. Extremities: No lower extremity edema.  ASSESSMENT AND PLAN: .    Hyperaldosteronism and secondary hypertension: Blood pressure upper normal today and typically upper normal to mildly elevated at home.  Unfortunate, Kathy Acosta did not tolerate higher doses of spironolactone due to fatigue and dyspnea.  Will therefore continue spironolactone 25 mg daily with ongoing surveillance through her endocrinologist.  We have agreed to increase amlodipine back to 10 mg daily.  Continue current regimen of valsartan-HCTZ as well.  Renal function and electrolytes stable on last check in 12/2023.  Complete heart block status post pacemaker: Kathy Acosta without symptoms of high-grade AV block with EKG today again showing atrially sensed and ventricularly paced rhythm.  Continue ongoing device follow-up through the device clinic.  Hyperlipidemia and aortic  atherosclerosis: Continue rosuvastatin with ongoing follow-up through Dr. Joelle Musca.    Dispo: Return to clinic in 6 months.  Signed, Sammy Crisp, MD

## 2024-03-17 ENCOUNTER — Ambulatory Visit (INDEPENDENT_AMBULATORY_CARE_PROVIDER_SITE_OTHER): Payer: Medicare Other

## 2024-03-17 DIAGNOSIS — I442 Atrioventricular block, complete: Secondary | ICD-10-CM | POA: Diagnosis not present

## 2024-03-17 LAB — CUP PACEART REMOTE DEVICE CHECK
Battery Remaining Longevity: 129 mo
Battery Voltage: 3.01 V
Brady Statistic AP VP Percent: 1.59 %
Brady Statistic AP VS Percent: 0 %
Brady Statistic AS VP Percent: 98.37 %
Brady Statistic AS VS Percent: 0.04 %
Brady Statistic RA Percent Paced: 1.58 %
Brady Statistic RV Percent Paced: 99.96 %
Date Time Interrogation Session: 20250514190829
Implantable Lead Connection Status: 753985
Implantable Lead Connection Status: 753985
Implantable Lead Implant Date: 20230209
Implantable Lead Implant Date: 20230209
Implantable Lead Location: 753859
Implantable Lead Location: 753860
Implantable Lead Model: 3830
Implantable Lead Model: 5076
Implantable Pulse Generator Implant Date: 20230209
Lead Channel Impedance Value: 399 Ohm
Lead Channel Impedance Value: 456 Ohm
Lead Channel Impedance Value: 532 Ohm
Lead Channel Impedance Value: 589 Ohm
Lead Channel Pacing Threshold Amplitude: 0.25 V
Lead Channel Pacing Threshold Amplitude: 0.875 V
Lead Channel Pacing Threshold Pulse Width: 0.4 ms
Lead Channel Pacing Threshold Pulse Width: 0.4 ms
Lead Channel Sensing Intrinsic Amplitude: 12.125 mV
Lead Channel Sensing Intrinsic Amplitude: 13 mV
Lead Channel Sensing Intrinsic Amplitude: 2.125 mV
Lead Channel Sensing Intrinsic Amplitude: 2.125 mV
Lead Channel Setting Pacing Amplitude: 1.5 V
Lead Channel Setting Pacing Amplitude: 2 V
Lead Channel Setting Pacing Pulse Width: 0.4 ms
Lead Channel Setting Sensing Sensitivity: 1.2 mV
Zone Setting Status: 755011

## 2024-03-20 ENCOUNTER — Ambulatory Visit: Payer: Self-pay | Admitting: Cardiology

## 2024-04-04 DIAGNOSIS — H43813 Vitreous degeneration, bilateral: Secondary | ICD-10-CM | POA: Diagnosis not present

## 2024-04-04 DIAGNOSIS — M3501 Sicca syndrome with keratoconjunctivitis: Secondary | ICD-10-CM | POA: Diagnosis not present

## 2024-04-04 DIAGNOSIS — Z961 Presence of intraocular lens: Secondary | ICD-10-CM | POA: Diagnosis not present

## 2024-04-26 NOTE — Progress Notes (Signed)
 Remote pacemaker transmission.

## 2024-05-03 ENCOUNTER — Telehealth: Payer: Self-pay | Admitting: Internal Medicine

## 2024-05-03 DIAGNOSIS — R21 Rash and other nonspecific skin eruption: Secondary | ICD-10-CM

## 2024-05-03 NOTE — Telephone Encounter (Signed)
 Spoke to pt

## 2024-05-03 NOTE — Addendum Note (Signed)
 Addended by: JIMMY ADE I on: 05/03/2024 12:28 PM   Modules accepted: Orders

## 2024-05-03 NOTE — Telephone Encounter (Signed)
 Copied from CRM [id number]. Topic: Referral - Request for Referral >> May 02, 2024  4:22 PM Drema MATSU wrote: Did the patient discuss referral with their provider in the last year? Yes (If No - schedule appointment) (If Yes - send message)  Appointment offered? No  Type of order/referral and detailed reason for visit: referral to Dermatology  rash on the side of hair   Preference of office, provider, location: [facility] in [place] [phone]  If referral order, have you been seen by this specialty before? No (If Yes, this issue or another issue? When? Where?  Can we respond through MyChart? Yes

## 2024-05-05 ENCOUNTER — Other Ambulatory Visit: Payer: Self-pay

## 2024-05-05 MED ORDER — AMLODIPINE BESYLATE 10 MG PO TABS: 10.0000 mg | ORAL_TABLET | Freq: Every day | ORAL | 2 refills | Status: DC

## 2024-05-22 ENCOUNTER — Other Ambulatory Visit: Payer: Self-pay | Admitting: Internal Medicine

## 2024-06-07 ENCOUNTER — Encounter: Payer: Self-pay | Admitting: Internal Medicine

## 2024-06-07 ENCOUNTER — Ambulatory Visit: Payer: Medicare Other | Admitting: Internal Medicine

## 2024-06-07 VITALS — BP 128/70 | HR 74 | Ht 62.0 in | Wt 165.0 lb

## 2024-06-07 DIAGNOSIS — R7989 Other specified abnormal findings of blood chemistry: Secondary | ICD-10-CM | POA: Diagnosis not present

## 2024-06-07 MED ORDER — SPIRONOLACTONE 25 MG PO TABS: 25.0000 mg | ORAL_TABLET | Freq: Every day | ORAL | 3 refills | Status: AC

## 2024-06-07 NOTE — Progress Notes (Unsigned)
 Name: Kathy Acosta  MRN/ DOB: [id number], 1946/02/23    Age/ Sex: 78 y.o., female    PCP: Jimmy Charlie FERNS, MD   Reason for Endocrinology Evaluation: Elevated Aldo/PRA ratio     Date of Initial Endocrinology Evaluation: 07/03/2023    HPI: Kathy Acosta is a 78 y.o. 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 The patient presented for initial endocrinology clinic visit on 07/03/2023 for consultative assistance with her elevated Aldo/PRA ratio.   Patient was referred here for further evaluation of elevated Aldo/PRA ratio 08/2022 at 39 (reference 0.0-30.0)   She was diagnosed with HTN in her 30's Historically she has difficult to control BP but recently it has been well controlled  Denies recent headaches  Denies visual changes  Denies abdominal pain  Has occasional constipation  Denies LE edema  No recent palpitations since having the PPM   Pt on Spironolactone  25 mg daily which was started 01/2023  She tried to increase Spironolactone  but she felt exhausted and drained      Both parents had HTN and some of her siblings    SUBJECTIVE:    Today (06/07/24): Kathy Acosta is here for follow-up on hypertension.  She continues with occasional BP  at home occasionally SBP 128-151 mmhg   She had a recent follow-up with cardiology, prompting increasing amlodipine   Denies headaches  Denies chest pain or SOB  Denies LE edema  Denies constipation or diarrhea Denies palpitations    Spironolactone  25 mg daily valsartan -HCTZ 320-25 mg daily Amlodipine  10 mg daily    HISTORY:  Past Medical History:  Past Medical History:  Diagnosis Date   Arthritis    Cataract    bilateral repair   Essential tremor    last 2 years   GERD (gastroesophageal reflux disease)    Hx of colonic polyps    Hypertension    Impaired fasting glucose    Past Surgical History:  Past Surgical History:  Procedure Laterality Date    ABDOMINAL HYSTERECTOMY     CATARACT EXTRACTION W/PHACO Right 07/12/2020   Procedure: CATARACT EXTRACTION PHACO AND INTRAOCULAR LENS PLACEMENT (IOC) RIGHT;  Surgeon: Jaye Fallow, MD;  Location: [facility];  Service: Ophthalmology;  Laterality: Right;  7.35 0:46.4   CATARACT EXTRACTION W/PHACO Left 07/31/2020   Procedure: CATARACT EXTRACTION PHACO AND INTRAOCULAR LENS PLACEMENT (IOC) LEFT;  Surgeon: Jaye Fallow, MD;  Location: [facility];  Service: Ophthalmology;  Laterality: Left;  6.27 0:36.5   CERVICAL DISCECTOMY  1992   2 times   CESAREAN SECTION     2 times   COLONOSCOPY  2016   PACEMAKER IMPLANT N/A 12/12/2021   Procedure: PACEMAKER IMPLANT;  Surgeon: Cindie Ole DASEN, MD;  Location: [facility];  Service: Cardiovascular;  Laterality: N/A;   TUBAL LIGATION      Social History:  reports that she has never smoked. She has been exposed to tobacco smoke. She has never used smokeless tobacco. She reports that she does not currently use alcohol. She reports that she does not use drugs. Family History: family history includes Cancer in her paternal aunt; Colon cancer in her paternal aunt and sister; Diabetes in her brother, brother, and father; Heart disease in her sister; Hypertension in her father and mother.   HOME MEDICATIONS: Allergies as of 06/07/2024       Reactions   Doxazosin     dizziness  Medication List        Accurate as of June 07, 2024 12:48 PM. If you have any questions, ask your nurse or doctor.          acetaminophen  650 MG CR tablet Commonly known as: TYLENOL  Take 1,300 mg by mouth every 8 (eight) hours as needed for pain.   amLODipine  10 MG tablet Commonly known as: NORVASC  Take 1 tablet (10 mg total) by mouth daily.   ascorbic acid 500 MG tablet Commonly known as: VITAMIN C Take 500 mg by mouth daily.   MultiSource Calcium  Mag/D Tabs Take 1 tablet by mouth daily.   multivitamin with minerals Tabs  tablet Take 1 tablet by mouth daily.   omeprazole  20 MG capsule Commonly known as: PRILOSEC Take 1 capsule (20 mg total) by mouth daily.   rosuvastatin  5 MG tablet Commonly known as: CRESTOR  TAKE 1 TABLET BY MOUTH DAILY   spironolactone  25 MG tablet Commonly known as: ALDACTONE  Take 1 tablet (25 mg total) by mouth daily.   valsartan -hydrochlorothiazide  320-25 MG tablet Commonly known as: DIOVAN -HCT Take 1 tablet by mouth daily.          REVIEW OF SYSTEMS: A comprehensive ROS was conducted with the patient and is negative except as per HPI    OBJECTIVE:  VS:BP 128/70 (BP Location: Left Arm, Patient Position: Sitting, Cuff Size: Normal)   Pulse 74   Ht 5' 2 (1.575 m)   Wt 165 lb (74.8 kg)   SpO2 98%   BMI 30.18 kg/m    Wt Readings from Last 3 Encounters:  06/07/24 165 lb (74.8 kg)  02/18/24 166 lb 3.2 oz (75.4 kg)  12/15/23 164 lb (74.4 kg)     EXAM: General: Pt appears well and is in NAD  Lungs: Clear with good BS bilat   Heart: Auscultation: RRR.  Extremities:  BL LE: No pretibial edema   Mental Status: Judgment, insight: Intact Orientation: Oriented to time, place, and person Mood and affect: No depression, anxiety, or agitation     DATA REVIEWED:   Latest Reference Range & Units 06/07/24 12:03  Sodium 135 - 146 mmol/L 137  Potassium 3.5 - 5.3 mmol/L 5.0  Chloride 98 - 110 mmol/L 101  CO2 20 - 32 mmol/L 30  Glucose 65 - 99 mg/dL 874 (H)  BUN 7 - 25 mg/dL 26 (H)  Creatinine 9.39 - 1.00 mg/dL 8.92 (H)  Calcium  8.6 - 10.4 mg/dL 89.9  BUN/Creatinine Ratio 6 - 22 (calc) 24 (H)  eGFR > OR = 60 mL/min/1.8m2 53 (L)     CT abdomen 07/27/2023  FINDINGS: Lower chest: No acute findings.   Hepatobiliary: No mass visualized on this unenhanced exam. Gallbladder is unremarkable. No evidence of biliary ductal dilatation.   Pancreas: No mass or inflammatory process visualized on this unenhanced exam.   Spleen:  Within normal limits in size.    Adrenals/Urinary tract: Adrenal glands are normal in appearance. No adrenal masses are identified.   No evidence of urolithiasis or hydronephrosis. Unremarkable unopacified urinary bladder.   Stomach/Bowel: No evidence of obstruction, inflammatory process, or abnormal fluid collections. Normal appendix visualized. Colonic diverticulosis is noted, without signs of diverticulitis.   Vascular/Lymphatic: No pathologically enlarged lymph nodes identified. No evidence of abdominal aortic aneurysm.   Reproductive:  No mass or other significant abnormality.   Other:  None.   Musculoskeletal:  No suspicious bone lesions identified.   IMPRESSION: No evidence of adrenal neoplasm.   Colonic diverticulosis, without radiographic evidence of  diverticulitis.        ASSESSMENT/PLAN/RECOMMENDATIONS:   Abnormal aldosterone/PRA ratio:  -High suspicion for hyperaldosteronism -Patient not interested in surgical intervention, hence will not proceed with AVS for confirmation. -She felt extremely fatigued and dyspnea when her spironolactone  was increased in the past by different provider, and she is very reluctant in making any adjustments to it.  -CT abdomen showed normal bilateral adrenal glands - BP at goal, no change - BMP shows normal potassium, GFR low but overall stable  Medications : Continue spironolactone  25 mg daily Continue valsartan -HCTZ 320-25 mg daily Continue  amlodipine  10 mg daily     Follow-up in 1 yr    Signed electronically by: Stefano Redgie Butts, MD  [facility] Endocrinology  [facility] [address] [place] [place], [place] [address] Phone: [phone] FAX: [phone]   CC: Jimmy Charlie FERNS, MD [address] [place] [address] Phone: [phone] Fax: [phone]   Return to Endocrinology clinic as below: Future Appointments  Date Time Provider Department Center  06/16/2024  7:05 AM [facility] HVT DEVICE REMOTES  [facility]  09/15/2024  7:05 AM [facility] H[facility] DEVICE REMOTES [facility]  12/14/2024 11:30 AM Alm Delon SAILOR, DO [facility] None  12/15/2024  7:05 AM [facility] HVT DEVICE REMOTES [facility]  03/16/2025  7:05 AM [facility] HVT DEVICE REMOTES [facility]&V  06/07/2025 11:30 AM Finnley Lewis, Donell Redgie, MD [facility] None  06/15/2025  7:05 AM [facility] HVT DEVICE REMOTES [facility]

## 2024-06-08 ENCOUNTER — Ambulatory Visit: Payer: Self-pay | Admitting: Internal Medicine

## 2024-06-12 LAB — BASIC METABOLIC PANEL WITH GFR
BUN/Creatinine Ratio: 24 (calc) — ABNORMAL HIGH (ref 6–22)
BUN: 26 mg/dL — ABNORMAL HIGH (ref 7–25)
CO2: 30 mmol/L (ref 20–32)
Calcium: 10 mg/dL (ref 8.6–10.4)
Chloride: 101 mmol/L (ref 98–110)
Creat: 1.07 mg/dL — ABNORMAL HIGH (ref 0.60–1.00)
Glucose, Bld: 125 mg/dL — ABNORMAL HIGH (ref 65–99)
Potassium: 5 mmol/L (ref 3.5–5.3)
Sodium: 137 mmol/L (ref 135–146)
eGFR: 53 mL/min/1.73m2 — ABNORMAL LOW (ref >=60)

## 2024-06-12 LAB — RENIN: Renin Activity: 9.11 ng/mL/h — ABNORMAL HIGH (ref 0.25–5.82)

## 2024-06-16 ENCOUNTER — Ambulatory Visit

## 2024-06-16 DIAGNOSIS — I442 Atrioventricular block, complete: Secondary | ICD-10-CM | POA: Diagnosis not present

## 2024-06-16 LAB — CUP PACEART REMOTE DEVICE CHECK
Battery Remaining Longevity: 126 mo
Battery Voltage: 3.01 V
Brady Statistic AP VP Percent: 1.26 %
Brady Statistic AP VS Percent: 0 %
Brady Statistic AS VP Percent: 98.7 %
Brady Statistic AS VS Percent: 0.04 %
Brady Statistic RA Percent Paced: 1.26 %
Brady Statistic RV Percent Paced: 99.96 %
Date Time Interrogation Session: 20250814031825
Implantable Lead Connection Status: 753985
Implantable Lead Connection Status: 753985
Implantable Lead Implant Date: 20230209
Implantable Lead Implant Date: 20230209
Implantable Lead Location: 753859
Implantable Lead Location: 753860
Implantable Lead Model: 3830
Implantable Lead Model: 5076
Implantable Pulse Generator Implant Date: 20230209
Lead Channel Impedance Value: 418 Ohm
Lead Channel Impedance Value: 475 Ohm
Lead Channel Impedance Value: 494 Ohm
Lead Channel Impedance Value: 589 Ohm
Lead Channel Pacing Threshold Amplitude: 0.25 V
Lead Channel Pacing Threshold Amplitude: 0.75 V
Lead Channel Pacing Threshold Pulse Width: 0.4 ms
Lead Channel Pacing Threshold Pulse Width: 0.4 ms
Lead Channel Sensing Intrinsic Amplitude: 12.125 mV
Lead Channel Sensing Intrinsic Amplitude: 13 mV
Lead Channel Sensing Intrinsic Amplitude: 2.125 mV
Lead Channel Sensing Intrinsic Amplitude: 2.125 mV
Lead Channel Setting Pacing Amplitude: 1.5 V
Lead Channel Setting Pacing Amplitude: 2 V
Lead Channel Setting Pacing Pulse Width: 0.4 ms
Lead Channel Setting Sensing Sensitivity: 1.2 mV
Zone Setting Status: 755011

## 2024-06-17 ENCOUNTER — Ambulatory Visit: Payer: Self-pay | Admitting: Cardiology

## 2024-07-28 NOTE — Progress Notes (Signed)
 Remote PPM Transmission

## 2024-08-10 ENCOUNTER — Ambulatory Visit: Attending: Internal Medicine | Admitting: Internal Medicine

## 2024-08-10 ENCOUNTER — Encounter: Payer: Self-pay | Admitting: Internal Medicine

## 2024-08-10 VITALS — BP 124/68 | HR 70 | Ht 62.0 in | Wt 166.0 lb

## 2024-08-10 DIAGNOSIS — Z950 Presence of cardiac pacemaker: Secondary | ICD-10-CM

## 2024-08-10 DIAGNOSIS — E269 Hyperaldosteronism, unspecified: Secondary | ICD-10-CM | POA: Diagnosis not present

## 2024-08-10 DIAGNOSIS — E785 Hyperlipidemia, unspecified: Secondary | ICD-10-CM | POA: Diagnosis not present

## 2024-08-10 DIAGNOSIS — I152 Hypertension secondary to endocrine disorders: Secondary | ICD-10-CM | POA: Diagnosis not present

## 2024-08-10 DIAGNOSIS — I700 Atherosclerosis of aorta: Secondary | ICD-10-CM | POA: Diagnosis not present

## 2024-08-10 DIAGNOSIS — I442 Atrioventricular block, complete: Secondary | ICD-10-CM | POA: Diagnosis not present

## 2024-08-10 NOTE — Progress Notes (Signed)
 Cardiology Office Note:  .   Date:  08/10/2024  ID:  Kathy Acosta, DOB 05-29-1946, MRN [id number] PCP: Kathy Charlie FERNS, MD  [facility] Providers Cardiologist:  Kathy Hanson, MD Electrophysiologist:  Kathy ONEIDA HOLTS, MD     History of Present Illness: .   Kathy Acosta is a 78 y.o. 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  I last saw her in April, at which time she was feeling well.  Blood pressures were borderline elevated following dose reduction of amlodipine  at her prior endocrinology visit.  We elected to increase it back to 10 mg daily, as she did not tolerate higher doses of spironolactone  due to fatigue.  She was last seen by her endocrinologist in August, at which time she was continued on her current medications; additional testing was deferred.  Today, Kathy Acosta reports that she has been feeling well, denying chest pain, shortness of breath, palpitations, lightheadedness, edema.  She sometimes feels a bit tired, though the next day she feels back to normal.  She does not exercise regularly but remains very active at home.  Home blood pressures have been fairly well-controlled with only a few readings into the 130s systolic.  ROS: See HPI  Studies Reviewed: SABRA   EKG Interpretation Date/Time:  Wednesday August 10 2024 13:27:20 EDT Ventricular Rate:  70 PR Interval:  156 QRS Duration:  142 QT Interval:  420 QTC Calculation: 453 R Axis:   -58  Text Interpretation: Atrial-sensed ventricular-paced rhythm Abnormal ECG When compared with ECG of 18-Feb-2024 08:20, No significant change was found Confirmed by Kathy Acosta [phone]) on 08/10/2024 1:31:00 PM    TTE (06/09/2023): Normal LV size and wall thickness.  LVEF 55-6% with normal wall motion.  Grade 1 diastolic dysfunction.   Global longitudinal strain -17.1%.  Normal RV size and function.  Normal biatrial size.  No pericardial effusion.  Degenerative mitral valve with moderate to severe annular calcification.  No regurgitation or stenosis.  Mild tricuspid regurgitation.  Borderline dilation of the ascending aorta, measuring 3.8 cm.  Normal CVP.  Risk Assessment/Calculations:             Physical Exam:   VS:  BP 124/68 (BP Location: Left Arm, Patient Position: Sitting, Cuff Size: Normal)   Pulse 70   Ht 5' 2 (1.575 m)   Wt 166 lb (75.3 kg)   SpO2 97%   BMI 30.36 kg/m    Wt Readings from Last 3 Encounters:  08/10/24 166 lb (75.3 kg)  06/07/24 165 lb (74.8 kg)  02/18/24 166 lb 3.2 oz (75.4 kg)    General:  NAD. Neck: No JVD or HJR. Lungs: Clear to auscultation bilaterally without wheezes or crackles. Heart: Regular rate and rhythm without murmurs, rubs, or gallops. Abdomen: Soft, nontender, nondistended. Extremities: No lower extremity edema.  ASSESSMENT AND PLAN: .    Complete heart block status post pacemaker: EKG today shows atrially sensed, ventricularly paced rhythm.  No recurrent symptoms of high-grade AV block following PPM implantation.  Continue EP/device clinic follow-up.  Hypertension secondary to hyperaldosteronism: Blood pressure well-controlled.  Continue current regimen of amlodipine , spironolactone , and valsartan -HCTZ.  BMP in August showed stable renal function and electrolytes.  Continue ongoing follow-up with endocrinology.  Hyperlipidemia and aortic atherosclerosis: Continue rosuvastatin  with ongoing management per her new PCP that she will be establishing with in January, given  Dr. Marval recent retirement.    Dispo: Return to clinic in 6 months.  Signed, Kathy Hanson, MD

## 2024-08-10 NOTE — Patient Instructions (Signed)

## 2024-09-15 ENCOUNTER — Ambulatory Visit (INDEPENDENT_AMBULATORY_CARE_PROVIDER_SITE_OTHER)

## 2024-09-15 DIAGNOSIS — I442 Atrioventricular block, complete: Secondary | ICD-10-CM

## 2024-09-16 ENCOUNTER — Ambulatory Visit: Payer: Self-pay | Admitting: Cardiology

## 2024-09-16 LAB — CUP PACEART REMOTE DEVICE CHECK
Battery Remaining Longevity: 123 mo
Battery Voltage: 3.01 V
Brady Statistic AP VP Percent: 0.63 %
Brady Statistic AP VS Percent: 0 %
Brady Statistic AS VP Percent: 99.35 %
Brady Statistic AS VS Percent: 0.02 %
Brady Statistic RA Percent Paced: 0.63 %
Brady Statistic RV Percent Paced: 99.98 %
Date Time Interrogation Session: 20251112232135
Implantable Lead Connection Status: 753985
Implantable Lead Connection Status: 753985
Implantable Lead Implant Date: 20230209
Implantable Lead Implant Date: 20230209
Implantable Lead Location: 753859
Implantable Lead Location: 753860
Implantable Lead Model: 3830
Implantable Lead Model: 5076
Implantable Pulse Generator Implant Date: 20230209
Lead Channel Impedance Value: 418 Ohm
Lead Channel Impedance Value: 456 Ohm
Lead Channel Impedance Value: 475 Ohm
Lead Channel Impedance Value: 589 Ohm
Lead Channel Pacing Threshold Amplitude: 0.25 V
Lead Channel Pacing Threshold Amplitude: 0.75 V
Lead Channel Pacing Threshold Pulse Width: 0.4 ms
Lead Channel Pacing Threshold Pulse Width: 0.4 ms
Lead Channel Sensing Intrinsic Amplitude: 12.125 mV
Lead Channel Sensing Intrinsic Amplitude: 13 mV
Lead Channel Sensing Intrinsic Amplitude: 2 mV
Lead Channel Sensing Intrinsic Amplitude: 2 mV
Lead Channel Setting Pacing Amplitude: 1.5 V
Lead Channel Setting Pacing Amplitude: 2 V
Lead Channel Setting Pacing Pulse Width: 0.4 ms
Lead Channel Setting Sensing Sensitivity: 1.2 mV
Zone Setting Status: 755011

## 2024-09-20 NOTE — Progress Notes (Signed)
 Remote PPM Transmission

## 2024-10-12 ENCOUNTER — Ambulatory Visit: Admitting: Internal Medicine

## 2024-11-28 ENCOUNTER — Other Ambulatory Visit: Payer: Self-pay | Admitting: Internal Medicine

## 2024-11-29 ENCOUNTER — Ambulatory Visit

## 2024-11-29 VITALS — Ht 62.0 in | Wt 166.0 lb

## 2024-11-29 DIAGNOSIS — Z0000 Encounter for general adult medical examination without abnormal findings: Secondary | ICD-10-CM

## 2024-11-29 NOTE — Patient Instructions (Signed)
 Kathy Acosta,  Thank you for taking the time for your Medicare Wellness Visit. I appreciate your continued commitment to your health goals. Please review the care plan we discussed, and feel free to reach out if I can assist you further.  Please note that Annual Wellness Visits do not include a physical exam. Some assessments may be limited, especially if the visit was conducted virtually. If needed, we may recommend an in-person follow-up with your provider.  Ongoing Care Seeing your primary care provider every 3 to 6 months helps us  monitor your health and provide consistent, personalized care.   Referrals If a referral was made during today's visit and you haven't received any updates within two weeks, please contact the referred provider directly to check on the status.  Recommended Screenings:  Health Maintenance  Topic Date Due   Flu Shot  06/03/2024   COVID-19 Vaccine (4 - 2025-26 season) 07/04/2024   Medicare Annual Wellness Visit  12/14/2024   DTaP/Tdap/Td vaccine (3 - Td or Tdap) 06/06/2029   Pneumococcal Vaccine for age over 78  Completed   Osteoporosis screening with Bone Density Scan  Completed   Zoster (Shingles) Vaccine  Completed   Meningitis B Vaccine  Aged Out   Breast Cancer Screening  Discontinued   Colon Cancer Screening  Discontinued       12/12/2021   10:15 AM  Advanced Directives  Does Patient Have a Medical Advance Directive? Yes  Type of Estate Agent of [place];Living will  Does patient want to make changes to medical advance directive? No - Patient declined  Copy of Healthcare Power of Attorney in Chart? No - copy requested    Vision: Annual vision screenings are recommended for early detection of glaucoma, cataracts, and diabetic retinopathy. These exams can also reveal signs of chronic conditions such as diabetes and high blood pressure.  Dental: Annual dental screenings help detect early signs of oral cancer, gum disease, and  other conditions linked to overall health, including heart disease and diabetes.  Please see the attached documents for additional preventive care recommendations.

## 2024-11-29 NOTE — Progress Notes (Signed)
 "  Chief Complaint  Patient presents with   Medicare Wellness     Subjective:   Kathy Acosta is a 79 y.o. female who presents for a Medicare Annual Wellness Visit.  Visit info / Clinical Intake: Medicare Wellness Visit Type:: Subsequent Annual Wellness Visit Persons participating in visit and providing information:: patient Medicare Wellness Visit Mode:: Telephone If telephone:: video declined Since this visit was completed virtually, some vitals may be partially provided or unavailable. Missing vitals are due to the limitations of the virtual format.: Unable to obtain vitals - no equipment If Telephone or Video please confirm:: I connected with patient using audio/video enable telemedicine. I verified patient identity with two identifiers, discussed telehealth limitations, and patient agreed to proceed. Patient Location:: home Provider Location:: home office Interpreter Needed?: No Pre-visit prep was completed: yes AWV questionnaire completed by patient prior to visit?: no Living arrangements:: lives with spouse/significant other Patient's Overall Health Status Rating: good Typical amount of pain: none Does pain affect daily life?: no Are you currently prescribed opioids?: no  Dietary Habits and Nutritional Risks How many meals a day?: 2 Eats fruit and vegetables daily?: yes Most meals are obtained by: preparing own meals In the last 2 weeks, have you had any of the following?: none Diabetic:: no  Functional Status Activities of Daily Living (to include ambulation/medication): Independent Ambulation: Independent Medication Administration: Independent Home Management (perform basic housework or laundry): Independent Manage your own finances?: yes Primary transportation is: driving Concerns about vision?: no *vision screening is required for WTM* Concerns about hearing?: no  Fall Screening Falls in the past year?: 0 Number of falls in past year: 0 Was there an injury  with Fall?: 0 Fall Risk Category Calculator: 0 Patient Fall Risk Level: Low Fall Risk  Fall Risk Patient at Risk for Falls Due to: No Fall Risks Fall risk Follow up: Falls evaluation completed; Education provided; Falls prevention discussed  Home and Transportation Safety: All rugs have non-skid backing?: yes All stairs or steps have railings?: yes Grab bars in the bathtub or shower?: (!) no (in BR:has walk in shower) Have non-skid surface in bathtub or shower?: yes Good home lighting?: yes Regular seat belt use?: yes Hospital stays in the last year:: no  Cognitive Assessment Difficulty concentrating, remembering, or making decisions? : no Will 6CIT or Mini Cog be Completed: yes What year is it?: 0 points What month is it?: 0 points Give patient an address phrase to remember (5 components): [address] [place]  About what time is it?: 0 points Count backwards from 20 to 1: 0 points Say the months of the year in reverse: 0 points Repeat the address phrase from earlier: 0 points 6 CIT Score: 0 points  Advance Directives (For Healthcare) Does Patient Have a Medical Advance Directive?: Yes Does patient want to make changes to medical advance directive?: No - Patient declined Type of Advance Directive: Healthcare Power of [place]; Living will Copy of Healthcare Power of Attorney in Chart?: No - copy requested Copy of Living Will in Chart?: No - copy requested  Reviewed/Updated  Reviewed/Updated: Reviewed All (Medical, Surgical, Family, Medications, Allergies, Care Teams, Patient Goals)    Allergies (verified) Doxazosin    Current Medications (verified) Outpatient Encounter Medications as of 11/29/2024  Medication Sig   acetaminophen  (TYLENOL ) 650 MG CR tablet Take 1,300 mg by mouth every 8 (eight) hours as needed for pain.   amLODipine  (NORVASC ) 10 MG tablet TAKE 1 TABLET BY MOUTH DAILY   ascorbic acid (  VITAMIN C) 500 MG tablet Take 500 mg by mouth daily.    Multiple Minerals-Vitamins (MULTISOURCE CALCIUM  MAG/D) TABS Take 1 tablet by mouth daily.   Multiple Vitamin (MULTIVITAMIN WITH MINERALS) TABS tablet Take 1 tablet by mouth daily.   omeprazole  (PRILOSEC) 20 MG capsule Take 1 capsule (20 mg total) by mouth daily.   rosuvastatin  (CRESTOR ) 5 MG tablet TAKE 1 TABLET BY MOUTH DAILY   spironolactone  (ALDACTONE ) 25 MG tablet Take 1 tablet (25 mg total) by mouth daily.   valsartan -hydrochlorothiazide  (DIOVAN -HCT) 320-25 MG tablet TAKE 1 TABLET BY MOUTH DAILY   [DISCONTINUED] amLODipine  (NORVASC ) 10 MG tablet Take 1 tablet (10 mg total) by mouth daily.   [DISCONTINUED] valsartan -hydrochlorothiazide  (DIOVAN -HCT) 320-25 MG tablet Take 1 tablet by mouth daily.   No facility-administered encounter medications on file as of 11/29/2024.    History: Past Medical History:  Diagnosis Date   Arthritis    Cataract    bilateral repair   Essential tremor    last 2 years   GERD (gastroesophageal reflux disease)    Hx of colonic polyps    Hypertension    Impaired fasting glucose    Past Surgical History:  Procedure Laterality Date   ABDOMINAL HYSTERECTOMY     CATARACT EXTRACTION W/PHACO Right 07/12/2020   Procedure: CATARACT EXTRACTION PHACO AND INTRAOCULAR LENS PLACEMENT (IOC) RIGHT;  Surgeon: Jaye Fallow, MD;  Location: [facility];  Service: Ophthalmology;  Laterality: Right;  7.35 0:46.4   CATARACT EXTRACTION W/PHACO Left 07/31/2020   Procedure: CATARACT EXTRACTION PHACO AND INTRAOCULAR LENS PLACEMENT (IOC) LEFT;  Surgeon: Jaye Fallow, MD;  Location: [facility];  Service: Ophthalmology;  Laterality: Left;  6.27 0:36.5   CERVICAL DISCECTOMY  1992   2 times   CESAREAN SECTION     2 times   COLONOSCOPY  2016   PACEMAKER IMPLANT N/A 12/12/2021   Procedure: PACEMAKER IMPLANT;  Surgeon: Cindie Ole DASEN, MD;  Location: [facility];  Service: Cardiovascular;  Laterality: N/A;   TUBAL LIGATION     Family History   Problem Relation Age of Onset   Hypertension Mother    Hypertension Father    Diabetes Father    Diabetes Brother    Cancer Paternal Aunt        colon   Colon cancer Paternal Aunt    Diabetes Brother    Heart disease Sister    Colon cancer Sister    Colon polyps Neg Hx    Esophageal cancer Neg Hx    Stomach cancer Neg Hx    Rectal cancer Neg Hx    Social History   Occupational History   Occupation: retired- Geologist, [employer]  Tobacco Use   Smoking status: Never    Passive exposure: Past   Smokeless tobacco: Never   Tobacco comments:    NEVER SMOKED!  Vaping Use   Vaping status: Never Used  Substance and Sexual Activity   Alcohol use: Not Currently    Comment: glass of wine - once a month   Drug use: No   Sexual activity: Not Currently    Birth control/protection: Surgical   Tobacco Counseling Counseling given: Not Answered Tobacco comments: NEVER SMOKED!  SDOH Screenings   Food Insecurity: No Food Insecurity (11/29/2024)  Housing: Unknown (11/29/2024)  Transportation Needs: No Transportation Needs (11/29/2024)  Utilities: Not At Risk (11/29/2024)  Depression (PHQ2-9): Low Risk (11/29/2024)  Physical Activity: Inactive (11/29/2024)  Social Connections: Socially Integrated (11/29/2024)  Stress: No Stress Concern Present (11/29/2024)  Tobacco Use: Low Risk (11/29/2024)  Health Literacy: Adequate Health Literacy (11/29/2024)   See flowsheets for full screening details  Depression Screen PHQ 2 & 9 Depression Scale- Over the past 2 weeks, how often have you been bothered by any of the following problems? Little interest or pleasure in doing things: 0 Feeling down, depressed, or hopeless (PHQ Adolescent also includes...irritable): 0 PHQ-2 Total Score: 0 Trouble falling or staying asleep, or sleeping too much: 0 Feeling tired or having little energy: 0 Poor appetite or overeating (PHQ Adolescent also includes...weight loss): 0 Feeling bad about yourself -  or that you are a failure or have let yourself or your family down: 0 Trouble concentrating on things, such as reading the newspaper or watching television (PHQ Adolescent also includes...like school work): 0 Moving or speaking so slowly that other people could have noticed. Or the opposite - being so fidgety or restless that you have been moving around a lot more than usual: 0 Thoughts that you would be better off dead, or of hurting yourself in some way: 0 PHQ-9 Total Score: 0 If you checked off any problems, how difficult have these problems made it for you to do your work, take care of things at home, or get along with other people?: Not difficult at all     Goals Addressed             This Visit's Progress    i would like to keep my blood pressure under control               Objective:    Today's Vitals   11/29/24 1021  Weight: 166 lb (75.3 kg)  Height: 5' 2 (1.575 m)   Body mass index is 30.36 kg/m.  Hearing/Vision screen No results found. Immunizations and Health Maintenance Health Maintenance  Topic Date Due   Influenza Vaccine  06/03/2024   COVID-19 Vaccine (4 - 2025-26 season) 07/04/2024   Medicare Annual Wellness (AWV)  12/14/2024   DTaP/Tdap/Td (3 - Td or Tdap) 06/06/2029   Pneumococcal Vaccine: 50+ Years  Completed   Bone Density Scan  Completed   Zoster Vaccines- Shingrix  Completed   Meningococcal B Vaccine  Aged Out   Mammogram  Discontinued   Colonoscopy  Discontinued        Assessment/Plan:  This is a routine wellness examination for [place].  Patient Care Team: Jimmy Charlie FERNS, MD as PCP - General Cindie Ole DASEN, MD (Inactive) as PCP - Electrophysiology (Cardiology) End, Lonni, MD as PCP - Cardiology (Cardiology) Pa, [facility])  I have personally reviewed and noted the following in the patients chart:   Medical and social history Use of alcohol, tobacco or illicit drugs  Current medications and  supplements including opioid prescriptions. Functional ability and status Nutritional status Physical activity Advanced directives List of other physicians Hospitalizations, surgeries, and ER visits in previous 12 months Vitals Screenings to include cognitive, depression, and falls Referrals and appointments  No orders of the defined types were placed in this encounter.  In addition, I have reviewed and discussed with patient certain preventive protocols, quality metrics, and best practice recommendations. A written personalized care plan for preventive services as well as general preventive health recommendations were provided to patient.   Erminio LITTIE Saris, LPN   [id number]/72/[id number]    After Visit Summary: (MyChart) Due to this being a telephonic visit, the after visit summary with patients personalized plan was offered to patient via MyChart   Nurse Notes:  No voiced or noted concerns at this time Patient advised to keep follow-up appointment with PCP (12/15/24) Appointment(s) made: (AWV/CPE Feb 2027) HM Addressed: Pt indicates she received from [facility] Covid,Influenza abd RSV in Sept and October 2025  "

## 2024-12-14 ENCOUNTER — Ambulatory Visit: Admitting: Dermatology

## 2024-12-15 ENCOUNTER — Encounter

## 2025-03-16 ENCOUNTER — Encounter

## 2025-06-07 ENCOUNTER — Ambulatory Visit: Admitting: Internal Medicine

## 2025-06-15 ENCOUNTER — Encounter

## 2025-12-18 ENCOUNTER — Encounter

## 2025-12-18 ENCOUNTER — Ambulatory Visit
